# Patient Record
Sex: Female | Born: 1985 | Race: Black or African American | Hispanic: No | Marital: Married | Smoking: Former smoker
Health system: Southern US, Community
[De-identification: ages and names within clinical notes are randomized; demographics above are authoritative.]

## PROBLEM LIST (undated history)

## (undated) DIAGNOSIS — R7303 Prediabetes: Secondary | ICD-10-CM

## (undated) DIAGNOSIS — R519 Headache, unspecified: Secondary | ICD-10-CM

## (undated) DIAGNOSIS — I209 Angina pectoris, unspecified: Secondary | ICD-10-CM

## (undated) DIAGNOSIS — E0590 Thyrotoxicosis, unspecified without thyrotoxic crisis or storm: Secondary | ICD-10-CM

## (undated) DIAGNOSIS — R51 Headache: Secondary | ICD-10-CM

## (undated) DIAGNOSIS — J40 Bronchitis, not specified as acute or chronic: Secondary | ICD-10-CM

## (undated) DIAGNOSIS — J309 Allergic rhinitis, unspecified: Secondary | ICD-10-CM

## (undated) DIAGNOSIS — Z8719 Personal history of other diseases of the digestive system: Secondary | ICD-10-CM

## (undated) DIAGNOSIS — G709 Myoneural disorder, unspecified: Secondary | ICD-10-CM

## (undated) DIAGNOSIS — R0600 Dyspnea, unspecified: Secondary | ICD-10-CM

## (undated) HISTORY — PX: DILATION AND CURETTAGE OF UTERUS: SHX78

## (undated) HISTORY — DX: Allergic rhinitis, unspecified: J30.9

## (undated) HISTORY — PX: WISDOM TOOTH EXTRACTION: SHX21

---

## 2005-04-06 ENCOUNTER — Emergency Department (HOSPITAL_COMMUNITY): Admission: EM | Admit: 2005-04-06 | Discharge: 2005-04-06 | Payer: Self-pay | Admitting: Emergency Medicine

## 2006-08-24 ENCOUNTER — Emergency Department (HOSPITAL_COMMUNITY): Admission: EM | Admit: 2006-08-24 | Discharge: 2006-08-24 | Payer: Self-pay | Admitting: Emergency Medicine

## 2007-04-05 ENCOUNTER — Emergency Department (HOSPITAL_COMMUNITY): Admission: EM | Admit: 2007-04-05 | Discharge: 2007-04-05 | Payer: Self-pay | Admitting: Nurse Practitioner

## 2008-02-29 ENCOUNTER — Emergency Department (HOSPITAL_COMMUNITY): Admission: EM | Admit: 2008-02-29 | Discharge: 2008-02-29 | Payer: Self-pay | Admitting: Emergency Medicine

## 2009-12-13 IMAGING — CR DG CHEST 2V
2 series · 2 of 2 positions shown · non-contrast
Comparison: None available.

CLINICAL DATA: Chest pain.

CHEST - 2 VIEW

[w chest pa]
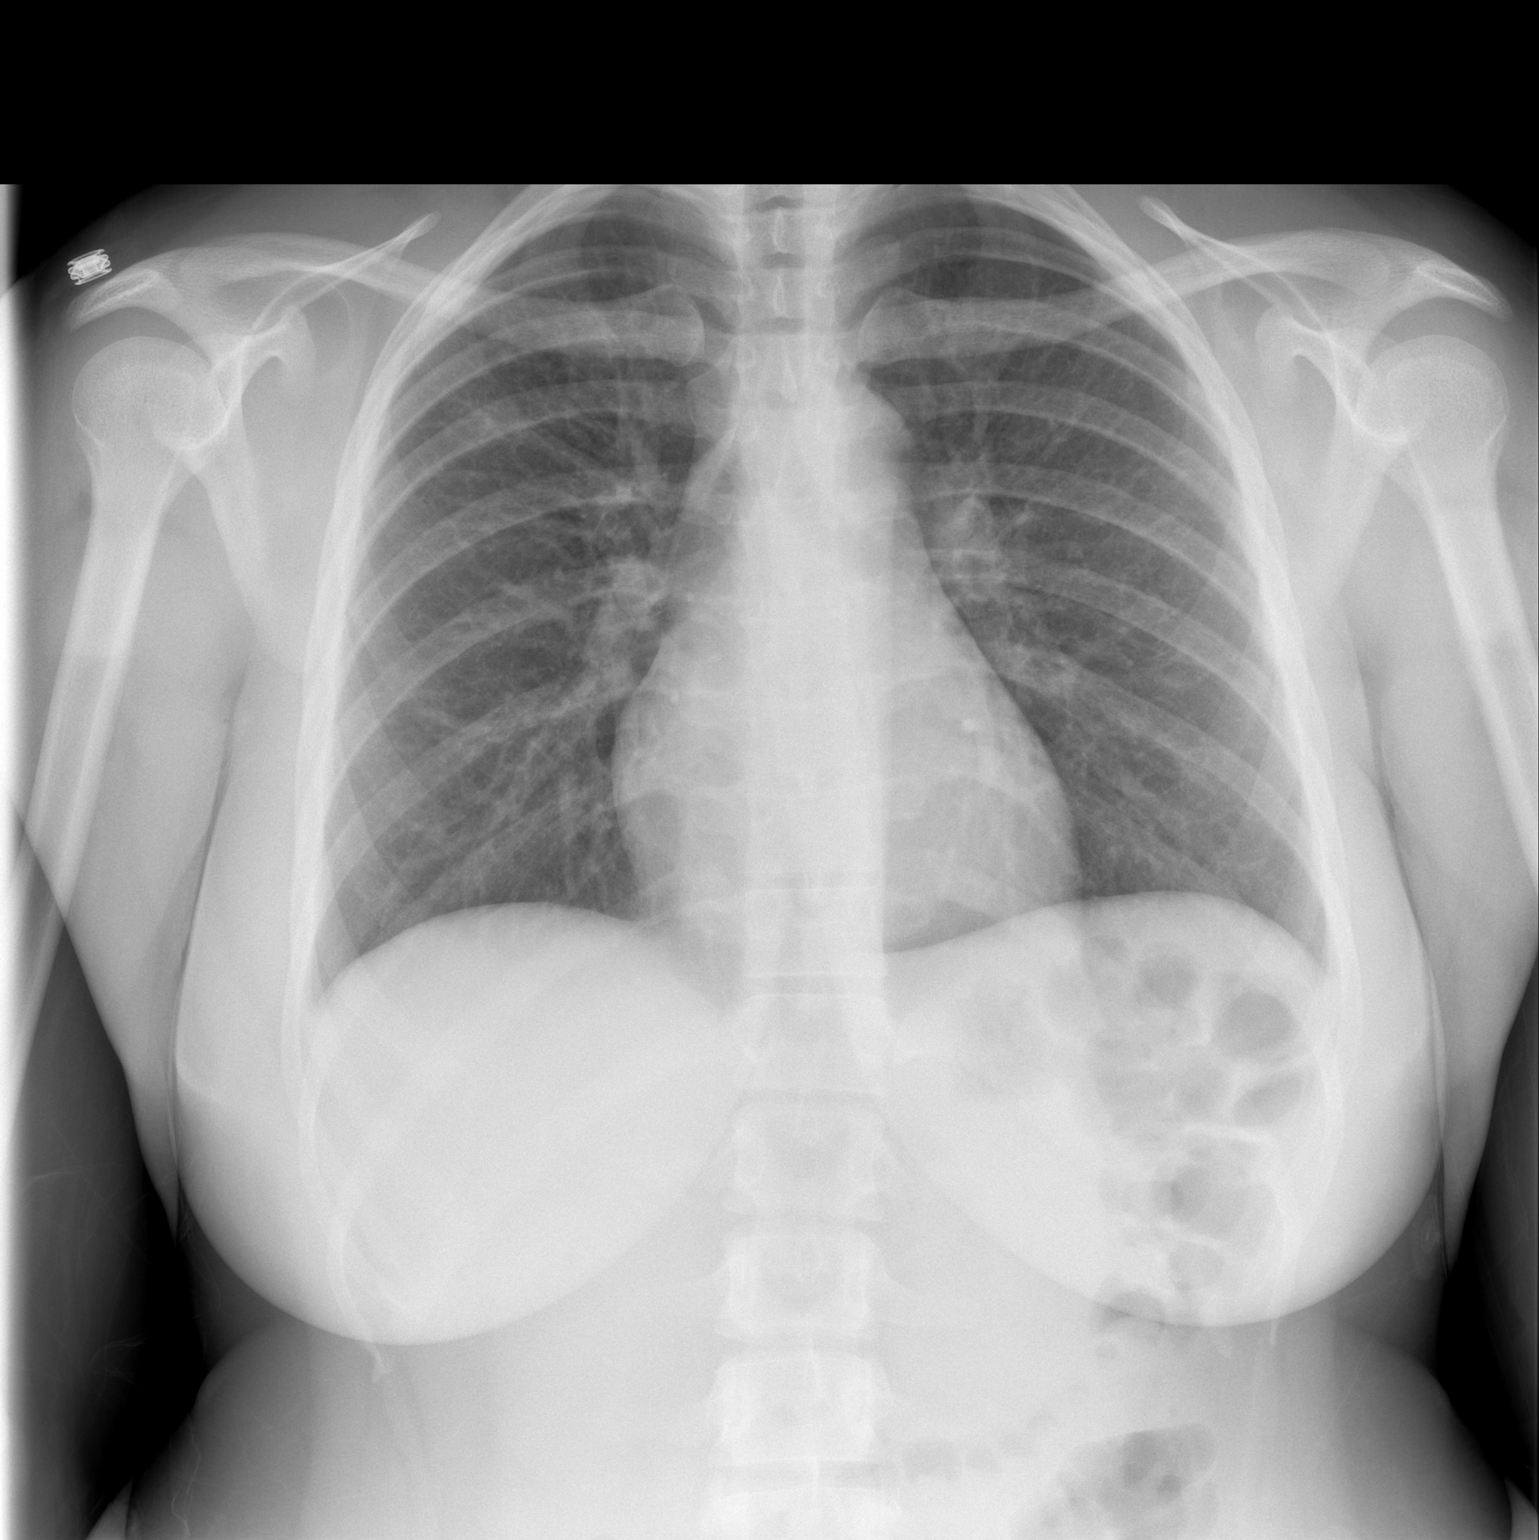

[w chest lat]
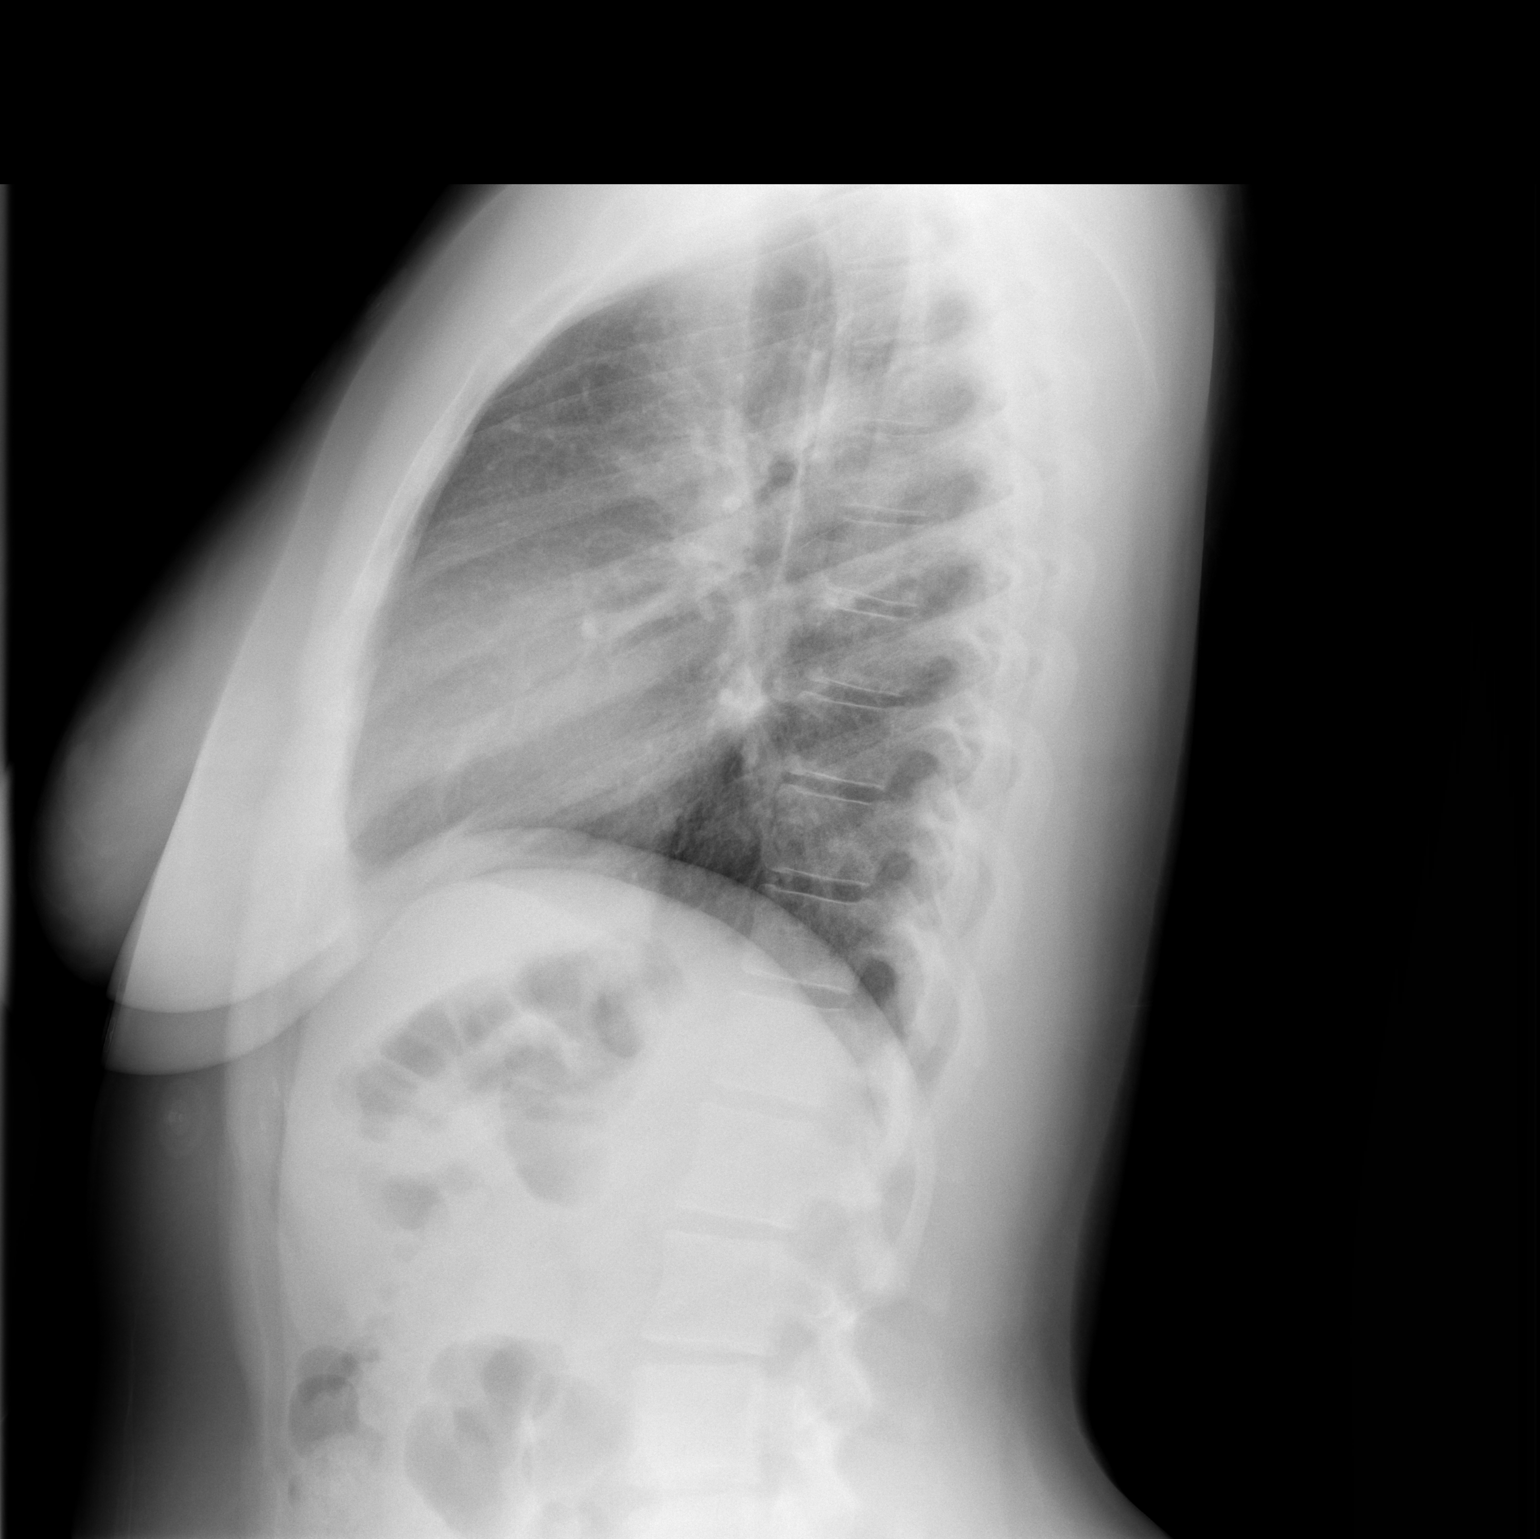

[2 of 2 positions shown; findings below may reference images not displayed]

FINDINGS: The lungs are clear.  Heart size is normal.  There is no
pleural effusion or focal bony abnormality.
IMPRESSION: No acute disease.

## 2010-12-19 LAB — POCT CARDIAC MARKERS
CKMB, poc: <1 ng/mL — ABNORMAL LOW (ref 1.0–8.0)
Myoglobin, poc: 63.9 ng/mL (ref 12–200)
Troponin i, poc: <0.05 ng/mL (ref 0.00–0.09)

## 2010-12-19 LAB — POCT PREGNANCY, URINE: Preg Test, Ur: NEGATIVE

## 2011-01-01 LAB — RAPID STREP SCREEN (MED CTR MEBANE ONLY): Streptococcus, Group A Screen (Direct): NEGATIVE

## 2012-12-09 ENCOUNTER — Emergency Department (HOSPITAL_COMMUNITY)
Admission: EM | Admit: 2012-12-09 | Discharge: 2012-12-09 | Discharge status: Against Medical Advice | Payer: Self-pay | Attending: Emergency Medicine | Admitting: Emergency Medicine

## 2012-12-09 ENCOUNTER — Encounter (HOSPITAL_COMMUNITY): Payer: Self-pay | Admitting: *Deleted

## 2012-12-09 DIAGNOSIS — M79609 Pain in unspecified limb: Secondary | ICD-10-CM | POA: Insufficient documentation

## 2012-12-09 DIAGNOSIS — F17200 Nicotine dependence, unspecified, uncomplicated: Secondary | ICD-10-CM | POA: Insufficient documentation

## 2012-12-09 DIAGNOSIS — IMO0002 Reserved for concepts with insufficient information to code with codable children: Secondary | ICD-10-CM | POA: Insufficient documentation

## 2012-12-09 NOTE — ED Notes (Signed)
The pt has had soreness under her rt arm for 3 weeks.  She thinks it is a boil

## 2012-12-09 NOTE — ED Notes (Signed)
The pt does not answer 

## 2012-12-10 ENCOUNTER — Emergency Department (HOSPITAL_COMMUNITY)
Admission: EM | Admit: 2012-12-10 | Discharge: 2012-12-10 | Disposition: A | Discharge status: Home / Self Care | Payer: Self-pay | Attending: Emergency Medicine | Admitting: Emergency Medicine

## 2012-12-10 ENCOUNTER — Encounter (HOSPITAL_COMMUNITY): Payer: Self-pay | Admitting: Emergency Medicine

## 2012-12-10 DIAGNOSIS — L02411 Cutaneous abscess of right axilla: Secondary | ICD-10-CM

## 2012-12-10 MED ORDER — SULFAMETHOXAZOLE-TRIMETHOPRIM 800-160 MG PO TABS: 1.0000 | ORAL_TABLET | Freq: Two times a day (BID) | ORAL | Status: DC

## 2012-12-10 MED ORDER — LIDOCAINE-EPINEPHRINE (PF) 2 %-1:200000 IJ SOLN
10.0000 mL | Freq: Once | INTRAMUSCULAR | Status: AC
  Administered 2012-12-10: 10 mL via INTRADERMAL
  Filled 2012-12-10: qty 10

## 2012-12-10 MED ORDER — HYDROCODONE-ACETAMINOPHEN 5-325 MG PO TABS: 1.0000 | ORAL_TABLET | ORAL | Status: DC | PRN

## 2012-12-10 NOTE — ED Notes (Signed)
Patient is alert and oriented x3.  She is complaining of right axillary tenderness with a small mass.  She states that she has had the mass for a few weeks.

## 2012-12-10 NOTE — ED Provider Notes (Signed)
CSN: [id number]     Arrival date & time 12/09/12  2356 History   First MD Initiated Contact with Patient 12/10/12 [phone]     Chief Complaint  Patient presents with  . Cyst   HPI  History provided by the patient. Patient is a 27 year old female with no significant PMH presents with complaints of pain and swelling to the right armpit area. She first noticed a slight lump with slight tenderness 3 weeks ago. This gradually increased in size. He did shrink her for a few days but now over the last 4 days has been much worse and more painful. Pain is worse with any palpation. She seen any bleeding or drainage from the area. She denies similar symptoms previously. No fever, chills or sweats. No other treatments used. No other aggravating or alleviating factors. No other associated symptoms.   History reviewed. No pertinent past medical history. History reviewed. No pertinent past surgical history. No family history on file. History  Substance Use Topics  . Smoking status: Current Every Day Smoker -- 2.00 packs/day    Types: Cigars  . Smokeless tobacco: Never Used  . Alcohol Use: Yes     Comment: occasionally   OB History   Grav Para Term Preterm Abortions TAB SAB Ect Mult Living                 Review of Systems  Constitutional: Negative for fever, chills and diaphoresis.  All other systems reviewed and are negative.    Allergies  Review of patient's allergies indicates no known allergies.  Home Medications   Current Outpatient Rx  Name  Route  Sig  Dispense  Refill  . OVER THE COUNTER MEDICATION   Oral   Take 1 tablet by mouth 2 (two) times daily. lipozene dietary supplment         . HYDROcodone-acetaminophen (NORCO/VICODIN) 5-325 MG per tablet   Oral   Take 1-2 tablets by mouth every 4 (four) hours as needed for pain.   10 tablet   0   . sulfamethoxazole-trimethoprim (SEPTRA DS) 800-160 MG per tablet   Oral   Take 1 tablet by mouth 2 (two) times daily.   14 tablet    0    BP 119/77  Pulse 67  Temp(Src) 98.4 F (36.9 C) (Oral)  Resp 18  Ht 5\' 5"  (1.651 m)  Wt 220 lb (99.791 kg)  BMI 36.61 kg/m2  SpO2 100% Physical Exam  Nursing note and vitals reviewed. Constitutional: She is oriented to person, place, and time. She appears well-developed and well-nourished. No distress.  HENT:  Head: Normocephalic.  Cardiovascular: Normal rate and regular rhythm.   Pulmonary/Chest: Effort normal and breath sounds normal.  Musculoskeletal: Normal range of motion.  Neurological: She is alert and oriented to person, place, and time.  Skin: Skin is warm and dry. No rash noted.  2 cm tender nodule to the right axilla.  Psychiatric: She has a normal mood and affect. Her behavior is normal.    ED Course  Procedures  INCISION AND DRAINAGE Performed by: Angus Seller Consent: Verbal consent obtained. Risks and benefits: risks, benefits and alternatives were discussed Type: abscess  Body area: Right axilla  Anesthesia: local infiltration  Incision was made with a scalpel.  Local anesthetic: lidocaine 2% with epinephrine  Anesthetic total: 3 ml  Complexity: complex Blunt dissection to break up loculations  Drainage: purulent  Drainage amount: Small   Packing material: None   Patient tolerance: Patient tolerated the procedure  well with no immediate complications.        MDM   1. Abscess of right axilla    Patient seen and evaluated. Patient well-appearing no acute distress. Symptoms and exam consistent with small abscess of axilla. I&D performed with small amount of drainage. Patient tolerated procedure well.    Angus Seller, PA-C 12/10/12 [phone]

## 2012-12-10 NOTE — ED Notes (Signed)
Pt states that she noticed a lump in her rt arm pit a few weeks ago. Pt states it is painful to touch or when she fully extends her arm. No open area or discharge noted.

## 2012-12-10 NOTE — ED Provider Notes (Signed)
Medical screening examination/treatment/procedure(s) were performed by non-physician practitioner and as supervising physician I was immediately available for consultation/collaboration.  Elbridge Magowan M Kanika Bungert, MD 12/10/12 0655 

## 2013-12-22 ENCOUNTER — Emergency Department (HOSPITAL_COMMUNITY)
Admission: EM | Admit: 2013-12-22 | Discharge: 2013-12-22 | Disposition: A | Discharge status: Home / Self Care | Payer: Self-pay | Attending: Emergency Medicine | Admitting: Emergency Medicine

## 2013-12-22 ENCOUNTER — Encounter (HOSPITAL_COMMUNITY): Payer: Self-pay | Admitting: Emergency Medicine

## 2013-12-22 DIAGNOSIS — Z79899 Other long term (current) drug therapy: Secondary | ICD-10-CM | POA: Insufficient documentation

## 2013-12-22 DIAGNOSIS — G5601 Carpal tunnel syndrome, right upper limb: Secondary | ICD-10-CM | POA: Insufficient documentation

## 2013-12-22 DIAGNOSIS — G5602 Carpal tunnel syndrome, left upper limb: Secondary | ICD-10-CM

## 2013-12-22 DIAGNOSIS — Z792 Long term (current) use of antibiotics: Secondary | ICD-10-CM | POA: Insufficient documentation

## 2013-12-22 DIAGNOSIS — Z720 Tobacco use: Secondary | ICD-10-CM | POA: Insufficient documentation

## 2013-12-22 LAB — CBG MONITORING, ED: GLUCOSE-CAPILLARY: 87 mg/dL (ref 70–99)

## 2013-12-22 MED ORDER — HYDROCODONE-ACETAMINOPHEN 5-325 MG PO TABS: 1.0000 | ORAL_TABLET | Freq: Four times a day (QID) | ORAL | Status: DC | PRN

## 2013-12-22 NOTE — ED Notes (Signed)
CBG 87. 

## 2013-12-22 NOTE — Discharge Instructions (Signed)
Carpal Tunnel Syndrome The carpal tunnel is a narrow area located on the palm side of your wrist. The tunnel is formed by the wrist bones and ligaments. Nerves, blood vessels, and tendons pass through the carpal tunnel. Repeated wrist motion or certain diseases may cause swelling within the tunnel. This swelling pinches the main nerve in the wrist (median nerve) and causes the painful hand and arm condition called carpal tunnel syndrome. CAUSES   Repeated wrist motions.  Wrist injuries.  Certain diseases like arthritis, diabetes, alcoholism, hyperthyroidism, and kidney failure.  Obesity.  Pregnancy. SYMPTOMS   A "pins and needles" feeling in your fingers or hand, especially in your thumb, index and middle fingers.  Tingling or numbness in your fingers or hand.  An aching feeling in your entire arm, especially when your wrist and elbow are bent for long periods of time.  Wrist pain that goes up your arm to your shoulder.  Pain that goes down into your palm or fingers.  A weak feeling in your hands. DIAGNOSIS  Your health care provider will take your history and perform a physical exam. An electromyography test may be needed. This test measures electrical signals sent out by your nerves into the muscles. The electrical signals are usually slowed by carpal tunnel syndrome. You may also need X-rays. TREATMENT  Carpal tunnel syndrome may clear up by itself. Your health care provider may recommend a wrist splint or medicine such as a nonsteroidal anti-inflammatory medicine. Cortisone injections may help. Sometimes, surgery may be needed to free the pinched nerve.  HOME CARE INSTRUCTIONS   Take all medicine as directed by your health care provider. Only take over-the-counter or prescription medicines for pain, discomfort, or fever as directed by your health care provider.  If you were given a splint to keep your wrist from bending, wear it as directed. It is important to wear the splint at  night. Wear the splint for as long as you have pain or numbness in your hand, arm, or wrist. This may take 1 to 2 months.  Rest your wrist from any activity that may be causing your pain. If your symptoms are work-related, you may need to talk to your employer about changing to a job that does not require using your wrist.  Put ice on your wrist after long periods of wrist activity.  Put ice in a plastic bag.  Place a towel between your skin and the bag.  Leave the ice on for 15-20 minutes, 03-04 times a day.  Keep all follow-up visits as directed by your health care provider. This includes any orthopedic referrals, physical therapy, and rehabilitation. Any delay in getting necessary care could result in a delay or failure of your condition to heal. SEEK IMMEDIATE MEDICAL CARE IF:   You have new, unexplained symptoms.  Your symptoms get worse and are not helped or controlled with medicines. MAKE SURE YOU:   Understand these instructions.  Will watch your condition.  Will get help right away if you are not doing well or get worse. Document Released: 02/28/2000 Document Revised: 07/17/2013 Document Reviewed: 01/16/2011 [employer] Patient Information 2015 [employer]. This information is not intended to replace advice given to you by your health care provider. Make sure you discuss any questions you have with your health care provider.  

## 2013-12-22 NOTE — ED Provider Notes (Signed)
Medical screening examination/treatment/procedure(s) were performed by non-physician practitioner and as supervising physician I was immediately available for consultation/collaboration.  Richarda Blade, MD 12/22/13 [phone]

## 2013-12-22 NOTE — ED Notes (Addendum)
Pt from home c/o L hand, L arm and L foot numbness. L foot pain x 2 months, L arm and hand numb x2 days. Pt sts that she wole up with the hand and arm numbness. Pt denies injury. Pt is A&O and in NAD

## 2013-12-22 NOTE — ED Provider Notes (Signed)
CSN: [id number]     Arrival date & time 12/22/13  1305 History   This chart was scribed for non-physician practitioner, Glendell Docker, NP working with Richarda Blade, MD, by Erling Conte, ED Scribe. This patient was seen in room WTR6/WTR6 and the patient's care was started at 1:38 PM.    Chief Complaint  Patient presents with  . Numbness    L hand, arm and foot    The history is provided by the patient. No language interpreter was used.   HPI Comments: [name] [name] is a 28 y.o. female who presents to the Emergency Department complaining of a tingling sensation down her left arm. Pt is unable to specify if the tingling is coming from her neck throughout her arm or originating from her wrist up her arm. She denies any injury to her arm. She states that the pain twists with her arm. She is having associated mild neck pain. Never been diagnosed with DM but states she is pre diabetic based on recent blood work through her job. She denies any numbness, weakness, swelling or color change. 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  She is also having a stabbing sensation in the top of her left foot. She states walking helps alleviate the pain but sitting back down at a desk causes it to hurt. She has had swelling of her feet previously but states it is not often. Believes it could be due to her shoes.    History reviewed. No pertinent past medical history. History reviewed. No pertinent past surgical history. No family history on file. History  Substance Use Topics  . Smoking status: Current Every Day Smoker -- 2.00 packs/day    Types: Cigars  . Smokeless tobacco: Never Used  . Alcohol Use: Yes     Comment: occasionally   OB History   Grav Para Term Preterm Abortions TAB SAB Ect Mult Living                 Review of Systems  Musculoskeletal: Positive for myalgias (left arm and left foot with tingling). Negative for gait problem  and joint swelling.  Skin: Negative for color change.  Neurological: Negative for weakness and numbness.  All other systems reviewed and are negative.     Allergies  Review of patient's allergies indicates no known allergies.  Home Medications   Prior to Admission medications   Medication Sig Start Date End Date Taking? Authorizing Provider  HYDROcodone-acetaminophen (NORCO/VICODIN) 5-325 MG per tablet Take 1-2 tablets by mouth every 4 (four) hours as needed for pain. 12/10/12   Ruthell Rummage Dammen, PA-C  OVER THE COUNTER MEDICATION Take 1 tablet by mouth 2 (two) times daily. lipozene dietary supplment    Historical Provider, MD  sulfamethoxazole-trimethoprim (SEPTRA DS) 800-160 MG per tablet Take 1 tablet by mouth 2 (two) times daily. 12/10/12   Martie Lee, PA-C   Triage Vitals: BP 132/71  Pulse 64  Temp(Src) 98.2 F (36.8 C) (Oral)  Resp 16  SpO2 100%  Physical Exam  Nursing note and vitals reviewed. Constitutional: She is oriented to person, place, and time. She appears well-developed and well-nourished. No distress.  HENT:  Head: Normocephalic and atraumatic.  Eyes: Conjunctivae and EOM are normal.  Neck: Neck supple. No tracheal deviation present.  Cardiovascular: Normal rate.   Pulmonary/Chest: Effort normal. No respiratory distress.  Musculoskeletal: Normal range of motion.  Left ankle: Normal. No tenderness. No lateral malleolus tenderness found.       Cervical back: She exhibits no tenderness and no bony tenderness.       Left foot: Normal. She exhibits normal range of motion, no tenderness and no bony tenderness.  Full rom, pulses intact.   Neurological: She is alert and oriented to person, place, and time. She exhibits normal muscle tone. Coordination normal.  Skin: Skin is warm and dry.  Psychiatric: She has a normal mood and affect. Her behavior is normal.    ED Course  Procedures (including critical care time)  DIAGNOSTIC STUDIES: Oxygen Saturation is  100% on RA, normal by my interpretation.    COORDINATION OF CARE: 1:43 PM- Will order splint wrist application and discharge pt with an antiinflammatory. Advised pt to follow up with orthopedist if the pain continues. Pt advised of plan for treatment and pt agrees.    Labs Review Labs Reviewed - No data to display  Imaging Review No results found.   EKG Interpretation None      MDM   Final diagnoses:  Carpal tunnel syndrome of left wrist    Pt splinted for comfort and given ortho follow up. Neurovascularly intact  I personally performed the services described in this documentation, which was scribed in my presence. The recorded information has been reviewed and is accurate.     Glendell Docker, NP 12/22/13 1417

## 2014-01-17 ENCOUNTER — Encounter (HOSPITAL_COMMUNITY): Payer: Self-pay | Admitting: *Deleted

## 2014-01-17 ENCOUNTER — Emergency Department (HOSPITAL_COMMUNITY)
Admission: EM | Admit: 2014-01-17 | Discharge: 2014-01-17 | Disposition: A | Discharge status: Home / Self Care | Payer: Self-pay | Attending: Emergency Medicine | Admitting: Emergency Medicine

## 2014-01-17 DIAGNOSIS — Z79899 Other long term (current) drug therapy: Secondary | ICD-10-CM | POA: Insufficient documentation

## 2014-01-17 DIAGNOSIS — B86 Scabies: Secondary | ICD-10-CM | POA: Insufficient documentation

## 2014-01-17 DIAGNOSIS — Z720 Tobacco use: Secondary | ICD-10-CM | POA: Insufficient documentation

## 2014-01-17 DIAGNOSIS — Z792 Long term (current) use of antibiotics: Secondary | ICD-10-CM | POA: Insufficient documentation

## 2014-01-17 MED ORDER — PERMETHRIN 5 % EX CREA: TOPICAL_CREAM | CUTANEOUS | Status: DC

## 2014-01-17 NOTE — ED Provider Notes (Signed)
CSN: [id number]     Arrival date & time 01/17/14  1038 History   First MD Initiated Contact with Patient 01/17/14 1044     Chief Complaint  Patient presents with  . Insect Bite     (Consider location/radiation/quality/duration/timing/severity/associated sxs/prior Treatment) HPI Comments: This is a 28 year old female who presents to the emergency department complaining of a rash to her arms and chest 1 week. Patient reports the rash is very itchy. She has tried applying an over-the-counter topical cream with no relief. No contacts with similar rash. Denies any new soaps, detergents, medications, recent travel. States she has not slept in anybody else's home. Denies difficulty breathing or swallowing. No known allergies.  The history is provided by the patient.    History reviewed. No pertinent past medical history. History reviewed. No pertinent past surgical history. History reviewed. No pertinent family history. History  Substance Use Topics  . Smoking status: Current Every Day Smoker -- 2.00 packs/day    Types: Cigars  . Smokeless tobacco: Never Used  . Alcohol Use: Yes     Comment: occasionally   OB History    No data available     Review of Systems  Skin: Positive for rash.  All other systems reviewed and are negative.     Allergies  Review of patient's allergies indicates no known allergies.  Home Medications   Prior to Admission medications   Medication Sig Start Date End Date Taking? Authorizing Provider  HYDROcodone-acetaminophen (NORCO/VICODIN) 5-325 MG per tablet Take 1-2 tablets by mouth every 4 (four) hours as needed for pain. 12/10/12   Martie Lee, PA-C  HYDROcodone-acetaminophen (NORCO/VICODIN) 5-325 MG per tablet Take 1-2 tablets by mouth every 6 (six) hours as needed. 12/22/13   Glendell Docker, NP  OVER THE COUNTER MEDICATION Take 1 tablet by mouth 2 (two) times daily. lipozene dietary supplment    Historical Provider, MD  permethrin (ELIMITE) 5 %  cream Apply to affected area once 01/17/14   Rodrigo Mcgranahan M Javeah Loeza, PA-C  sulfamethoxazole-trimethoprim (SEPTRA DS) 800-160 MG per tablet Take 1 tablet by mouth 2 (two) times daily. 12/10/12   Ruthell Rummage Dammen, PA-C   BP 114/72 mmHg  Pulse 74  Temp(Src) 98.9 F (37.2 C) (Oral)  Resp 16  SpO2 95% Physical Exam  Constitutional: She is oriented to person, place, and time. She appears well-developed and well-nourished. No distress.  HENT:  Head: Normocephalic and atraumatic.  Mouth/Throat: Oropharynx is clear and moist.  Eyes: Conjunctivae and EOM are normal.  Neck: Normal range of motion. Neck supple.  Cardiovascular: Normal rate, regular rhythm and normal heart sounds.   Pulmonary/Chest: Effort normal and breath sounds normal. No respiratory distress.  Musculoskeletal: Normal range of motion. She exhibits no edema.  Neurological: She is alert and oriented to person, place, and time. No sensory deficit.  Skin: Skin is warm and dry.  Few small areas of erythema and burrowing with excoriations on bilateral arms and one area on chest. Spares palms of hands and soles of feet. Rash consistent with scabies. No oral lesions.  Psychiatric: She has a normal mood and affect. Her behavior is normal.  Nursing note and vitals reviewed.   ED Course  Procedures (including critical care time) Labs Review Labs Reviewed - No data to display  Imaging Review No results found.   EKG Interpretation None      MDM   Final diagnoses:  Scabies   Rash consistent with scabies. Infection care and precautions discussed. Treat with permethrin cream.  Stable for discharge. Return precautions given. Patient states understanding of treatment care plan and is agreeable.   Carman Ching, PA-C 01/17/14 [place], MD 01/18/14 [phone]

## 2014-01-17 NOTE — Discharge Instructions (Signed)
Use permethrin cream as directed, apply from your shoulders down, leave on for 8-12 hours and rinse off.  Scabies Scabies are small bugs (mites) that burrow under the skin and cause red bumps and severe itching. These bugs can only be seen with a microscope. Scabies are highly contagious. They can spread easily from person to person by direct contact. They are also spread through sharing clothing or linens that have the scabies mites living in them. It is not unusual for an entire family to become infected through shared towels, clothing, or bedding.  HOME CARE INSTRUCTIONS   Your caregiver may prescribe a cream or lotion to kill the mites. If cream is prescribed, massage the cream into the entire body from the neck to the bottom of both feet. Also massage the cream into the scalp and face if your child is less than 22 year old. Avoid the eyes and mouth. Do not wash your hands after application.  Leave the cream on for 8 to 12 hours. Your child should bathe or shower after the 8 to 12 hour application period. Sometimes it is helpful to apply the cream to your child right before bedtime.  One treatment is usually effective and will eliminate approximately 95% of infestations. For severe cases, your caregiver may decide to repeat the treatment in 1 week. Everyone in your household should be treated with one application of the cream.  New rashes or burrows should not appear within 24 to 48 hours after successful treatment. However, the itching and rash may last for 2 to 4 weeks after successful treatment. Your caregiver may prescribe a medicine to help with the itching or to help the rash go away more quickly.  Scabies can live on clothing or linens for up to 3 days. All of your child's recently used clothing, towels, stuffed toys, and bed linens should be washed in hot water and then dried in a dryer for at least 20 minutes on high heat. Items that cannot be washed should be enclosed in a plastic bag for  at least 3 days.  To help relieve itching, bathe your child in a cool bath or apply cool washcloths to the affected areas.  Your child may return to school after treatment with the prescribed cream. SEEK MEDICAL CARE IF:   The itching persists longer than 4 weeks after treatment.  The rash spreads or becomes infected. Signs of infection include red blisters or yellow-tan crust. Document Released: 03/02/2005 Document Revised: 05/25/2011 Document Reviewed: 07/11/2008 [facility] Patient Information 2015 [place], [place]. This information is not intended to replace advice given to you by your health care provider. Make sure you discuss any questions you have with your health care provider.

## 2015-01-29 ENCOUNTER — Emergency Department (HOSPITAL_COMMUNITY)
Admission: EM | Admit: 2015-01-29 | Discharge: 2015-01-29 | Disposition: A | Discharge status: Home / Self Care | Payer: Self-pay | Attending: Emergency Medicine | Admitting: Emergency Medicine

## 2015-01-29 ENCOUNTER — Encounter (HOSPITAL_COMMUNITY): Payer: Self-pay | Admitting: Family Medicine

## 2015-01-29 DIAGNOSIS — Z792 Long term (current) use of antibiotics: Secondary | ICD-10-CM | POA: Insufficient documentation

## 2015-01-29 DIAGNOSIS — Z79899 Other long term (current) drug therapy: Secondary | ICD-10-CM | POA: Insufficient documentation

## 2015-01-29 DIAGNOSIS — J019 Acute sinusitis, unspecified: Secondary | ICD-10-CM | POA: Insufficient documentation

## 2015-01-29 DIAGNOSIS — F17210 Nicotine dependence, cigarettes, uncomplicated: Secondary | ICD-10-CM | POA: Insufficient documentation

## 2015-01-29 LAB — RAPID STREP SCREEN (MED CTR MEBANE ONLY): STREPTOCOCCUS, GROUP A SCREEN (DIRECT): NEGATIVE

## 2015-01-29 MED ORDER — LORATADINE 10 MG PO TABS: 10.0000 mg | ORAL_TABLET | Freq: Every day | ORAL | Status: DC

## 2015-01-29 MED ORDER — SALINE SPRAY 0.65 % NA SOLN: 1.0000 | NASAL | Status: DC | PRN

## 2015-01-29 MED ORDER — FLUTICASONE PROPIONATE 50 MCG/ACT NA SUSP: 2.0000 | Freq: Every day | NASAL | Status: DC

## 2015-01-29 MED ORDER — AMOXICILLIN-POT CLAVULANATE 875-125 MG PO TABS: 1.0000 | ORAL_TABLET | Freq: Two times a day (BID) | ORAL | Status: DC

## 2015-01-29 MED ORDER — DEXAMETHASONE SODIUM PHOSPHATE 10 MG/ML IJ SOLN
10.0000 mg | Freq: Once | INTRAMUSCULAR | Status: AC
  Administered 2015-01-29: 10 mg via INTRAMUSCULAR
  Filled 2015-01-29: qty 1

## 2015-01-29 NOTE — Discharge Instructions (Signed)
Your rapid strep screen is negative. Take Augmentin as prescribed for symptoms. Also resume taking Claritin. You may use Flonase as prescribed for congestion. Use saline nasal spray for additional control of congestion as needed. Follow-up with a primary care doctor if symptoms persist.  Sinusitis, Adult Sinusitis is redness, soreness, and inflammation of the paranasal sinuses. Paranasal sinuses are air pockets within the bones of your face. They are located beneath your eyes, in the middle of your forehead, and above your eyes. In healthy paranasal sinuses, mucus is able to drain out, and air is able to circulate through them by way of your nose. However, when your paranasal sinuses are inflamed, mucus and air can become trapped. This can allow bacteria and other germs to grow and cause infection. Sinusitis can develop quickly and last only a short time (acute) or continue over a long period (chronic). Sinusitis that lasts for more than 12 weeks is considered chronic. CAUSES Causes of sinusitis include:  Allergies.  Structural abnormalities, such as displacement of the cartilage that separates your nostrils (deviated septum), which can decrease the air flow through your nose and sinuses and affect sinus drainage.  Functional abnormalities, such as when the small hairs (cilia) that line your sinuses and help remove mucus do not work properly or are not present. SIGNS AND SYMPTOMS Symptoms of acute and chronic sinusitis are the same. The primary symptoms are pain and pressure around the affected sinuses. Other symptoms include:  Upper toothache.  Earache.  Headache.  Bad breath.  Decreased sense of smell and taste.  A cough, which worsens when you are lying flat.  Fatigue.  Fever.  Thick drainage from your nose, which often is green and may contain pus (purulent).  Swelling and warmth over the affected sinuses. DIAGNOSIS Your health care provider will perform a physical exam.  During your exam, your health care provider may perform any of the following to help determine if you have acute sinusitis or chronic sinusitis:  Look in your nose for signs of abnormal growths in your nostrils (nasal polyps).  Tap over the affected sinus to check for signs of infection.  View the inside of your sinuses using an imaging device that has a light attached (endoscope). If your health care provider suspects that you have chronic sinusitis, one or more of the following tests may be recommended:  Allergy tests.  Nasal culture. A sample of mucus is taken from your nose, sent to a lab, and screened for bacteria.  Nasal cytology. A sample of mucus is taken from your nose and examined by your health care provider to determine if your sinusitis is related to an allergy. TREATMENT Most cases of acute sinusitis are related to a viral infection and will resolve on their own within 10 days. Sometimes, medicines are prescribed to help relieve symptoms of both acute and chronic sinusitis. These may include pain medicines, decongestants, nasal steroid sprays, or saline sprays. However, for sinusitis related to a bacterial infection, your health care provider will prescribe antibiotic medicines. These are medicines that will help kill the bacteria causing the infection. Rarely, sinusitis is caused by a fungal infection. In these cases, your health care provider will prescribe antifungal medicine. For some cases of chronic sinusitis, surgery is needed. Generally, these are cases in which sinusitis recurs more than 3 times per year, despite other treatments. HOME CARE INSTRUCTIONS  Drink plenty of water. Water helps thin the mucus so your sinuses can drain more easily.  Use a humidifier.  Inhale steam 3-4 times a day (for example, sit in the bathroom with the shower running).  Apply a warm, moist washcloth to your face 3-4 times a day, or as directed by your health care provider.  Use saline  nasal sprays to help moisten and clean your sinuses.  Take medicines only as directed by your health care provider.  If you were prescribed either an antibiotic or antifungal medicine, finish it all even if you start to feel better. SEEK IMMEDIATE MEDICAL CARE IF:  You have increasing pain or severe headaches.  You have nausea, vomiting, or drowsiness.  You have swelling around your face.  You have vision problems.  You have a stiff neck.  You have difficulty breathing.   This information is not intended to replace advice given to you by your health care provider. Make sure you discuss any questions you have with your health care provider.   Document Released: 03/02/2005 Document Revised: 03/23/2014 Document Reviewed: 03/17/2011 Elsevier Interactive Patient Education [employer].

## 2015-01-29 NOTE — ED Provider Notes (Signed)
CSN: [id number]     Arrival date & time 01/29/15  1948 History   First MD Initiated Contact with Patient 01/29/15 2053     Chief Complaint  Patient presents with  . URI     (Consider location/radiation/quality/duration/timing/severity/associated sxs/prior Treatment) Patient is a 29 y.o. female presenting with URI.  URI Presenting symptoms: congestion, cough and sore throat   Presenting symptoms: no ear pain, no facial pain, no fever and no rhinorrhea   Congestion:    Location:  Nasal (x7 months)   Interferes with sleep: no     Interferes with eating/drinking: no   Cough:    Cough characteristics:  Dry   Severity:  Mild   Onset quality:  Gradual   Duration:  3 days   Timing:  Sporadic   Progression:  Waxing and waning   Chronicity:  New Sore throat:    Severity:  Mild   Onset quality:  Gradual   Duration:  3 days   Timing:  Constant   Progression:  Worsening Severity:  Mild Onset quality:  Gradual Duration:  3 days Timing:  Constant Progression:  Worsening Chronicity:  New Relieved by:  Nothing Ineffective treatments:  None tried Associated symptoms: no sinus pain, no sneezing and no wheezing   Risk factors: recent travel (Just returned from [place])   Risk factors: no chronic respiratory disease, no immunosuppression and no sick contacts (None known, but potential sick persons on cruise)     History reviewed. No pertinent past medical history. History reviewed. No pertinent past surgical history. History reviewed. No pertinent family history. Social History  Substance Use Topics  . Smoking status: Current Some Day Smoker -- 0.00 packs/day    Types: Cigars  . Smokeless tobacco: Never Used  . Alcohol Use: Yes     Comment: Once or twice a month   OB History    No data available      Review of Systems  Constitutional: Negative for fever.  HENT: Positive for congestion and sore throat. Negative for drooling, ear pain, rhinorrhea, sinus pressure,  sneezing and trouble swallowing.   Respiratory: Positive for cough. Negative for wheezing.   All other systems reviewed and are negative.   Allergies  Review of patient's allergies indicates no known allergies.  Home Medications   Prior to Admission medications   Medication Sig Start Date End Date Taking? Authorizing Provider  amoxicillin-clavulanate (AUGMENTIN) 875-125 MG tablet Take 1 tablet by mouth every 12 (twelve) hours. 01/29/15   Antonietta Breach, PA-C  fluticasone (FLONASE) 50 MCG/ACT nasal spray Place 2 sprays into both nostrils daily. 01/29/15   Antonietta Breach, PA-C  HYDROcodone-acetaminophen (NORCO/VICODIN) 5-325 MG per tablet Take 1-2 tablets by mouth every 4 (four) hours as needed for pain. 12/10/12   Hazel Sams, PA-C  HYDROcodone-acetaminophen (NORCO/VICODIN) 5-325 MG per tablet Take 1-2 tablets by mouth every 6 (six) hours as needed. 12/22/13   Glendell Docker, NP  loratadine (CLARITIN) 10 MG tablet Take 1 tablet (10 mg total) by mouth daily. 01/29/15   Antonietta Breach, PA-C  OVER THE COUNTER MEDICATION Take 1 tablet by mouth 2 (two) times daily. lipozene dietary supplment    Historical Provider, MD  permethrin (ELIMITE) 5 % cream Apply to affected area once 01/17/14   Robyn M Hess, PA-C  sodium chloride (OCEAN) 0.65 % SOLN nasal spray Place 1 spray into both nostrils as needed for congestion. 01/29/15   Antonietta Breach, PA-C  sulfamethoxazole-trimethoprim (SEPTRA DS) 800-160 MG per tablet Take 1 tablet by  mouth 2 (two) times daily. 12/10/12   Peter Dammen, PA-C   BP 124/72 mmHg  Pulse 71  Temp(Src) 98.3 F (36.8 C) (Oral)  Resp 18  Ht 5\' 5"  (1.651 m)  Wt 212 lb (96.163 kg)  BMI 35.28 kg/m2  SpO2 100%   Physical Exam  Constitutional: She is oriented to person, place, and time. She appears well-developed and well-nourished. No distress.  Nontoxic/nonseptic appearing  HENT:  Head: Normocephalic and atraumatic.  Right Ear: Tympanic membrane, external ear and ear canal normal.  Left  Ear: Tympanic membrane, external ear and ear canal normal.  Nose: Mucosal edema present. No septal deviation or nasal septal hematoma.  Mouth/Throat: Uvula is midline and mucous membranes are normal. Posterior oropharyngeal erythema present. No oropharyngeal exudate or posterior oropharyngeal edema.  Eyes: Conjunctivae and EOM are normal. Pupils are equal, round, and reactive to light. No scleral icterus.  Neck: Normal range of motion.  No nuchal rigidity or meningismus  Cardiovascular: Normal rate, regular rhythm and intact distal pulses.   Pulmonary/Chest: Effort normal. No respiratory distress. She has no wheezes. She has no rales.  Respirations even and unlabored. Lungs clear.  Musculoskeletal: Normal range of motion.  Neurological: She is alert and oriented to person, place, and time. She exhibits normal muscle tone. Coordination normal.  GCS 15. Speech is goal oriented.  Skin: Skin is warm and dry. No rash noted. She is not diaphoretic. No erythema. No pallor.  Psychiatric: She has a normal mood and affect. Her behavior is normal.  Nursing note and vitals reviewed.   ED Course  Procedures (including critical care time) Labs Review Labs Reviewed  RAPID STREP SCREEN (NOT AT [facility])  CULTURE, GROUP A STREP    Imaging Review No results found.   I have personally reviewed and evaluated these images and lab results as part of my medical decision-making.   EKG Interpretation None      MDM   Final diagnoses:  Subacute sinusitis, unspecified location    Patient complaining of symptoms of sinusitis. Congestion has been present x 7 months, however sore throat and cough has been ongoing x 3 days since returning from a cruise. Given chronicity of congestion with symptom worsening, will cover for bacterial rhinosinusitis. Patient discharged with Augmentin. Instructions given for warm saline nasal wash and recommendations for follow-up with primary care physician. Return precautions  given at discharge. Patient agreeable to plan with no unaddressed concerns. Patient discharged in good condition.   Filed Vitals:   01/29/15 1958  BP: 124/72  Pulse: 71  Temp: 98.3 F (36.8 C)  TempSrc: Oral  Resp: 18  Height: 5\' 5"  (1.651 m)  Weight: 212 lb (96.163 kg)  SpO2: 100%     Antonietta Breach, PA-C 01/29/15 2201  Orlie Dakin, MD 01/30/15 0003

## 2015-01-29 NOTE — ED Notes (Signed)
Patient reports she developed a sore throat on Sunday and horsiness yesterday. Also, reports a nasal congestion for the last 7 months. Denies fever.

## 2015-02-01 LAB — CULTURE, GROUP A STREP

## 2015-08-26 ENCOUNTER — Emergency Department (HOSPITAL_COMMUNITY)
Admission: EM | Admit: 2015-08-26 | Discharge: 2015-08-26 | Disposition: A | Discharge status: Home / Self Care | Payer: 59 | Attending: Emergency Medicine | Admitting: Emergency Medicine

## 2015-08-26 ENCOUNTER — Encounter (HOSPITAL_COMMUNITY): Payer: Self-pay | Admitting: *Deleted

## 2015-08-26 ENCOUNTER — Emergency Department (HOSPITAL_COMMUNITY): Payer: 59

## 2015-08-26 DIAGNOSIS — K219 Gastro-esophageal reflux disease without esophagitis: Secondary | ICD-10-CM | POA: Insufficient documentation

## 2015-08-26 DIAGNOSIS — F17210 Nicotine dependence, cigarettes, uncomplicated: Secondary | ICD-10-CM | POA: Diagnosis not present

## 2015-08-26 DIAGNOSIS — R0602 Shortness of breath: Secondary | ICD-10-CM | POA: Insufficient documentation

## 2015-08-26 DIAGNOSIS — R079 Chest pain, unspecified: Secondary | ICD-10-CM

## 2015-08-26 DIAGNOSIS — R42 Dizziness and giddiness: Secondary | ICD-10-CM | POA: Diagnosis present

## 2015-08-26 DIAGNOSIS — Z79899 Other long term (current) drug therapy: Secondary | ICD-10-CM | POA: Diagnosis not present

## 2015-08-26 LAB — BASIC METABOLIC PANEL
Anion gap: 5 (ref 5–15)
BUN: 9 mg/dL (ref 6–20)
CHLORIDE: 105 mmol/L (ref 101–111)
CO2: 28 mmol/L (ref 22–32)
Calcium: 9.7 mg/dL (ref 8.9–10.3)
Creatinine, Ser: 0.81 mg/dL (ref 0.44–1.00)
GFR calc non Af Amer: >60 mL/min (ref >60)
Glucose, Bld: 101 mg/dL — ABNORMAL HIGH (ref 65–99)
POTASSIUM: 4.4 mmol/L (ref 3.5–5.1)
SODIUM: 138 mmol/L (ref 135–145)

## 2015-08-26 LAB — CBC
HEMATOCRIT: 39.9 % (ref 36.0–46.0)
Hemoglobin: 13.1 g/dL (ref 12.0–15.0)
MCH: 27.7 pg (ref 26.0–34.0)
MCHC: 32.8 g/dL (ref 30.0–36.0)
MCV: 84.4 fL (ref 78.0–100.0)
PLATELETS: 381 10*3/uL (ref 150–400)
RBC: 4.73 MIL/uL (ref 3.87–5.11)
RDW: 14.1 % (ref 11.5–15.5)
WBC: 8.6 10*3/uL (ref 4.0–10.5)

## 2015-08-26 LAB — I-STAT TROPONIN, ED: Troponin i, poc: 0 ng/mL (ref 0.00–0.08)

## 2015-08-26 NOTE — ED Notes (Signed)
Pt says for several days she has had dizziness and chest tightness. She reports she first gets dizzy and then has onset of chest discomfort.

## 2015-08-26 NOTE — ED Notes (Signed)
Bed: [id number] Expected date:  Expected time:  Means of arrival:  Comments: Pt still in room

## 2015-08-26 NOTE — ED Provider Notes (Signed)
CSN: [id number]     Arrival date & time 08/26/15  1830 History   First MD Initiated Contact with Patient 08/26/15 2059     Chief Complaint  Patient presents with  . Dizziness     (Consider location/radiation/quality/duration/timing/severity/associated sxs/prior Treatment) HPI  Patient is a 30 yo F with no significant PMHx presenting to the ED complaining of lighheadedness, chest tightness, and SOB for the past 4 days. Patient reports feeling lightheaded when going from squatting to standing position. States after her lightheadedness resolves she experiences "sqeezing" substernal, non-radiating substernal chest pain and SOB even at rest. Symptoms last for a few hours each time. Denies having any associated palpitations or diaphoresis. Reports having sharp substernal chest pain several times in the past "because I was ovulating." Also reports having chest pain and a bitter taste in her mouth whenever she eats "too much." Denies having any trauma to the chest area. Denies having any fevers or chills.    History reviewed. No pertinent past medical history. History reviewed. No pertinent past surgical history. No family history on file. Social History  Substance Use Topics  . Smoking status: Current Some Day Smoker -- 0.00 packs/day    Types: Cigars  . Smokeless tobacco: Never Used  . Alcohol Use: Yes     Comment: Once or twice a month   OB History    No data available     Review of Systems  Constitutional: Negative for fever and chills.  HENT: Negative for congestion and sore throat.        Chronic rhinorrhea   Eyes: Negative for pain and discharge.  Respiratory: Positive for chest tightness and shortness of breath. Negative for wheezing.        Chronic dry cough   Cardiovascular: Negative for chest pain, palpitations and leg swelling.  Gastrointestinal: Negative for nausea, vomiting and abdominal pain.  Genitourinary: Negative for dysuria and flank pain.  Musculoskeletal: Negative  for myalgias and arthralgias.  Skin: Negative for pallor and rash.  Neurological: Positive for light-headedness. Negative for weakness and numbness.      Allergies  Review of patient's allergies indicates no known allergies.  Home Medications   Prior to Admission medications   Medication Sig Start Date End Date Taking? Authorizing Provider  loratadine (CLARITIN) 10 MG tablet Take 10 mg by mouth daily as needed for allergies.   Yes Historical Provider, MD  amoxicillin-clavulanate (AUGMENTIN) 875-125 MG tablet Take 1 tablet by mouth every 12 (twelve) hours. Patient not taking: Reported on 08/26/2015 01/29/15   Antonietta Breach, PA-C  fluticasone [facility]) 50 MCG/ACT nasal spray Place 2 sprays into both nostrils daily. Patient not taking: Reported on 08/26/2015 01/29/15   Antonietta Breach, PA-C  HYDROcodone-acetaminophen (NORCO/VICODIN) 5-325 MG per tablet Take 1-2 tablets by mouth every 4 (four) hours as needed for pain. Patient not taking: Reported on 08/26/2015 12/10/12   Hazel Sams, PA-C  HYDROcodone-acetaminophen (NORCO/VICODIN) 5-325 MG per tablet Take 1-2 tablets by mouth every 6 (six) hours as needed. Patient not taking: Reported on 08/26/2015 12/22/13   Glendell Docker, NP  loratadine (CLARITIN) 10 MG tablet Take 1 tablet (10 mg total) by mouth daily. Patient not taking: Reported on 08/26/2015 01/29/15   Antonietta Breach, PA-C  permethrin (ELIMITE) 5 % cream Apply to affected area once Patient not taking: Reported on 08/26/2015 01/17/14   Carman Ching, PA-C  sodium chloride (OCEAN) 0.65 % SOLN nasal spray Place 1 spray into both nostrils as needed for congestion. Patient not taking: Reported on  08/26/2015 01/29/15   Antonietta Breach, PA-C  sulfamethoxazole-trimethoprim (SEPTRA DS) 800-160 MG per tablet Take 1 tablet by mouth 2 (two) times daily. Patient not taking: Reported on 08/26/2015 12/10/12   Hazel Sams, PA-C   BP 124/90 mmHg  Pulse 67  Temp(Src) 97.7 F (36.5 C) (Oral)  Resp 16  Ht 5\' 4"   (1.626 m)  Wt 94.348 kg  BMI 35.69 kg/m2  SpO2 100%  LMP 08/25/2015 Physical Exam  Constitutional: She is oriented to person, place, and time. She appears well-developed and well-nourished. No distress.  HENT:  Head: Normocephalic and atraumatic.  Mouth/Throat: Oropharynx is clear and moist.  Eyes: EOM are normal. Pupils are equal, round, and reactive to light.  Neck: Neck supple. No tracheal deviation present.  Cardiovascular: Normal rate, regular rhythm and intact distal pulses.  Exam reveals no gallop and no friction rub.   No murmur heard. Pulmonary/Chest: Effort normal and breath sounds normal. No respiratory distress. She has no wheezes. She has no rales.  Sternum mildly tender to palpation  Abdominal: Soft. Bowel sounds are normal. She exhibits no distension. There is no tenderness. There is no guarding.  Musculoskeletal: Normal range of motion. She exhibits no edema.  Neurological: She is alert and oriented to person, place, and time.  Skin: Skin is warm and dry. She is not diaphoretic.    ED Course  Procedures (including critical care time) Labs Review Labs Reviewed  BASIC METABOLIC PANEL - Abnormal; Notable for the following:    Glucose, Bld 101 (*)    All other components within normal limits  CBC  I-STAT TROPOININ, ED    Imaging Review Dg Chest 2 View  08/26/2015  CLINICAL DATA:  Shortness of breath and chest tightness EXAM: CHEST  2 VIEW COMPARISON:  02/29/2008 FINDINGS: The heart size and mediastinal contours are within normal limits. Both lungs are clear. The visualized skeletal structures are unremarkable. IMPRESSION: No active cardiopulmonary disease. Electronically Signed   By: Inez Catalina M.D.   On: 08/26/2015 19:43   I have personally reviewed and evaluated these images and lab results as part of my medical decision-making.   EKG Interpretation   Date/Time:  Monday August 26 2015 19:18:32 EDT Ventricular Rate:  66 PR Interval:  175 QRS Duration: 88 QT  Interval:  387 QTC Calculation: 405 R Axis:   52 Text Interpretation:  Sinus rhythm Low voltage, precordial leads Normal  ECG No previous tracing Confirmed by KNOTT MD, DANIEL [id number]) on 08/26/2015  9:37:57 PM      MDM   Final diagnoses:  None   Patient is presenting with a 4 day history of lighheadedness when going from sitting to standing position. Also experiencing chest tightness/ pain and SOB. Reports having substernal chest pain in the past a/w GERD symptoms.  Denies having any trauma to the chest area, however, sternum mildly tender to palpation on exam. Her chest pain is likely multifactorial - musculoskeletal and related to GERD. Not likely ACS as troponin is negative and EKG is not showing any acute ST/ T wave changes. Lighheadedness is likely orthostatic. BP and remaining vitals are stable at present. She is not in any respiratory distress (SpO2 100% on RA) and lungs clear on exam.  -Patient has been advised to change positions from siting/ squatting/ lying down to standing slowly. Advised to stay hydrated.  -Advised to take OTC Pepcid for occasional GERD symptoms -Advised to follow-up with her primary care physician within the next 3 days or return to the ED  if per chest pain/ dyspnea/ lightheadedness worsen.      Shela Leff, MD 08/26/15 2248  Leo Grosser, MD 08/27/15 0140

## 2015-08-26 NOTE — Discharge Instructions (Signed)
Please follow-up with your primary care physician within the next 3 days.  You may take over-the-counter Pepcid for acid reflux.     Gastroesophageal Reflux Disease, Adult Normally, food travels down the esophagus and stays in the stomach to be digested. If a person has gastroesophageal reflux disease (GERD), food and stomach acid move back up into the esophagus. When this happens, the esophagus becomes sore and swollen (inflamed). Over time, GERD can make small holes (ulcers) in the lining of the esophagus. HOME CARE Diet  Follow a diet as told by your doctor. You may need to avoid foods and drinks such as:  Coffee and tea (with or without caffeine).  Drinks that contain alcohol.  Energy drinks and sports drinks.  Carbonated drinks or sodas.  Chocolate and cocoa.  Peppermint and mint flavorings.  Garlic and onions.  Horseradish.  Spicy and acidic foods, such as peppers, chili powder, curry powder, vinegar, hot sauces, and BBQ sauce.  Citrus fruit juices and citrus fruits, such as oranges, lemons, and limes.  Tomato-based foods, such as red sauce, chili, salsa, and pizza with red sauce.  Fried and fatty foods, such as donuts, french fries, potato chips, and high-fat dressings.  High-fat meats, such as hot dogs, rib eye steak, sausage, ham, and bacon.  High-fat dairy items, such as whole milk, butter, and cream cheese.  Eat small meals often. Avoid eating large meals.  Avoid drinking large amounts of liquid with your meals.  Avoid eating meals during the 2-3 hours before bedtime.  Avoid lying down right after you eat.  Do not exercise right after you eat. General Instructions  Pay attention to any changes in your symptoms.  Take over-the-counter and prescription medicines only as told by your doctor. Do not take aspirin, ibuprofen, or other NSAIDs unless your doctor says it is okay.  Do not use any tobacco products, including cigarettes, chewing tobacco, and  e-cigarettes. If you need help quitting, ask your doctor.  Wear loose clothes. Do not wear anything tight around your waist.  Raise (elevate) the head of your bed about 6 inches (15 cm).  Try to lower your stress. If you need help doing this, ask your doctor.  If you are overweight, lose an amount of weight that is healthy for you. Ask your doctor about a safe weight loss goal.  Keep all follow-up visits as told by your doctor. This is important. GET HELP IF:  You have new symptoms.  You lose weight and you do not know why it is happening.  You have trouble swallowing, or it hurts to swallow.  You have wheezing or a cough that keeps happening.  Your symptoms do not get better with treatment.  You have a hoarse voice. GET HELP RIGHT AWAY IF:  You have pain in your arms, neck, jaw, teeth, or back.  You feel sweaty, dizzy, or light-headed.  You have chest pain or shortness of breath.  You throw up (vomit) and your throw up looks like blood or coffee grounds.  You pass out (faint).  Your poop (stool) is bloody or black.  You cannot swallow, drink, or eat.   This information is not intended to replace advice given to you by your health care provider. Make sure you discuss any questions you have with your health care provider.   Document Released: 08/19/2007 Document Revised: 11/21/2014 Document Reviewed: 06/27/2014 Elsevier Interactive Patient Education [employer].

## 2017-01-11 DIAGNOSIS — L299 Pruritus, unspecified: Secondary | ICD-10-CM | POA: Diagnosis not present

## 2017-01-25 ENCOUNTER — Other Ambulatory Visit: Payer: Self-pay

## 2017-01-25 ENCOUNTER — Emergency Department (HOSPITAL_COMMUNITY)
Admission: EM | Admit: 2017-01-25 | Discharge: 2017-01-25 | Disposition: A | Discharge status: Home / Self Care | Payer: 59 | Attending: Emergency Medicine | Admitting: Emergency Medicine

## 2017-01-25 ENCOUNTER — Encounter (HOSPITAL_COMMUNITY): Payer: Self-pay | Admitting: Emergency Medicine

## 2017-01-25 DIAGNOSIS — L299 Pruritus, unspecified: Secondary | ICD-10-CM | POA: Diagnosis not present

## 2017-01-25 DIAGNOSIS — F17290 Nicotine dependence, other tobacco product, uncomplicated: Secondary | ICD-10-CM | POA: Diagnosis not present

## 2017-01-25 DIAGNOSIS — E0590 Thyrotoxicosis, unspecified without thyrotoxic crisis or storm: Secondary | ICD-10-CM | POA: Diagnosis not present

## 2017-01-25 DIAGNOSIS — Z79899 Other long term (current) drug therapy: Secondary | ICD-10-CM | POA: Diagnosis not present

## 2017-01-25 LAB — COMPREHENSIVE METABOLIC PANEL
ALBUMIN: 3 g/dL — AB (ref 3.5–5.0)
ALT: 73 U/L — ABNORMAL HIGH (ref 14–54)
AST: 70 U/L — ABNORMAL HIGH (ref 15–41)
Alkaline Phosphatase: 107 U/L (ref 38–126)
Anion gap: 7 (ref 5–15)
BILIRUBIN TOTAL: 0.4 mg/dL (ref 0.3–1.2)
BUN: 10 mg/dL (ref 6–20)
CO2: 22 mmol/L (ref 22–32)
Calcium: 8.5 mg/dL — ABNORMAL LOW (ref 8.9–10.3)
Chloride: 106 mmol/L (ref 101–111)
Creatinine, Ser: 0.63 mg/dL (ref 0.44–1.00)
GFR calc Af Amer: >60 mL/min (ref >60)
GFR calc non Af Amer: >60 mL/min (ref >60)
GLUCOSE: 118 mg/dL — AB (ref 65–99)
POTASSIUM: 3.5 mmol/L (ref 3.5–5.1)
SODIUM: 135 mmol/L (ref 135–145)
TOTAL PROTEIN: 6 g/dL — AB (ref 6.5–8.1)

## 2017-01-25 LAB — T4, FREE: Free T4: 2.26 ng/dL — ABNORMAL HIGH (ref 0.61–1.12)

## 2017-01-25 LAB — TSH: TSH: <0.01 u[IU]/mL — ABNORMAL LOW (ref 0.350–4.500)

## 2017-01-25 MED ORDER — HYDROXYZINE HCL 25 MG PO TABS: 25.0000 mg | ORAL_TABLET | Freq: Four times a day (QID) | ORAL | 0 refills | Status: DC

## 2017-01-25 NOTE — ED Notes (Signed)
Pt understood dc material and follow up. NAD noted 

## 2017-01-25 NOTE — ED Provider Notes (Addendum)
[facility] EMERGENCY DEPARTMENT Provider Note   CSN: [id number] Arrival date & time: 01/25/17  8299     History   Chief Complaint Chief Complaint  Patient presents with  . Skin Itching    HPI [name] [name] is a 31 y.o. female.  Patient presents to the emergency department for evaluation of itching.  Patient reports that for the last couple of weeks she has had severe itching from head to toe.  She has not noticed any associated rash.  Patient denies any new skin products.  She has not had associated tongue swelling, throat swelling, trouble swallowing or shortness of breath.      History reviewed. No pertinent past medical history.  There are no active problems to display for this patient.   History reviewed. No pertinent surgical history.  OB History    No data available       Home Medications    Prior to Admission medications   Medication Sig Start Date End Date Taking? Authorizing Provider  amoxicillin-clavulanate (AUGMENTIN) 875-125 MG tablet Take 1 tablet by mouth every 12 (twelve) hours. Patient not taking: Reported on 08/26/2015 01/29/15   Antonietta Breach, PA-C  fluticasone [facility]) 50 MCG/ACT nasal spray Place 2 sprays into both nostrils daily. Patient not taking: Reported on 08/26/2015 01/29/15   Antonietta Breach, PA-C  HYDROcodone-acetaminophen (NORCO/VICODIN) 5-325 MG per tablet Take 1-2 tablets by mouth every 4 (four) hours as needed for pain. Patient not taking: Reported on 08/26/2015 12/10/12   Hazel Sams, PA-C  HYDROcodone-acetaminophen (NORCO/VICODIN) 5-325 MG per tablet Take 1-2 tablets by mouth every 6 (six) hours as needed. Patient not taking: Reported on 08/26/2015 12/22/13   Glendell Docker, NP  hydrOXYzine (ATARAX/VISTARIL) 25 MG tablet Take 1 tablet (25 mg total) every 6 (six) hours by mouth. 01/25/17   Baird Polinski, Gwenyth Allegra, MD  loratadine (CLARITIN) 10 MG tablet Take 1 tablet (10 mg total) by mouth daily. Patient not  taking: Reported on 08/26/2015 01/29/15   Antonietta Breach, PA-C  loratadine (CLARITIN) 10 MG tablet Take 10 mg by mouth daily as needed for allergies.    [provider]  permethrin (ELIMITE) 5 % cream Apply to affected area once Patient not taking: Reported on 08/26/2015 01/17/14   Hess, Hessie Diener, PA-C  sodium chloride (OCEAN) 0.65 % SOLN nasal spray Place 1 spray into both nostrils as needed for congestion. Patient not taking: Reported on 08/26/2015 01/29/15   Antonietta Breach, PA-C  sulfamethoxazole-trimethoprim [facility]) 800-160 MG per tablet Take 1 tablet by mouth 2 (two) times daily. Patient not taking: Reported on 08/26/2015 12/10/12   Hazel Sams, PA-C    Family History No family history on file.  Social History Social History   Tobacco Use  . Smoking status: Current Some Day Smoker    Packs/day: 0.00    Types: Cigars  . Smokeless tobacco: Never Used  Substance Use Topics  . Alcohol use: Yes    Comment: Once or twice a month  . Drug use: No     Allergies   Patient has no known allergies.   Review of Systems Review of Systems  Skin: Negative for rash.       itching  All other systems reviewed and are negative.    Physical Exam Updated Vital Signs BP 106/61   Pulse 84   Temp 98.1 F (36.7 C) (Oral)   Resp 16   Ht 5\' 5"  (1.651 m)   Wt 102.5 kg (226 lb)   SpO2  99%   BMI 37.61 kg/m   Physical Exam  Constitutional: She is oriented to person, place, and time. She appears well-developed and well-nourished. No distress.  HENT:  Head: Normocephalic and atraumatic.  Right Ear: Hearing normal.  Left Ear: Hearing normal.  Nose: Nose normal.  Mouth/Throat: Oropharynx is clear and moist and mucous membranes are normal.  Eyes: Conjunctivae and EOM are normal. Pupils are equal, round, and reactive to light.  Neck: Normal range of motion. Neck supple.  Cardiovascular: Regular rhythm, S1 normal and S2 normal. Exam reveals no gallop and no friction rub.  No murmur  heard. Pulmonary/Chest: Effort normal and breath sounds normal. No respiratory distress. She exhibits no tenderness.  Abdominal: Soft. Normal appearance and bowel sounds are normal. There is no hepatosplenomegaly. There is no tenderness. There is no rebound, no guarding, no tenderness at McBurney's point and negative Murphy's sign. No hernia.  Musculoskeletal: Normal range of motion.  Neurological: She is alert and oriented to person, place, and time. She has normal strength. No cranial nerve deficit or sensory deficit. Coordination normal. GCS eye subscore is 4. GCS verbal subscore is 5. GCS motor subscore is 6.  Skin: Skin is warm, dry and intact. No rash noted. No cyanosis.  Psychiatric: She has a normal mood and affect. Her speech is normal and behavior is normal. Thought content normal.  Nursing note and vitals reviewed.    ED Treatments / Results  Labs (all labs ordered are listed, but only abnormal results are displayed) Labs Reviewed  COMPREHENSIVE METABOLIC PANEL - Abnormal; Notable for the following components:      Result Value   Glucose, Bld 118 (*)    Calcium 8.5 (*)    Total Protein 6.0 (*)    Albumin 3.0 (*)    AST 70 (*)    ALT 73 (*)    All other components within normal limits  TSH - Abnormal; Notable for the following components:   TSH <0.010 (*)    All other components within normal limits  T4, FREE - Abnormal; Notable for the following components:   Free T4 2.26 (*)    All other components within normal limits  T3, FREE    EKG  EKG Interpretation None       Radiology No results found.  Procedures Procedures (including critical care time)  Medications Ordered in ED Medications - No data to display   Initial Impression / Assessment and Plan / ED Course  I have reviewed the triage vital signs and the nursing notes.  Pertinent labs & imaging results that were available during my care of the patient were reviewed by me and considered in my medical  decision making (see chart for details).     Patient presents to the emergency department for evaluation of pruritus.  Patient has had waxing and waning, at times severe, pruritus for several weeks.  There is no associated skin rash.  Her examination is entirely normal today.  Patient very concerned about the cause, as she has been gurgling her symptoms.  Lab work does not reveal any evidence of hyperbilirubinemia, uremia, TSH pending.  Patient reassured, treat with skin moisturizer and hydroxyzine as needed.  TSH <0.010, elevated free T4, free T3 pending.  Consistent with hyperthyroidism.  Results shared with patient, will need to follow-up with endocrinology for further workup.  Final Clinical Impressions(s) / ED Diagnoses   Final diagnoses:  Pruritus  Hyperthyroidism    ED Discharge Orders  Ordered    hydrOXYzine (ATARAX/VISTARIL) 25 MG tablet  Every 6 hours     01/25/17 0619       Orpah Greek, MD 01/25/17 5189    Orpah Greek, MD 01/25/17 [phone]

## 2017-01-25 NOTE — ED Notes (Signed)
Called lab and added on T3 and T4

## 2017-01-25 NOTE — ED Triage Notes (Signed)
Patient reports generalized itchy skin for several weeks , no rashes , denies oral swelling/respirations unlabored .

## 2017-01-26 LAB — T3, FREE: T3, Free: 9.3 pg/mL — ABNORMAL HIGH (ref 2.0–4.4)

## 2017-01-28 ENCOUNTER — Telehealth: Payer: Self-pay | Admitting: *Deleted

## 2017-01-28 NOTE — Telephone Encounter (Signed)
Pt called upset regarding Endocrine practice she was referred to.  Pt states office DID NOT receive referral from ED and only received labs that were within normal limits.   EDCM called office to find that they had indeed received referral and labs and were awaiting MD review before returning call to pt with next steps; and that this process was relayed to pt.    EDCM returned call to pt to explain office procedure.  Pt felt like process was long and wanted advice as to how to speed things up.  EDCM suggested she visit her PCP and ask for another referral if she could not wait for this one.  Pt states she does not have PCP- EDCM advised to call number on insurance card for a list of PCP accepting new pts in her area.  Pt appreciative of advice.  [name] [name], [place], [place], [place]

## 2017-01-29 ENCOUNTER — Encounter: Payer: Self-pay | Admitting: Endocrinology

## 2017-01-29 ENCOUNTER — Ambulatory Visit (INDEPENDENT_AMBULATORY_CARE_PROVIDER_SITE_OTHER): Payer: 59 | Admitting: Endocrinology

## 2017-01-29 DIAGNOSIS — E0590 Thyrotoxicosis, unspecified without thyrotoxic crisis or storm: Secondary | ICD-10-CM | POA: Diagnosis not present

## 2017-01-29 MED ORDER — METHIMAZOLE 10 MG PO TABS: 20.0000 mg | ORAL_TABLET | Freq: Two times a day (BID) | ORAL | 2 refills | Status: DC

## 2017-01-29 MED ORDER — TRIAMCINOLONE ACETONIDE 0.1 % EX CREA: 1.0000 "application " | TOPICAL_CREAM | Freq: Four times a day (QID) | CUTANEOUS | 2 refills | Status: DC

## 2017-01-29 NOTE — Patient Instructions (Addendum)
I have sent 2 prescription to your pharmacy: for the itching, and to slow the thyroid. If ever you have fever while taking methimazole, stop it and call us, even if the reason is obvious, because of the risk of a rare side-effect. Please come back for a follow-up appointment in 2-3 weeks. Let's check the ultrasound.  you will receive a phone call, about a day and time for an appointment.  That way we can compare this to any future ultrasounds.  You can change your mind and take the radioactive iodine pill later if you want.  It works like this:  We would first check a thyroid "scan" (a special, but easy and painless type of thyroid x ray).  you go to the x-ray department of the hospital to swallow a pill, which contains a miniscule amount of radiation.  You will not notice any symptoms from this.  You will go back to the x-ray department the next day, to lie down in front of a camera.  The results of this will be sent to me.   Based on the results, i hope to order for you a treatment pill of radioactive iodine.  Although it is a larger amount of radiation, you will again notice no symptoms from this.  The pill is gone from your body in a few days (during which you should stay away from other people), but takes several months to work.  Therefore, please return here approximately 6-8 weeks after the treatment.  This treatment has been available for many years, and the only known side-effect is an underactive thyroid.  It is possible that i would eventually prescribe for you a thyroid hormone pill, which is very inexpensive.  You don't have to worry about side-effects of this thyroid hormone pill, because it is the same molecule your thyroid makes.       Hyperthyroidism Hyperthyroidism is when the thyroid is too active (overactive). Your thyroid is a large gland that is located in your neck. The thyroid helps to control how your body uses food (metabolism). When your thyroid is overactive, it produces too  much of a hormone called thyroxine. What are the causes? Causes of hyperthyroidism may include:  Graves disease. This is when your immune system attacks the thyroid gland. This is the most common cause.  Inflammation of the thyroid gland.  Tumor in the thyroid gland or somewhere else.  Excessive use of thyroid medicines, including: ? Prescription thyroid supplement. ? Herbal supplements that mimic thyroid hormones.  Solid or fluid-filled lumps within your thyroid gland (thyroid nodules).  Excessive ingestion of iodine.  What increases the risk?  Being female.  Having a family history of thyroid conditions. What are the signs or symptoms? Signs and symptoms of hyperthyroidism may include:  Nervousness.  Inability to tolerate heat.  Unexplained weight loss.  Diarrhea.  Change in the texture of hair or skin.  Heart skipping beats or making extra beats.  Rapid heart rate.  Loss of menstruation.  Shaky hands.  Fatigue.  Restlessness.  Increased appetite.  Sleep problems.  Enlarged thyroid gland or nodules.  How is this diagnosed? Diagnosis of hyperthyroidism may include:  Medical history and physical exam.  Blood tests.  Ultrasound tests.  How is this treated? Treatment may include:  Medicines to control your thyroid.  Surgery to remove your thyroid.  Radiation therapy.  Follow these instructions at home:  Take medicines only as directed by your health care provider.  Do not use any tobacco  products, including cigarettes, chewing tobacco, or electronic cigarettes. If you need help quitting, ask your health care provider.  Do not exercise or do physical activity until your health care provider approves.  Keep all follow-up appointments as directed by your health care provider. This is important. Contact a health care provider if:  Your symptoms do not get better with treatment.  You have fever.  You are taking thyroid replacement  medicine and you: ? Have depression. ? Feel mentally and physically slow. ? Have weight gain. Get help right away if:  You have decreased alertness or a change in your awareness.  You have abdominal pain.  You feel dizzy.  You have a rapid heartbeat.  You have an irregular heartbeat. This information is not intended to replace advice given to you by your health care provider. Make sure you discuss any questions you have with your health care provider. Document Released: 03/02/2005 Document Revised: 08/01/2015 Document Reviewed: 07/18/2013 Elsevier Interactive Patient Education  2017 [employer].

## 2017-01-29 NOTE — Progress Notes (Signed)
Subjective:    Patient ID: [name] [name], female    DOB: 05/22/1985, 31 y.o.   MRN: [id number]  HPI Pt is referred by Dr Betsey Holiday, for hyperthyroidism.  Pt reports she was dx'ed with hyperthyroidism in 2018.  she has never been on therapy for this.  she has never had XRT to the anterior neck, or thyroid surgery.  she has never had thyroid imaging.  she does not consume kelp or any other non-prescribed thyroid medication.  she has never been on amiodarone.  She has moderate itching throughout the body, and assoc rhinorrhea. History reviewed. No pertinent past medical history.  History reviewed. No pertinent surgical history.  Social History   Socioeconomic History  . Marital status: Single    Spouse name: Not on file  . Number of children: Not on file  . Years of education: Not on file  . Highest education level: Not on file  Social Needs  . Financial resource strain: Not on file  . Food insecurity - worry: Not on file  . Food insecurity - inability: Not on file  . Transportation needs - medical: Not on file  . Transportation needs - non-medical: Not on file  Occupational History  . Not on file  Tobacco Use  . Smoking status: Current Some Day Smoker    Packs/day: 0.00    Types: Cigars  . Smokeless tobacco: Never Used  Substance and Sexual Activity  . Alcohol use: Yes    Comment: Once or twice a month  . Drug use: No  . Sexual activity: Yes    Birth control/protection: IUD  Other Topics Concern  . Not on file  Social History Narrative  . Not on file    Current Outpatient Medications on File Prior to Visit  Medication Sig Dispense Refill  . fluticasone (FLONASE) 50 MCG/ACT nasal spray Place 2 sprays into both nostrils daily. 16 g 0  . hydrOXYzine (ATARAX/VISTARIL) 25 MG tablet Take 1 tablet (25 mg total) every 6 (six) hours by mouth. 30 tablet 0  . loratadine (CLARITIN) 10 MG tablet Take 10 mg by mouth daily as needed for allergies.     No current  facility-administered medications on file prior to visit.     No Known Allergies  Family History  Problem Relation Age of Onset  . Thyroid disease Neg Hx     BP 118/60 (BP Location: Left Arm, Patient Position: Sitting, Cuff Size: Normal)   Pulse (!) 103   Wt 223 lb 9.6 oz (101.4 kg)   SpO2 98%   BMI 37.21 kg/m     Review of Systems denies weight loss, headache, hoarseness, diplopia, palpitations, diarrhea, polyuria, muscle weakness, edema, excessive diaphoresis, tremor, anxiety, and easy bruising.  She has nasal congestion, heat intolerance, and doe.      Objective:   Physical Exam VS: see vs page GEN: no distress HEAD: head: no deformity eyes: no periorbital swelling, no proptosis external nose and ears are normal mouth: no lesion seen NECK: small multinodular goiter.   CHEST WALL: no deformity LUNGS: clear to auscultation CV: reg rate and rhythm, no murmur ABD: abdomen is soft, nontender.  no hepatosplenomegaly.  not distended.  no hernia MUSCULOSKELETAL: muscle bulk and strength are grossly normal.  no obvious joint swelling.  gait is normal and steady EXTEMITIES: no deformity.  no edema PULSES: no carotid bruit NEURO:  cn 2-12 grossly intact.   readily moves all 4's.  sensation is intact to touch on all 4's.  No tremor SKIN:  Normal texture and temperature.  No rash or suspicious lesion is visible.  Not diaphoretic NODES:  None palpable at the neck PSYCH: alert, well-oriented.  Does not appear anxious nor depressed.   I have reviewed outside records, and summarized: Pt was noted to have hyperthyroidism, and referred here.  ER: pt was seen for itching, and TFT were checked.    Lab Results  Component Value Date   TSH <0.010 (L) 01/25/2017       Assessment & Plan:  Hyperthyroidism, new, uncertain etiology.  She has factors favoring nodular goiter and Grave's Dz. She may have both.  We discussed rx options.  She chooses tapazole, at least for now.     Patient  Instructions  I have sent 2 prescription to your pharmacy: for the itching, and to slow the thyroid. If ever you have fever while taking methimazole, stop it and call us, even if the reason is obvious, because of the risk of a rare side-effect. Please come back for a follow-up appointment in 2-3 weeks. Let's check the ultrasound.  you will receive a phone call, about a day and time for an appointment.  That way we can compare this to any future ultrasounds.  You can change your mind and take the radioactive iodine pill later if you want.  It works like this:  We would first check a thyroid "scan" (a special, but easy and painless type of thyroid x ray).  you go to the x-ray department of the hospital to swallow a pill, which contains a miniscule amount of radiation.  You will not notice any symptoms from this.  You will go back to the x-ray department the next day, to lie down in front of a camera.  The results of this will be sent to me.   Based on the results, i hope to order for you a treatment pill of radioactive iodine.  Although it is a larger amount of radiation, you will again notice no symptoms from this.  The pill is gone from your body in a few days (during which you should stay away from other people), but takes several months to work.  Therefore, please return here approximately 6-8 weeks after the treatment.  This treatment has been available for many years, and the only known side-effect is an underactive thyroid.  It is possible that i would eventually prescribe for you a thyroid hormone pill, which is very inexpensive.  You don't have to worry about side-effects of this thyroid hormone pill, because it is the same molecule your thyroid makes.       Hyperthyroidism Hyperthyroidism is when the thyroid is too active (overactive). Your thyroid is a large gland that is located in your neck. The thyroid helps to control how your body uses food (metabolism). When your thyroid is overactive,  it produces too much of a hormone called thyroxine. What are the causes? Causes of hyperthyroidism may include:  Graves disease. This is when your immune system attacks the thyroid gland. This is the most common cause.  Inflammation of the thyroid gland.  Tumor in the thyroid gland or somewhere else.  Excessive use of thyroid medicines, including: ? Prescription thyroid supplement. ? Herbal supplements that mimic thyroid hormones.  Solid or fluid-filled lumps within your thyroid gland (thyroid nodules).  Excessive ingestion of iodine.  What increases the risk?  Being female.  Having a family history of thyroid conditions. What are the signs or symptoms? Signs and symptoms  of hyperthyroidism may include:  Nervousness.  Inability to tolerate heat.  Unexplained weight loss.  Diarrhea.  Change in the texture of hair or skin.  Heart skipping beats or making extra beats.  Rapid heart rate.  Loss of menstruation.  Shaky hands.  Fatigue.  Restlessness.  Increased appetite.  Sleep problems.  Enlarged thyroid gland or nodules.  How is this diagnosed? Diagnosis of hyperthyroidism may include:  Medical history and physical exam.  Blood tests.  Ultrasound tests.  How is this treated? Treatment may include:  Medicines to control your thyroid.  Surgery to remove your thyroid.  Radiation therapy.  Follow these instructions at home:  Take medicines only as directed by your health care provider.  Do not use any tobacco products, including cigarettes, chewing tobacco, or electronic cigarettes. If you need help quitting, ask your health care provider.  Do not exercise or do physical activity until your health care provider approves.  Keep all follow-up appointments as directed by your health care provider. This is important. Contact a health care provider if:  Your symptoms do not get better with treatment.  You have fever.  You are taking thyroid  replacement medicine and you: ? Have depression. ? Feel mentally and physically slow. ? Have weight gain. Get help right away if:  You have decreased alertness or a change in your awareness.  You have abdominal pain.  You feel dizzy.  You have a rapid heartbeat.  You have an irregular heartbeat. This information is not intended to replace advice given to you by your health care provider. Make sure you discuss any questions you have with your health care provider. Document Released: 03/02/2005 Document Revised: 08/01/2015 Document Reviewed: 07/18/2013 Elsevier Interactive Patient Education  2017 [employer].

## 2017-02-03 ENCOUNTER — Other Ambulatory Visit: Payer: 59

## 2017-02-09 ENCOUNTER — Ambulatory Visit
Admission: RE | Admit: 2017-02-09 | Discharge: 2017-02-09 | Disposition: A | Discharge status: Home / Self Care | Payer: 59 | Source: Ambulatory Visit | Attending: Endocrinology | Admitting: Endocrinology

## 2017-02-09 DIAGNOSIS — E041 Nontoxic single thyroid nodule: Secondary | ICD-10-CM | POA: Diagnosis not present

## 2017-02-09 DIAGNOSIS — E0590 Thyrotoxicosis, unspecified without thyrotoxic crisis or storm: Secondary | ICD-10-CM

## 2017-02-19 ENCOUNTER — Encounter: Payer: Self-pay | Admitting: Endocrinology

## 2017-02-19 ENCOUNTER — Ambulatory Visit: Payer: 59 | Admitting: Endocrinology

## 2017-02-19 VITALS — BP 120/72 | HR 81 | Wt 224.4 lb

## 2017-02-19 DIAGNOSIS — J309 Allergic rhinitis, unspecified: Secondary | ICD-10-CM | POA: Diagnosis not present

## 2017-02-19 DIAGNOSIS — E0590 Thyrotoxicosis, unspecified without thyrotoxic crisis or storm: Secondary | ICD-10-CM | POA: Diagnosis not present

## 2017-02-19 LAB — T4, FREE: Free T4: 1.11 ng/dL (ref 0.60–1.60)

## 2017-02-19 LAB — TSH

## 2017-02-19 MED ORDER — METHIMAZOLE 10 MG PO TABS: 10.0000 mg | ORAL_TABLET | Freq: Two times a day (BID) | ORAL | 2 refills | Status: DC

## 2017-02-19 NOTE — Progress Notes (Signed)
   Subjective:    Patient ID: [name] [name], female    DOB: 03-03-1986, 31 y.o.   MRN: [id number]  HPI Pt returns for f/u of hyperthyroidism (dx'ed 2018; she chose tapazole rx; [name] was c/w thyroiditis).  Hyperthyroidism, new, uncertain etiology.  She has factors favoring nodular goiter and Grave's Dz. She may have both.  We discussed rx options.  She chooses tapazole, at least for now.  Past Medical History:  Diagnosis Date  . Allergic rhinitis     History reviewed. No pertinent surgical history.  Social History   Socioeconomic History  . Marital status: Married    Spouse name: Not on file  . Number of children: Not on file  . Years of education: Not on file  . Highest education level: Not on file  Social Needs  . Financial resource strain: Not on file  . Food insecurity - worry: Not on file  . Food insecurity - inability: Not on file  . Transportation needs - medical: Not on file  . Transportation needs - non-medical: Not on file  Occupational History  . Not on file  Tobacco Use  . Smoking status: Current Some Day Smoker    Packs/day: 0.00    Types: Cigars  . Smokeless tobacco: Never Used  Substance and Sexual Activity  . Alcohol use: Yes    Comment: Once or twice a month  . Drug use: No  . Sexual activity: Yes    Birth control/protection: IUD  Other Topics Concern  . Not on file  Social History Narrative  . Not on file    Current Outpatient Medications on File Prior to Visit  Medication Sig Dispense Refill  . fluticasone (FLONASE) 50 MCG/ACT nasal spray Place 2 sprays into both nostrils daily. 16 g 0  . hydrOXYzine (ATARAX/VISTARIL) 25 MG tablet Take 1 tablet (25 mg total) every 6 (six) hours by mouth. 30 tablet 0  . loratadine (CLARITIN) 10 MG tablet Take 10 mg by mouth daily as needed for allergies.    Marland Kitchen triamcinolone cream (KENALOG) 0.1 % Apply 1 application 4 (four) times daily topically. As needed for itching 80 g 2   No current facility-administered  medications on file prior to visit.     No Known Allergies  Family History  Problem Relation Age of Onset  . Thyroid disease Neg Hx     BP 120/72 (BP Location: Left Arm, Patient Position: Sitting, Cuff Size: Normal)   Pulse 81   Wt 224 lb 6.4 oz (101.8 kg)   SpO2 98%   BMI 37.34 kg/m    Review of Systems Denies fever    Objective:   Physical Exam VITAL SIGNS:  See vs page GENERAL: no distress NECK: Thyroid is slightly enlarged. Correlated with [name], still seems to have multinodular surface      Assessment & Plan:  Hyperthyroidism, due to [place] today.   pseudonodular thyroid, due to thyroiditis.   Patient Instructions  blood tests are requested for you today.  We'll let you know about the results. If ever you have fever while taking methimazole, stop it and call us, even if the reason is obvious, because of the risk of a rare side-effect.  Please come back for a follow-up appointment in 6 weeks.

## 2017-02-19 NOTE — Patient Instructions (Signed)
blood tests are requested for you today.  We'll let you know about the results. If ever you have fever while taking methimazole, stop it and call us, even if the reason is obvious, because of the risk of a rare side-effect. Please come back for a follow-up appointment in 6 weeks.  

## 2017-02-20 DIAGNOSIS — Z0100 Encounter for examination of eyes and vision without abnormal findings: Secondary | ICD-10-CM | POA: Diagnosis not present

## 2017-03-29 ENCOUNTER — Ambulatory Visit: Payer: 59 | Admitting: Endocrinology

## 2017-03-29 ENCOUNTER — Encounter: Payer: Self-pay | Admitting: Endocrinology

## 2017-03-29 VITALS — BP 122/80 | HR 66 | Wt 231.0 lb

## 2017-03-29 DIAGNOSIS — E0590 Thyrotoxicosis, unspecified without thyrotoxic crisis or storm: Secondary | ICD-10-CM

## 2017-03-29 DIAGNOSIS — R0600 Dyspnea, unspecified: Secondary | ICD-10-CM | POA: Diagnosis not present

## 2017-03-29 NOTE — Patient Instructions (Addendum)
blood tests are requested for you today.  We'll let you know about the results. If ever you have fever while taking methimazole, stop it and call us, even if the reason is obvious, because of the risk of a rare side-effect. If you want, you can take the radioactive iodine pill.  It works like this: let's check a thyroid "scan" (a special, but easy and painless type of thyroid x ray).  you go to the x-ray department of the hospital to swallow a pill, which contains a miniscule amount of radiation.  You will not notice any symptoms from this.  You will go back to the x-ray department the next day, to lie down in front of a camera.  The results of this will be sent to me.   Based on the results, i hope to order for you a treatment pill of radioactive iodine.  Although it is a larger amount of radiation, you will again notice no symptoms from this.  The pill is gone from your body in a few days (during which you should stay away from other people), but takes several months to work.  Therefore, please return here approximately 6-8 weeks after the treatment.  This treatment has been available for many years, and the only known side-effect is an underactive thyroid.  It is possible that i would eventually prescribe for you a thyroid hormone pill, which is very inexpensive.  You don't have to worry about side-effects of this thyroid hormone pill, because it is the same molecule your thyroid makes.   Please see a new PCP, for the asthma.   Please come back for a follow-up appointment in 2 months.

## 2017-03-29 NOTE — Progress Notes (Signed)
Subjective:    Patient ID: [name] [name], female    DOB: 19-Aug-1985, 32 y.o.   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  Pt reports slight sob, and assoc wheezing.   Past Medical History:  Diagnosis Date  . Allergic rhinitis     History reviewed. No pertinent surgical history.  Social History   Socioeconomic History  . Marital status: Married    Spouse name: Not on file  . Number of children: Not on file  . Years of education: Not on file  . Highest education level: Not on file  Social Needs  . Financial resource strain: Not on file  . Food insecurity - worry: Not on file  . Food insecurity - inability: Not on file  . Transportation needs - medical: Not on file  . Transportation needs - non-medical: Not on file  Occupational History  . Not on file  Tobacco Use  . Smoking status: Current Some Day Smoker    Packs/day: 0.00    Types: Cigars  . Smokeless tobacco: Never Used  Substance and Sexual Activity  . Alcohol use: Yes    Comment: Once or twice a month  . Drug use: No  . Sexual activity: Yes    Birth control/protection: IUD  Other Topics Concern  . Not on file  Social History Narrative  . Not on file    Current Outpatient Medications on File Prior to Visit  Medication Sig Dispense Refill  . fluticasone (FLONASE) 50 MCG/ACT nasal spray Place 2 sprays into both nostrils daily. 16 g 0  . hydrOXYzine (ATARAX/VISTARIL) 25 MG tablet Take 1 tablet (25 mg total) every 6 (six) hours by mouth. 30 tablet 0  . loratadine (CLARITIN) 10 MG tablet Take 10 mg by mouth daily as needed for allergies.    . methimazole (TAPAZOLE) 10 MG tablet Take 1 tablet (10 mg total) by mouth 2 (two) times daily. 60 tablet 2  . triamcinolone cream (KENALOG) 0.1 % Apply 1 application 4 (four) times daily topically. As needed for  itching 80 g 2   No current facility-administered medications on file prior to visit.     No Known Allergies  Family History  Problem Relation Age of Onset  . Thyroid disease Neg Hx     BP 122/80 (BP Location: Left Arm, Patient Position: Sitting, Cuff Size: Normal)   Pulse 66   Wt 231 lb (104.8 kg)   SpO2 97%   BMI 38.44 kg/m    Review of Systems Denies fever, but she has slight cough.    Objective:   Physical Exam VITAL SIGNS:  See vs page GENERAL: no distress NECK: Thyroid is slightly enlarged, with multinodular surface  I personally reviewed spirometry tracing (today): Indication: dyspnea Impression: moderate obstruction, but no extrapulmonary component Comparison is available     Assessment & Plan:  Sob, new, due to asthma.  Hyperthyroidism: due for recheck.   Patient Instructions  blood tests are requested for you today.  We'll let you know about the results. If ever you have fever while taking methimazole, stop it and call us, even if the reason is obvious, because of the risk of a rare side-effect. If you want, you can take the radioactive iodine pill.  It works like this: let's check a thyroid "scan" (a special, but easy and painless  type of thyroid x ray).  you go to the x-ray department of the hospital to swallow a pill, which contains a miniscule amount of radiation.  You will not notice any symptoms from this.  You will go back to the x-ray department the next day, to lie down in front of a camera.  The results of this will be sent to me.   Based on the results, i hope to order for you a treatment pill of radioactive iodine.  Although it is a larger amount of radiation, you will again notice no symptoms from this.  The pill is gone from your body in a few days (during which you should stay away from other people), but takes several months to work.  Therefore, please return here approximately 6-8 weeks after the treatment.  This treatment has been available for  many years, and the only known side-effect is an underactive thyroid.  It is possible that i would eventually prescribe for you a thyroid hormone pill, which is very inexpensive.  You don't have to worry about side-effects of this thyroid hormone pill, because it is the same molecule your thyroid makes.   Please see a new PCP, for the asthma.   Please come back for a follow-up appointment in 2 months.

## 2017-03-30 ENCOUNTER — Other Ambulatory Visit: Payer: Self-pay | Admitting: Endocrinology

## 2017-03-30 LAB — TSH: TSH: 4.85 u[IU]/mL — ABNORMAL HIGH (ref 0.35–4.50)

## 2017-03-30 LAB — T4, FREE: Free T4: 0.61 ng/dL (ref 0.60–1.60)

## 2017-03-30 MED ORDER — METHIMAZOLE 10 MG PO TABS: 10.0000 mg | ORAL_TABLET | Freq: Every day | ORAL | 2 refills | Status: DC

## 2017-04-08 ENCOUNTER — Other Ambulatory Visit: Payer: Self-pay | Admitting: Endocrinology

## 2017-04-12 ENCOUNTER — Emergency Department (HOSPITAL_COMMUNITY): Payer: 59

## 2017-04-12 ENCOUNTER — Encounter (HOSPITAL_COMMUNITY): Payer: Self-pay

## 2017-04-12 ENCOUNTER — Emergency Department (HOSPITAL_COMMUNITY)
Admission: EM | Admit: 2017-04-12 | Discharge: 2017-04-12 | Disposition: A | Discharge status: Home / Self Care | Payer: 59 | Attending: Emergency Medicine | Admitting: Emergency Medicine

## 2017-04-12 ENCOUNTER — Other Ambulatory Visit: Payer: Self-pay

## 2017-04-12 DIAGNOSIS — F17290 Nicotine dependence, other tobacco product, uncomplicated: Secondary | ICD-10-CM | POA: Diagnosis not present

## 2017-04-12 DIAGNOSIS — E039 Hypothyroidism, unspecified: Secondary | ICD-10-CM | POA: Insufficient documentation

## 2017-04-12 DIAGNOSIS — R05 Cough: Secondary | ICD-10-CM | POA: Diagnosis not present

## 2017-04-12 DIAGNOSIS — J069 Acute upper respiratory infection, unspecified: Secondary | ICD-10-CM | POA: Insufficient documentation

## 2017-04-12 DIAGNOSIS — J45909 Unspecified asthma, uncomplicated: Secondary | ICD-10-CM | POA: Insufficient documentation

## 2017-04-12 DIAGNOSIS — R0789 Other chest pain: Secondary | ICD-10-CM | POA: Diagnosis not present

## 2017-04-12 DIAGNOSIS — R0602 Shortness of breath: Secondary | ICD-10-CM | POA: Diagnosis present

## 2017-04-12 LAB — POC URINE PREG, ED: Preg Test, Ur: NEGATIVE

## 2017-04-12 MED ORDER — ALBUTEROL SULFATE (2.5 MG/3ML) 0.083% IN NEBU
5.0000 mg | INHALATION_SOLUTION | Freq: Once | RESPIRATORY_TRACT | Status: AC
  Administered 2017-04-12: 5 mg via RESPIRATORY_TRACT
  Filled 2017-04-12: qty 6

## 2017-04-12 MED ORDER — FLUTICASONE PROPIONATE 50 MCG/ACT NA SUSP: 1.0000 | Freq: Every day | NASAL | 2 refills | Status: DC

## 2017-04-12 MED ORDER — AEROCHAMBER PLUS FLO-VU MEDIUM MISC
1.0000 | Freq: Once | Status: AC
  Administered 2017-04-12: 1
  Filled 2017-04-12: qty 1

## 2017-04-12 MED ORDER — BENZONATATE 100 MG PO CAPS: 100.0000 mg | ORAL_CAPSULE | Freq: Three times a day (TID) | ORAL | 0 refills | Status: DC

## 2017-04-12 MED ORDER — ALBUTEROL SULFATE HFA 108 (90 BASE) MCG/ACT IN AERS
2.0000 | INHALATION_SPRAY | RESPIRATORY_TRACT | Status: DC | PRN
  Administered 2017-04-12: 2 via RESPIRATORY_TRACT
  Filled 2017-04-12: qty 6.7

## 2017-04-12 MED ORDER — ACETAMINOPHEN 325 MG PO TABS
650.0000 mg | ORAL_TABLET | Freq: Once | ORAL | Status: AC
  Administered 2017-04-12: 650 mg via ORAL
  Filled 2017-04-12: qty 2

## 2017-04-12 NOTE — ED Notes (Signed)
Bed: WTR8 Expected date:  Expected time:  Means of arrival:  Comments: 

## 2017-04-12 NOTE — ED Provider Notes (Signed)
[facility] DEPT Provider Note   CSN: [id number] Arrival date & time: 04/12/17  1113     History   Chief Complaint Chief Complaint  Patient presents with  . Shortness of Breath    HPI [name] [name] is a 32 y.o. female with past medical history significant for hypothyroidism and reports a recent diagnosis of asthma presenting with 3 days of congestion, cough, wheezing and feeling her chest is tight and short of breath.  States that she does not have an inhaler at home.  She reports that her endocrinologist did what sounds like a spirometry test and told her that she had asthma needed to follow-up with her primary care provider.  Patient is reporting that she does not have a primary care provider and has made an appointment with tried internal medicine on February 21.  She had been starting to notice wheezing about a month ago intermittently worse with cold temperature and nighttime and feels like it has been worsening especially now with the cold symptoms.  Patient denies any history of childhood asthma but has a brother with asthma.  She has tried DayQuil and TheraFlu without relief.    She also reports nausea over the last 2 days, no vomiting, diarrhea, abdominal pain.  States that she has had a Mirena IUD for the last 7 years.  She explains that her OB/GYN told her that they were doing a study to extend the duration of the IUD to 10 years.  She does report a history of unprotected intercourse.  HPI  Past Medical History:  Diagnosis Date  . Allergic rhinitis     Patient Active Problem List   Diagnosis Date Noted  . Dyspnea 03/29/2017  . Allergic rhinitis   . Hyperthyroidism 01/29/2017    History reviewed. No pertinent surgical history.  OB History    No data available       Home Medications    Prior to Admission medications   Medication Sig Start Date End Date Taking? Authorizing Provider  methimazole (TAPAZOLE) 10 MG tablet Take 1  tablet (10 mg total) by mouth daily. 03/30/17  Yes Renato Shin, MD  benzonatate (TESSALON) 100 MG capsule Take 1 capsule (100 mg total) by mouth every 8 (eight) hours. 04/12/17   Avie Echevaria B, PA-C  fluticasone (FLONASE) 50 MCG/ACT nasal spray Place 1 spray into both nostrils daily. 04/12/17   Avie Echevaria B, PA-C  hydrOXYzine (ATARAX/VISTARIL) 25 MG tablet Take 1 tablet (25 mg total) every 6 (six) hours by mouth. Patient not taking: Reported on 04/12/2017 01/25/17   Orpah Greek, MD  methimazole (TAPAZOLE) 10 MG tablet TAKE 2 TABLETS (20 MG TOTAL) 2 (TWO) TIMES DAILY BY MOUTH. Patient not taking: Reported on 04/12/2017 04/09/17   Elayne Snare, MD  triamcinolone cream (KENALOG) 0.1 % Apply 1 application 4 (four) times daily topically. As needed for itching Patient not taking: Reported on 04/12/2017 01/29/17   Renato Shin, MD    Family History Family History  Problem Relation Age of Onset  . Thyroid disease Neg Hx     Social History Social History   Tobacco Use  . Smoking status: Current Some Day Smoker    Packs/day: 0.00    Types: Cigars  . Smokeless tobacco: Never Used  Substance Use Topics  . Alcohol use: Yes    Comment: Once or twice a month  . Drug use: No     Allergies   Patient has no known allergies.   Review  of Systems Review of Systems  Constitutional: Positive for chills and fever.       Subjective fevers and chills.  HENT: Positive for congestion and rhinorrhea. Negative for ear pain and sore throat.   Eyes: Negative for visual disturbance.  Respiratory: Positive for cough, shortness of breath and wheezing. Negative for choking and chest tightness.   Cardiovascular: Negative for chest pain and leg swelling.  Gastrointestinal: Positive for nausea. Negative for abdominal distention, abdominal pain, diarrhea and vomiting.  Genitourinary: Negative for dysuria, flank pain and hematuria.  Musculoskeletal: Negative for arthralgias, gait problem,  myalgias, neck pain and neck stiffness.  Skin: Negative for color change, pallor and rash.  Neurological: Negative for dizziness, weakness, light-headedness and headaches.  Psychiatric/Behavioral: Negative for behavioral problems.     Physical Exam Updated Vital Signs BP 114/88 (BP Location: Left Arm)   Pulse (!) 108   Temp 100 F (37.8 C) (Oral)   Resp 18   SpO2 100%   Physical Exam  Constitutional: She is oriented to person, place, and time. She appears well-developed and well-nourished.  Non-toxic appearance. She does not appear ill. No distress.  Afebrile, nontoxic-appearing, sitting comfortably in chair in no acute distress.  HENT:  Head: Normocephalic and atraumatic.  Mouth/Throat: Oropharynx is clear and moist. No oropharyngeal exudate or posterior oropharyngeal edema.  Eyes: Conjunctivae and EOM are normal.  Neck: Normal range of motion. Neck supple.  Cardiovascular: Normal rate, regular rhythm and normal heart sounds.  No murmur heard. Pulmonary/Chest: Effort normal and breath sounds normal. No accessory muscle usage or stridor. No tachypnea and no bradypnea. No respiratory distress. She has no decreased breath sounds.  Abdominal: She exhibits no distension.  Musculoskeletal: Normal range of motion. She exhibits no edema.  Lymphadenopathy:    She has no cervical adenopathy.  Neurological: She is alert and oriented to person, place, and time.  Skin: Skin is warm and dry. No rash noted. She is not diaphoretic. No erythema. No pallor.  Psychiatric: She has a normal mood and affect.  Nursing note and vitals reviewed.    ED Treatments / Results  Labs (all labs ordered are listed, but only abnormal results are displayed) Labs Reviewed  POC URINE PREG, ED    EKG  EKG Interpretation None       Radiology Dg Chest 2 View  Result Date: 04/12/2017 CLINICAL DATA:  Shortness of breath.  Cough and chest congestion. EXAM: CHEST  2 VIEW COMPARISON:  08/26/2015 FINDINGS:  The heart size and mediastinal contours are within normal limits. Both lungs are clear except for slight peribronchial thickening. The visualized skeletal structures are unremarkable. IMPRESSION: Minimal bronchitic changes. Electronically Signed   By: Lorriane Shire M.D.   On: 04/12/2017 12:14    Procedures Procedures (including critical care time)  Medications Ordered in ED Medications  albuterol (PROVENTIL HFA;VENTOLIN HFA) 108 (90 Base) MCG/ACT inhaler 2 puff (2 puffs Inhalation Given 04/12/17 1405)  AEROCHAMBER PLUS FLO-VU MEDIUM MISC 1 each (not administered)  albuterol (PROVENTIL) (2.5 MG/3ML) 0.083% nebulizer solution 5 mg (5 mg Nebulization Given 04/12/17 1205)     Initial Impression / Assessment and Plan / ED Course  I have reviewed the triage vital signs and the nursing notes.  Pertinent labs & imaging results that were available during my care of the patient were reviewed by me and considered in my medical decision making (see chart for details).     Patient presenting with 3 days of URI symptoms and wheezing and chest tightness.  Chest x-ray mild bronchitic changes and EKG unremarkable.  Patient reported significant improvement after initial nebulizing treatment.  No wheezing on my assessment, lungs are CTA bilaterally.  She reports a recent diagnosis by her endocrinologist of asthma after spirometry and referral to primary care for further evaluation and treatment.  Also reporting history of unprotected intercourse and nausea for the last 3 days.  Currently has an IUD Mirena for the last 7 years. POC pregnancy: Negative  Patient significantly improved in the ED and will be discharged with MDI and spacer.  She has an appointment with primary care on the 21st.  Discharge home with symptomatic relief and close follow-up with PCP. Discussed strict return precautions and advised to return to the emergency department if experiencing any new or worsening symptoms. Instructions  were understood and patient agreed with discharge plan. Final Clinical Impressions(s) / ED Diagnoses   Final diagnoses:  Viral upper respiratory tract infection    ED Discharge Orders        Ordered    benzonatate (TESSALON) 100 MG capsule  Every 8 hours     04/12/17 1402    fluticasone (FLONASE) 50 MCG/ACT nasal spray  Daily     04/12/17 1402       Dossie Der 04/12/17 1408    Gareth Morgan, MD 04/13/17 2231

## 2017-04-12 NOTE — ED Notes (Signed)
Patient transported to X-ray 

## 2017-04-12 NOTE — ED Triage Notes (Signed)
Patient recently diagnosed with asthma has been experiencing cough/shortness of breath/wheezing/nasal congestion and drainage starting Friday. Patient reports coming to the ER today with "a tightness in my chest" and shortness of breath. Patient does not have a home nebulizer or inhaler. Patient speaking in full sentences in triage and managing airway. Patient reports taking theraflu and cough medicine today.

## 2017-04-12 NOTE — Discharge Instructions (Signed)
As discussed, make sure that you drink plenty of fluids and get some rest.  Use your inhaler at instructed and as needed.  Follow-up with your primary care provider. Return if symptoms worsen, chest pain, shortness of breath, or any other new concerning symptoms in the meantime.

## 2017-04-12 NOTE — ED Notes (Signed)
Breathing treatment started, pt returned from XR.

## 2017-05-03 ENCOUNTER — Ambulatory Visit: Payer: 59 | Admitting: Endocrinology

## 2017-05-03 ENCOUNTER — Encounter: Payer: Self-pay | Admitting: Endocrinology

## 2017-05-03 VITALS — BP 130/86 | HR 69 | Wt 232.2 lb

## 2017-05-03 DIAGNOSIS — E0590 Thyrotoxicosis, unspecified without thyrotoxic crisis or storm: Secondary | ICD-10-CM

## 2017-05-03 NOTE — Progress Notes (Signed)
Subjective:    Patient ID: [name] [name], female    DOB: Jun 16, 1985, 32 y.o.   MRN: [id number]  HPI Pt returns for f/u of hyperthyroidism (dx'ed 2018; she chose tapazole rx; [name] was c/w thyroiditis; she has factors favoring both nodular goiter and Grave's Dz; she chose tapazole rx; spirometry showed moderate obstruction, but no extrapulmonary component).  pt states she feels well in general, except for slight itching.  She is considering a pregnancy now.  Past Medical History:  Diagnosis Date  . Allergic rhinitis     History reviewed. No pertinent surgical history.  Social History   Socioeconomic History  . Marital status: Married    Spouse name: Not on file  . Number of children: Not on file  . Years of education: Not on file  . Highest education level: Not on file  Social Needs  . Financial resource strain: Not on file  . Food insecurity - worry: Not on file  . Food insecurity - inability: Not on file  . Transportation needs - medical: Not on file  . Transportation needs - non-medical: Not on file  Occupational History  . Not on file  Tobacco Use  . Smoking status: Current Some Day Smoker    Packs/day: 0.00    Types: Cigars  . Smokeless tobacco: Never Used  Substance and Sexual Activity  . Alcohol use: Yes    Comment: Once or twice a month  . Drug use: No  . Sexual activity: Yes    Birth control/protection: IUD  Other Topics Concern  . Not on file  Social History Narrative  . Not on file    Current Outpatient Medications on File Prior to Visit  Medication Sig Dispense Refill  . benzonatate (TESSALON) 100 MG capsule Take 1 capsule (100 mg total) by mouth every 8 (eight) hours. 21 capsule 0  . fluticasone (FLONASE) 50 MCG/ACT nasal spray Place 1 spray into both nostrils daily. 16 g 2  . hydrOXYzine (ATARAX/VISTARIL) 25 MG tablet Take 1 tablet (25 mg total) every 6 (six) hours by mouth. 30 tablet 0  . methimazole (TAPAZOLE) 10 MG tablet Take 1 tablet (10 mg  total) by mouth daily. 30 tablet 2  . triamcinolone cream (KENALOG) 0.1 % Apply 1 application 4 (four) times daily topically. As needed for itching 80 g 2   No current facility-administered medications on file prior to visit.     No Known Allergies  Family History  Problem Relation Age of Onset  . Thyroid disease Neg Hx     BP 130/86 (BP Location: Left Arm, Patient Position: Sitting, Cuff Size: Normal)   Pulse 69   Wt 232 lb 3.2 oz (105.3 kg)   SpO2 98%   BMI 38.64 kg/m   Review of Systems Denies fever    Objective:   Physical Exam VITAL SIGNS:  See vs page GENERAL: no distress NECK: Thyroid is 3-4 times normal size, with multinodular surface.       Assessment & Plan:  Hyperthyroidism: due for recheck   Patient Instructions  blood tests are requested for you today.  We'll let you know about the results. If ever you have fever while taking methimazole, stop it and call us, even if the reason is obvious, because of the risk of a rare side-effect. If you want, you can take the radioactive iodine pill in the future.  It works like this: let's check a thyroid "scan" (a special, but easy and painless type of thyroid x  ray).  you go to the x-ray department of the hospital to swallow a pill, which contains a miniscule amount of radiation.  You will not notice any symptoms from this.  You will go back to the x-ray department the next day, to lie down in front of a camera.  The results of this will be sent to me.   Based on the results, i hope to order for you a treatment pill of radioactive iodine.  Although it is a larger amount of radiation, you will again notice no symptoms from this.  The pill is gone from your body in a few days (during which you should stay away from other people), but takes several months to work.  Therefore, please return here approximately 6-8 weeks after the treatment.  This treatment has been available for many years, and the only known side-effect is an  underactive thyroid.  It is possible that i would eventually prescribe for you a thyroid hormone pill, which is very inexpensive.  You don't have to worry about side-effects of this thyroid hormone pill, because it is the same molecule your thyroid makes.   Please come back for a follow-up appointment in 2 months, or sooner if the pregnancy happens.        Radioiodine (I-131) Therapy for Hyperthyroidism Radioiodine (I-131) therapy is a procedure to treat an overactive thyroid gland (hyperthyroidism). The thyroid is a gland in the neck that uses iodine to help control how the body uses food (metabolism). In this procedure, you swallow a pill or liquid that contains I-131. I-131 is manufactured (synthetic) iodine that gives off radiation. This destroys thyroid cells and reverses hyperthyroidism. Tell a health care provider about:  Any allergies you have.  All medicines you are taking, including vitamins, herbs, eye drops, creams, and over-the-counter medicines.  Any problems you or family members have had with anesthetic medicines.  Any blood disorders you have.  Any surgeries you have had.  Any medical conditions you have.  Whether you are pregnant, may be pregnant, or have gone through menopause, if this applies.  Whether you currently have children.  Whether you plan to have children in the next 2 years.  Any contact you have with children or pregnant women.  Your travel plans for the next 3 months.  Whether you pass through radiation detectors for work or travel. What are the risks? Generally, this is a safe procedure. However, problems may occur, including:  Damage to other structures or organs, such as the salivary glands. This could lead to dry mouth and loss of taste.  Low sperm count, if this applies. This may lead to temporary infertility.  Sore throat or neck pain. This is temporary.  Slightlyincreased risk of thyroid cancer.  Nausea or vomiting.  What happens  before the procedure?  Ask your health care provider about changing or stopping your regular medicines. This is especially important if you are taking diabetes medicines, blood thinners, or thyroid medicines.  Women may be asked to take a pregnancy test.  Women who are breastfeeding should plan to stop at least 6 weeks before the procedure.  Follow instructions from your health care provider about eating or drinking restrictions.  Plan to avoid contact with others for 1 week after your treatment. It is most important to avoid contact with children and pregnant women. To do this, plan to stay home from work, arrange child care, and sleep alone, if these things apply to you.  Plan to drive yourself home  after treatment. Do not take public transportation. If you need someone to drive you home, sit as far away from the driver as possible. What happens during the procedure?  You will be given a dose of I-131 to swallow. It may be a pill or a liquid.  Your thyroid gland will absorb the I-131 over the next 3 months. The treatment process will be complete in about 6 months. What happens after the procedure?  You may need to stay in the hospital for 24 hours after your treatment. This depends on the requirements in your state.  Follow instructions from your health care provider about: ? How to take care of yourself after the procedure. ? How to protect others from exposure to radiation as it leaves your body. This information is not intended to replace advice given to you by your health care provider. Make sure you discuss any questions you have with your health care provider. Document Released: 07/19/2008 Document Revised: 08/06/2015 Document Reviewed: 06/27/2014 Elsevier Interactive Patient Education  [employer].

## 2017-05-03 NOTE — Patient Instructions (Addendum)
blood tests are requested for you today.  We'll let you know about the results. If ever you have fever while taking methimazole, stop it and call us, even if the reason is obvious, because of the risk of a rare side-effect. If you want, you can take the radioactive iodine pill in the future.  It works like this: let's check a thyroid "scan" (a special, but easy and painless type of thyroid x ray).  you go to the x-ray department of the hospital to swallow a pill, which contains a miniscule amount of radiation.  You will not notice any symptoms from this.  You will go back to the x-ray department the next day, to lie down in front of a camera.  The results of this will be sent to me.   Based on the results, i hope to order for you a treatment pill of radioactive iodine.  Although it is a larger amount of radiation, you will again notice no symptoms from this.  The pill is gone from your body in a few days (during which you should stay away from other people), but takes several months to work.  Therefore, please return here approximately 6-8 weeks after the treatment.  This treatment has been available for many years, and the only known side-effect is an underactive thyroid.  It is possible that i would eventually prescribe for you a thyroid hormone pill, which is very inexpensive.  You don't have to worry about side-effects of this thyroid hormone pill, because it is the same molecule your thyroid makes.   Please come back for a follow-up appointment in 2 months, or sooner if the pregnancy happens.        Radioiodine (I-131) Therapy for Hyperthyroidism Radioiodine (I-131) therapy is a procedure to treat an overactive thyroid gland (hyperthyroidism). The thyroid is a gland in the neck that uses iodine to help control how the body uses food (metabolism). In this procedure, you swallow a pill or liquid that contains I-131. I-131 is manufactured (synthetic) iodine that gives off radiation. This destroys  thyroid cells and reverses hyperthyroidism. Tell a health care provider about:  Any allergies you have.  All medicines you are taking, including vitamins, herbs, eye drops, creams, and over-the-counter medicines.  Any problems you or family members have had with anesthetic medicines.  Any blood disorders you have.  Any surgeries you have had.  Any medical conditions you have.  Whether you are pregnant, may be pregnant, or have gone through menopause, if this applies.  Whether you currently have children.  Whether you plan to have children in the next 2 years.  Any contact you have with children or pregnant women.  Your travel plans for the next 3 months.  Whether you pass through radiation detectors for work or travel. What are the risks? Generally, this is a safe procedure. However, problems may occur, including:  Damage to other structures or organs, such as the salivary glands. This could lead to dry mouth and loss of taste.  Low sperm count, if this applies. This may lead to temporary infertility.  Sore throat or neck pain. This is temporary.  Slightlyincreased risk of thyroid cancer.  Nausea or vomiting.  What happens before the procedure?  Ask your health care provider about changing or stopping your regular medicines. This is especially important if you are taking diabetes medicines, blood thinners, or thyroid medicines.  Women may be asked to take a pregnancy test.  Women who are breastfeeding should plan  to stop at least 6 weeks before the procedure.  Follow instructions from your health care provider about eating or drinking restrictions.  Plan to avoid contact with others for 1 week after your treatment. It is most important to avoid contact with children and pregnant women. To do this, plan to stay home from work, arrange child care, and sleep alone, if these things apply to you.  Plan to drive yourself home after treatment. Do not take public  transportation. If you need someone to drive you home, sit as far away from the driver as possible. What happens during the procedure?  You will be given a dose of I-131 to swallow. It may be a pill or a liquid.  Your thyroid gland will absorb the I-131 over the next 3 months. The treatment process will be complete in about 6 months. What happens after the procedure?  You may need to stay in the hospital for 24 hours after your treatment. This depends on the requirements in your state.  Follow instructions from your health care provider about: ? How to take care of yourself after the procedure. ? How to protect others from exposure to radiation as it leaves your body. This information is not intended to replace advice given to you by your health care provider. Make sure you discuss any questions you have with your health care provider. Document Released: 07/19/2008 Document Revised: 08/06/2015 Document Reviewed: 06/27/2014 Elsevier Interactive Patient Education  [employer].

## 2017-05-04 LAB — T4, FREE: FREE T4: 0.4 ng/dL — AB (ref 0.60–1.60)

## 2017-05-04 LAB — TSH: TSH: 8.88 u[IU]/mL — ABNORMAL HIGH (ref 0.35–4.50)

## 2017-05-06 DIAGNOSIS — Z13220 Encounter for screening for lipoid disorders: Secondary | ICD-10-CM | POA: Diagnosis not present

## 2017-05-06 DIAGNOSIS — J209 Acute bronchitis, unspecified: Secondary | ICD-10-CM | POA: Diagnosis not present

## 2017-05-24 ENCOUNTER — Other Ambulatory Visit: Payer: Self-pay | Admitting: Obstetrics and Gynecology

## 2017-05-24 ENCOUNTER — Other Ambulatory Visit (HOSPITAL_COMMUNITY)
Admission: RE | Admit: 2017-05-24 | Discharge: 2017-05-24 | Disposition: A | Discharge status: Home / Self Care | Payer: 59 | Source: Ambulatory Visit | Attending: Obstetrics and Gynecology | Admitting: Obstetrics and Gynecology

## 2017-05-24 DIAGNOSIS — D219 Benign neoplasm of connective and other soft tissue, unspecified: Secondary | ICD-10-CM | POA: Diagnosis not present

## 2017-05-24 DIAGNOSIS — T8332XA Displacement of intrauterine contraceptive device, initial encounter: Secondary | ICD-10-CM | POA: Diagnosis not present

## 2017-05-24 DIAGNOSIS — Z124 Encounter for screening for malignant neoplasm of cervix: Secondary | ICD-10-CM | POA: Insufficient documentation

## 2017-05-27 LAB — CYTOLOGY - PAP
ADEQUACY: ABSENT
Diagnosis: NEGATIVE
HPV (WINDOPATH): NOT DETECTED

## 2017-06-09 IMAGING — CR DG CHEST 2V
2 series · 2 of 2 positions shown · non-contrast
Comparison: 02/29/2008

CLINICAL DATA: Shortness of breath and chest tightness

EXAM:
CHEST  2 VIEW

[w chest pa]
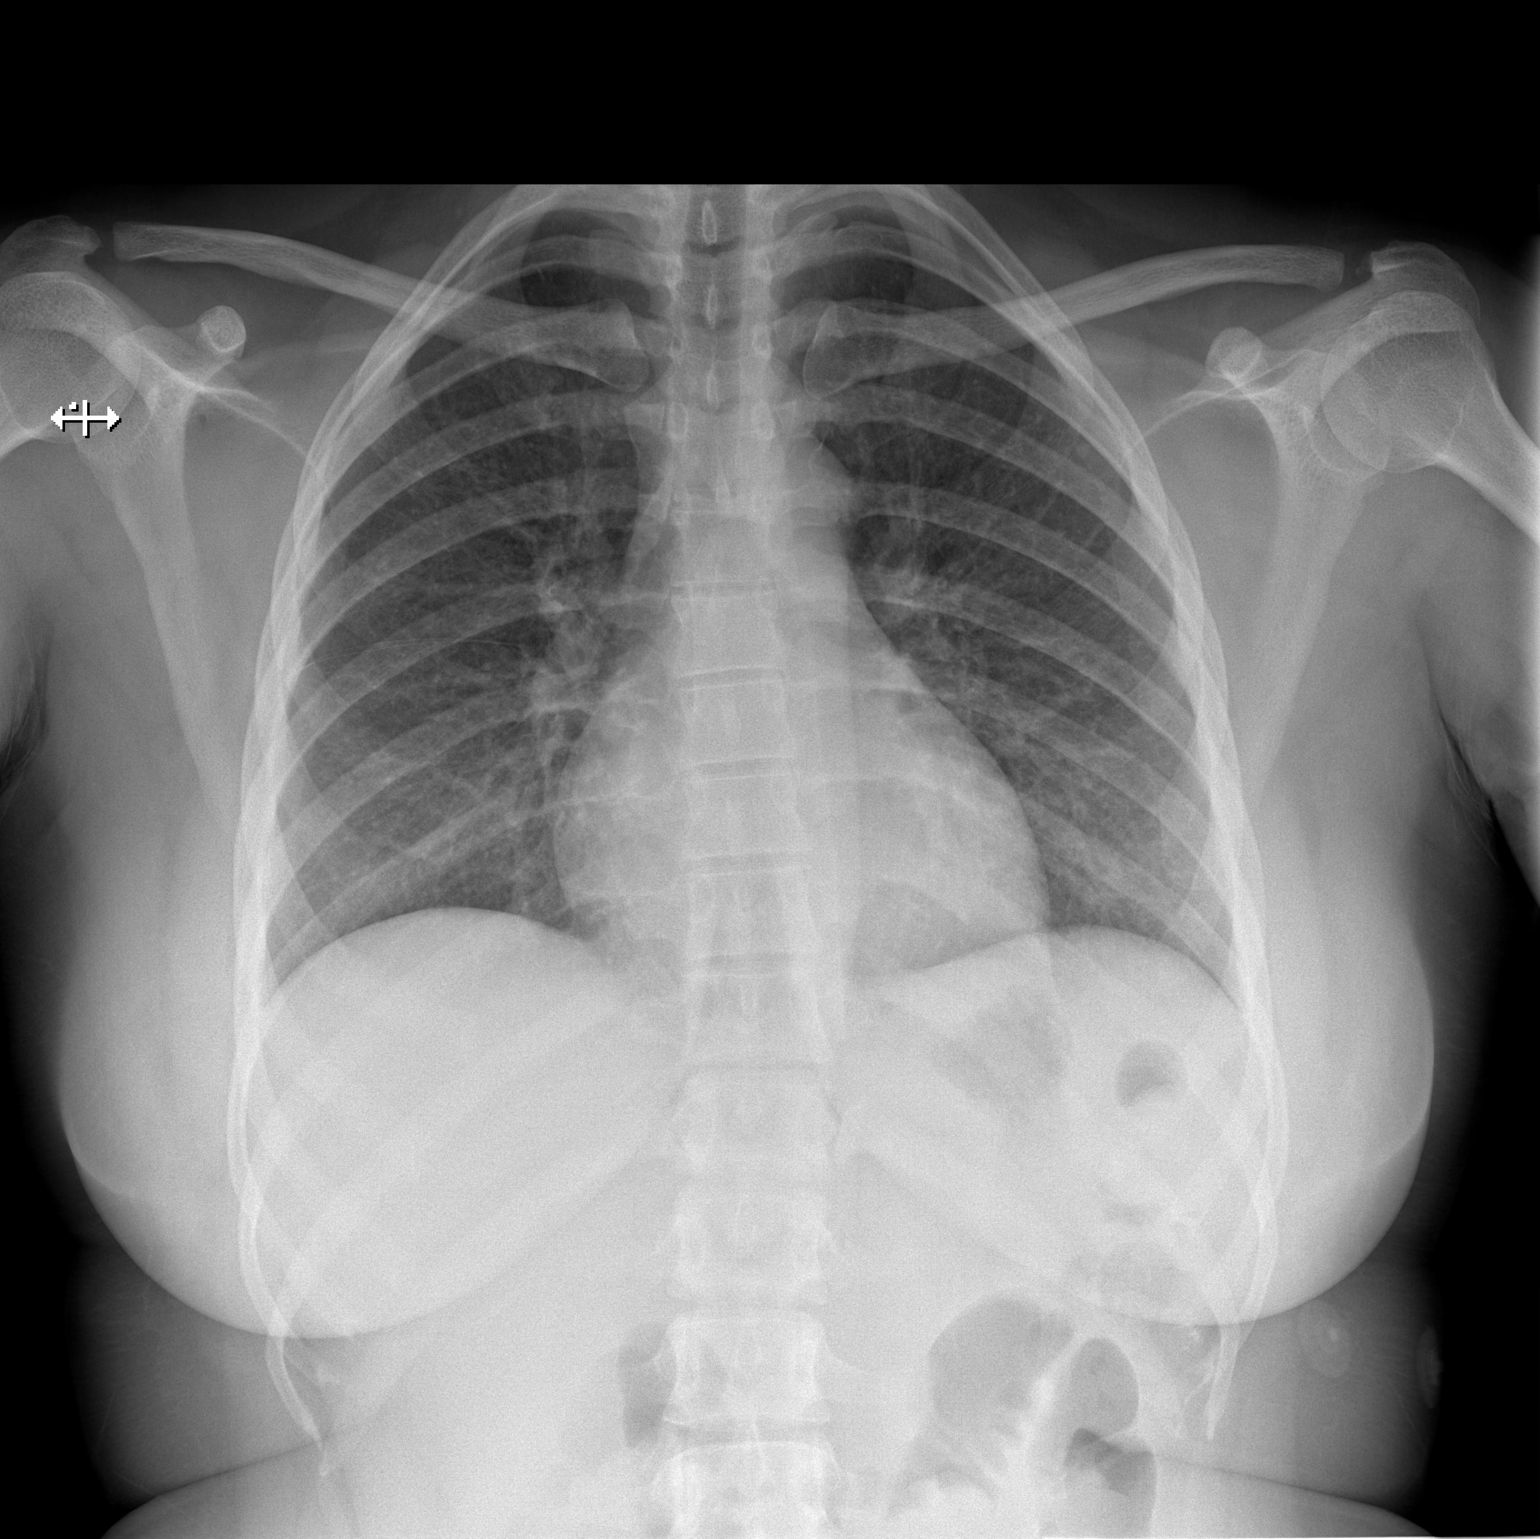

[w chest lat]
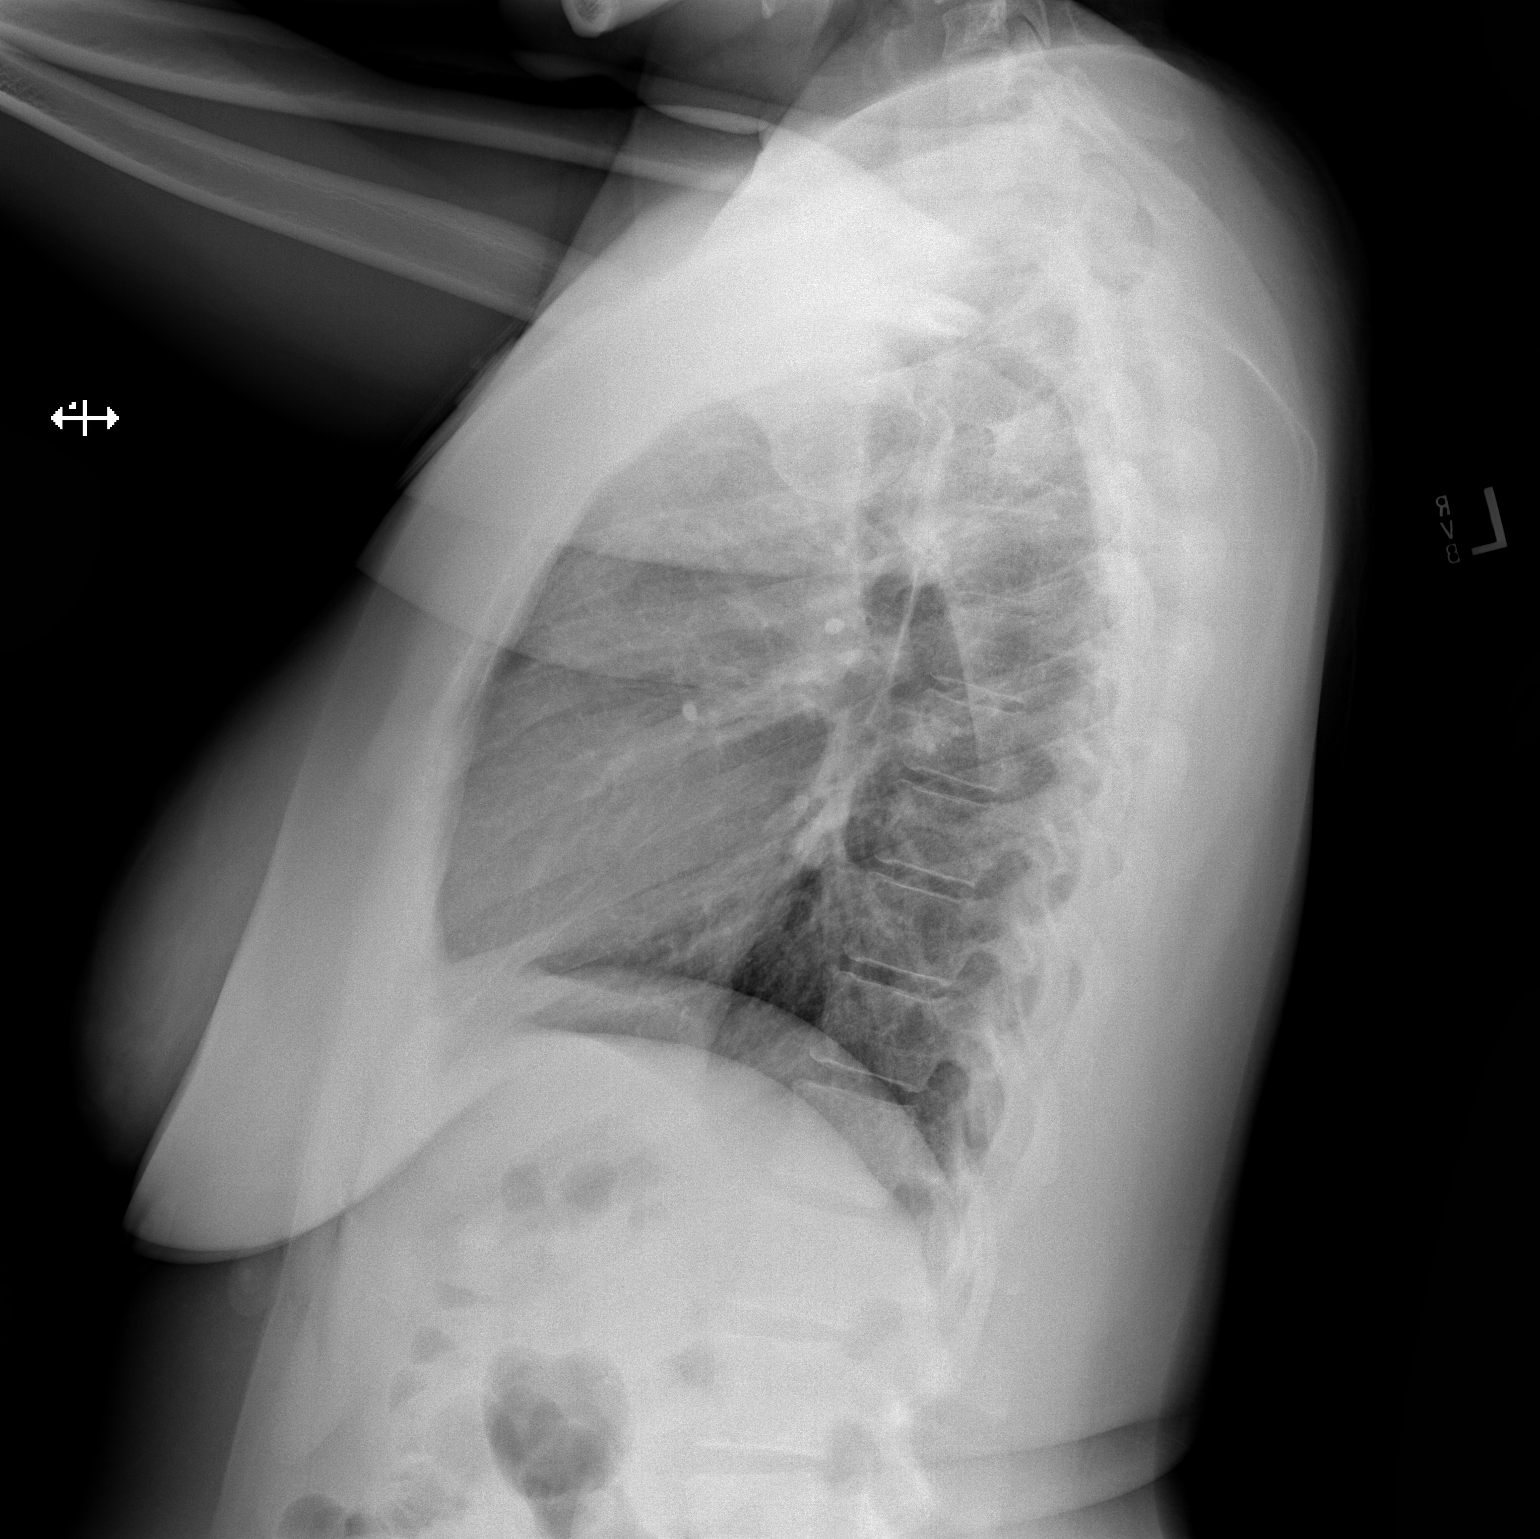

[2 of 2 positions shown; findings below may reference images not displayed]

FINDINGS: The heart size and mediastinal contours are within normal limits.
Both lungs are clear. The visualized skeletal structures are
unremarkable.
IMPRESSION: No active cardiopulmonary disease.

## 2017-06-11 ENCOUNTER — Ambulatory Visit (INDEPENDENT_AMBULATORY_CARE_PROVIDER_SITE_OTHER): Payer: 59 | Admitting: Endocrinology

## 2017-06-11 ENCOUNTER — Encounter: Payer: Self-pay | Admitting: Endocrinology

## 2017-06-11 VITALS — BP 120/76 | HR 75 | Wt 227.8 lb

## 2017-06-11 DIAGNOSIS — E0590 Thyrotoxicosis, unspecified without thyrotoxic crisis or storm: Secondary | ICD-10-CM | POA: Diagnosis not present

## 2017-06-11 LAB — TSH: TSH: 0.02 u[IU]/mL — ABNORMAL LOW (ref 0.35–4.50)

## 2017-06-11 LAB — T4, FREE: FREE T4: 0.89 ng/dL (ref 0.60–1.60)

## 2017-06-11 MED ORDER — METHIMAZOLE 5 MG PO TABS: 5.0000 mg | ORAL_TABLET | Freq: Every day | ORAL | 5 refills | Status: DC

## 2017-06-11 NOTE — Progress Notes (Signed)
Subjective:    Patient ID: [name] [name], female    DOB: Oct 17, 1985, 32 y.o.   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  pt states she feels well in general, except for slight itching.  She is not considering a pregnancy now, but is planning for later this year.  She will have IUD removed soon.  Past Medical History:  Diagnosis Date  . Allergic rhinitis     No past surgical history on file.  Social History   Socioeconomic History  . Marital status: Married    Spouse name: Not on file  . Number of children: Not on file  . Years of education: Not on file  . Highest education level: Not on file  Occupational History  . Not on file  Social Needs  . Financial resource strain: Not on file  . Food insecurity:    Worry: Not on file    Inability: Not on file  . Transportation needs:    Medical: Not on file    Non-medical: Not on file  Tobacco Use  . Smoking status: Current Some Day Smoker    Packs/day: 0.00    Types: Cigars  . Smokeless tobacco: Never Used  Substance and Sexual Activity  . Alcohol use: Yes    Comment: Once or twice a month  . Drug use: No  . Sexual activity: Yes    Birth control/protection: IUD  Lifestyle  . Physical activity:    Days per week: Not on file    Minutes per session: Not on file  . Stress: Not on file  Relationships  . Social connections:    Talks on phone: Not on file    Gets together: Not on file    Attends religious service: Not on file    Active member of club or organization: Not on file    Attends meetings of clubs or organizations: Not on file    Relationship status: Not on file  . Intimate partner violence:    Fear of current or ex partner: Not on file    Emotionally abused: Not on file   Physically abused: Not on file    Forced sexual activity: Not on file  Other Topics Concern  . Not on file  Social History Narrative  . Not on file    Current Outpatient Medications on File Prior to Visit  Medication Sig Dispense Refill  . benzonatate (TESSALON) 100 MG capsule Take 1 capsule (100 mg total) by mouth every 8 (eight) hours. 21 capsule 0  . fluticasone (FLONASE) 50 MCG/ACT nasal spray Place 1 spray into both nostrils daily. (Patient not taking: Reported on 06/11/2017) 16 g 2  . hydrOXYzine (ATARAX/VISTARIL) 25 MG tablet Take 1 tablet (25 mg total) every 6 (six) hours by mouth. 30 tablet 0  . triamcinolone cream (KENALOG) 0.1 % Apply 1 application 4 (four) times daily topically. As needed for itching (Patient not taking: Reported on 06/11/2017) 80 g 2   No current facility-administered medications on file prior to visit.     No Known Allergies  Family History  Problem Relation Age of Onset  . Thyroid disease Neg Hx     BP 120/76 (BP Location: Left Arm, Patient Position: Sitting, Cuff Size: Normal)   Pulse 75   Wt 227  lb 12.8 oz (103.3 kg)   SpO2 97%   BMI 37.91 kg/m    Review of Systems Denies fever    Objective:   Physical Exam VITAL SIGNS:  See vs page GENERAL: no distress NECK: Thyroid is approx twice normal size, with multinodular surface.       Assessment & Plan:  Hyperthyroidism: due for recheck.  Patient Instructions  blood tests are requested for you today.  We'll let you know about the results. Based on the results, we may need to resume the methimizole If ever you have fever while taking methimazole, stop it and call us, even if the reason is obvious, because of the risk of a rare side-effect.  Please come back for a follow-up appointment in 2 months, or sooner if the pregnancy happens.

## 2017-06-11 NOTE — Patient Instructions (Addendum)
blood tests are requested for you today.  We'll let you know about the results. Based on the results, we may need to resume the methimizole If ever you have fever while taking methimazole, stop it and call us, even if the reason is obvious, because of the risk of a rare side-effect.  Please come back for a follow-up appointment in 2 months, or sooner if the pregnancy happens.

## 2017-06-22 DIAGNOSIS — Z0000 Encounter for general adult medical examination without abnormal findings: Secondary | ICD-10-CM | POA: Diagnosis not present

## 2017-07-12 ENCOUNTER — Ambulatory Visit: Payer: 59 | Admitting: Endocrinology

## 2017-08-13 ENCOUNTER — Ambulatory Visit: Payer: 59 | Admitting: Endocrinology

## 2017-08-18 ENCOUNTER — Encounter: Payer: Self-pay | Admitting: Endocrinology

## 2017-08-18 ENCOUNTER — Ambulatory Visit: Payer: 59 | Admitting: Endocrinology

## 2017-08-18 VITALS — BP 112/70 | HR 88 | Wt 238.6 lb

## 2017-08-18 DIAGNOSIS — E0590 Thyrotoxicosis, unspecified without thyrotoxic crisis or storm: Secondary | ICD-10-CM | POA: Diagnosis not present

## 2017-08-18 DIAGNOSIS — D251 Intramural leiomyoma of uterus: Secondary | ICD-10-CM | POA: Diagnosis not present

## 2017-08-18 LAB — TSH: TSH: 4.07 u[IU]/mL (ref 0.35–4.50)

## 2017-08-18 LAB — T4, FREE: Free T4: 0.73 ng/dL (ref 0.60–1.60)

## 2017-08-18 MED ORDER — METHIMAZOLE 5 MG PO TABS: 5.0000 mg | ORAL_TABLET | ORAL | 5 refills | Status: DC

## 2017-08-18 NOTE — Patient Instructions (Addendum)
blood tests are requested for you today.  We'll let you know about the results.  If ever you have fever while taking methimazole, stop it and call us, even if the reason is obvious, because of the risk of a rare side-effect.  Please come back for a follow-up appointment in 2 months.  Please call sooner if the pregnancy happens.

## 2017-08-18 NOTE — Progress Notes (Signed)
Subjective:    Patient ID: [name] [name], female    DOB: December 02, 1985, 32 y.o.   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  pt states she feels well in general, except for slight itching.  She is not considering a pregnancy now, but is planning for later this year.  She will have IUD removed soon.   Past Medical History:  Diagnosis Date  . Allergic rhinitis     History reviewed. No pertinent surgical history.  Social History   Socioeconomic History  . Marital status: Married    Spouse name: Not on file  . Number of children: Not on file  . Years of education: Not on file  . Highest education level: Not on file  Occupational History  . Not on file  Social Needs  . Financial resource strain: Not on file  . Food insecurity:    Worry: Not on file    Inability: Not on file  . Transportation needs:    Medical: Not on file    Non-medical: Not on file  Tobacco Use  . Smoking status: Current Some Day Smoker    Packs/day: 0.00    Types: Cigars  . Smokeless tobacco: Never Used  Substance and Sexual Activity  . Alcohol use: Yes    Comment: Once or twice a month  . Drug use: No  . Sexual activity: Yes    Birth control/protection: IUD  Lifestyle  . Physical activity:    Days per week: Not on file    Minutes per session: Not on file  . Stress: Not on file  Relationships  . Social connections:    Talks on phone: Not on file    Gets together: Not on file    Attends religious service: Not on file    Active member of club or organization: Not on file    Attends meetings of clubs or organizations: Not on file    Relationship status: Not on file  . Intimate partner violence:    Fear of current or ex partner: Not on file    Emotionally abused: Not  on file    Physically abused: Not on file    Forced sexual activity: Not on file  Other Topics Concern  . Not on file  Social History Narrative  . Not on file    Current Outpatient Medications on File Prior to Visit  Medication Sig Dispense Refill  . fluticasone (FLONASE) 50 MCG/ACT nasal spray Place 1 spray into both nostrils daily. 16 g 2  . benzonatate (TESSALON) 100 MG capsule Take 1 capsule (100 mg total) by mouth every 8 (eight) hours. (Patient not taking: Reported on 08/18/2017) 21 capsule 0  . hydrOXYzine (ATARAX/VISTARIL) 25 MG tablet Take 1 tablet (25 mg total) every 6 (six) hours by mouth. (Patient not taking: Reported on 08/18/2017) 30 tablet 0  . triamcinolone cream (KENALOG) 0.1 % Apply 1 application 4 (four) times daily topically. As needed for itching (Patient not taking: Reported on 06/11/2017) 80 g 2   No current facility-administered medications on file prior to visit.     No Known Allergies  Family History  Problem Relation Age of Onset  . Thyroid disease Neg Hx     BP 112/70   Pulse 88   Wt 238 lb  9.6 oz (108.2 kg)   SpO2 97%   BMI 39.71 kg/m    Review of Systems Denies fever    Objective:   Physical Exam VITAL SIGNS:  See vs page GENERAL: no distress.  NECK: Thyroid is 2-4 times normal size, with multinodular surface.        Assessment & Plan:  Multinodular goiter: clinically stable Hyperthyroidism: recheck today  Patient Instructions  blood tests are requested for you today.  We'll let you know about the results.  If ever you have fever while taking methimazole, stop it and call us, even if the reason is obvious, because of the risk of a rare side-effect.  Please come back for a follow-up appointment in 2 months.  Please call sooner if the pregnancy happens.

## 2017-11-05 ENCOUNTER — Ambulatory Visit: Payer: 59 | Admitting: Endocrinology

## 2017-11-12 NOTE — Patient Instructions (Addendum)
Your procedure is scheduled on: Wednesday, 9/18  Enter through the Main Entrance of [facility] at: 7 am  Pick up the phone at the desk and dial [phone].  Call this number if you have problems the morning of surgery: [phone].  Remember: Do NOT eat food or Do NOT drink clear liquids (including water) after midnight Tuesday.  Take these medicines the morning of surgery with a SIP OF WATER: Ok to use eye drops and flonase nasal spray if needed.  Brush your teeth on the morning of surgery.  Do Not smoke on the day of surgery.  Stop herbal medications, vitamin supplements, Ibuprofen/NSAIDS at this time.  Do NOT wear jewelry (body piercing), metal hair clips/bobby pins, make-up, or nail polish. Do NOT wear lotions, powders, or perfumes.  You may wear deoderant. Do NOT shave for 48 hours prior to surgery. Do NOT bring valuables to the hospital. Contacts may not be worn into surgery.  Leave suitcase in car.  After surgery it may be brought to your room.  For patients admitted to the hospital, checkout time is 11:00 AM the day of discharge. Home with husband Barbaraann Rondo cell [phone].

## 2017-11-16 ENCOUNTER — Other Ambulatory Visit: Payer: Self-pay | Admitting: Obstetrics and Gynecology

## 2017-11-16 DIAGNOSIS — Z30431 Encounter for routine checking of intrauterine contraceptive device: Secondary | ICD-10-CM | POA: Diagnosis not present

## 2017-11-16 DIAGNOSIS — D251 Intramural leiomyoma of uterus: Secondary | ICD-10-CM | POA: Diagnosis not present

## 2017-11-16 DIAGNOSIS — N898 Other specified noninflammatory disorders of vagina: Secondary | ICD-10-CM | POA: Diagnosis not present

## 2017-11-16 NOTE — H&P (Deleted)
  The note originally documented on this encounter has been moved the the encounter in which it belongs.  

## 2017-11-16 NOTE — H&P (Signed)
Patient:[name] [name] DOB: [birth year] Age: 32 Y Sex: Female Scribe  Phone: [phone] Primary Insurance: [facility] Payer ID: [id number] Address: [address] [place], [place] Account Number: [id number] Encounter Date: 11/16/2017 Provider: Christophe Louis, MD Appointment [facility]: [facility]   Subjective: Chief Complaint(s):   fibroids/ retained IUD   HPI:  General 32 y/o presents for preop history and physical exam in preparation for abdominal myomectomy to manage uterine fibroids. she had an ultrasound 06/08/2017 Her uterus measured 16.9 cm x 7 cm x 11 cm. she has a posterior lower uterine segment fibroid that is 11.3 cm. fundal fibroid is 7 cm. anterior fibroid is 2.5 cm. Iud is seen in place.. the fibroids displace her cervix anteriorly and attempts at IUD removal have not been successful. she is interested in future fertility. Her ovaries appeared normal on ultrasound. Current Medication: Taking  Mirena (52 MG)(Levonorgestrel) 20 MCG/24HR Intrauterine Device as directed Intrauterine     Monistat 7(Miconazole Nitrate) , Notes: prn     Methimazole 5 MG Tablet 1 tablet with food Orally Once a day     Medication List reviewed and reconciled with the patient   Medical History:  hyperthyroidism     carpal tunnel      Allergies/Intolerance:  N.K.D.A.   Gyn History:  Sexual activity currently sexually active. Periods : irregular. LMP 05/08/17-spotting . Birth control Mirena IUD. Last pap smear date 05/24/17-neg. STD Chlamydia.   OB History:  Number of pregnancies 2. Pregnancy # 1 abortion. Pregnancy # 2 live birth, girl, vaginal delivery.   Surgical History:  oral surgery ( wisdom tooth extraction )     D&C   Hospitalization:  childbirth 04/2002   Family History:  Father: alive, heart disease    Mother: alive    Paternal Grand Father: unknown    Paternal Grand Mother: deceased    Maternal Grand Father: deceased, emphysema (?), smoker    Maternal Grand Mother:  deceased, arthritis    Brother 1: alive    Brother2: alive    2 brother(s) . 1daughter(s) .    denies any GYN family cancer hx.  Social History: General Alcohol: yes, yes.  Children: 1, daughter.  Caffeine: yes, tea, coffee, soda, 2 servings daily.  EDUCATION: [place], going to school full time for criminal justice.  Seat belt use: yes.  Tobacco use cigarettes: Current smoker, Frequency: 1/2 PPD, Tobacco history last updated 11/16/2017.  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  Home smoke detector use: yes.  no Recreational drug use.  OCCUPATION: employed, [employer].   ROS: CONSTITUTIONAL No" options="no,yes" propid="91" itemid="193425" categoryid="10464" encounterid="[id number]"Chills No. No" options="no,yes" propid="91" itemid="172899" categoryid="10464" encounterid="[id number]"Fatigue No. No" options="no,yes" propid="91" itemid="10467" categoryid="10464" encounterid="[id number]"Fever No. No" options="no,yes" propid="91" itemid="193426" categoryid="10464" encounterid="[id number]"Night sweats No. No" options="no,yes" propid="91" itemid="444261" categoryid="10464" encounterid="[id number]"Recent travel outside [name] No. No" options="no,yes" propid="91" itemid="193427" categoryid="10464" encounterid="[id number]"Sweats No. No" options="no,yes" propid="91" itemid="194825" categoryid="10464" encounterid="[id number]"Weight change No.  OPHTHALMOLOGY no" options="no,yes" propid="91" itemid="12520" categoryid="12516" encounterid="[id number]"Blurring of vision no. no" options="no,yes" propid="91" itemid="193469" categoryid="12516" encounterid="[id number]"Change in vision no. no" options="no,yes" propid="91" itemid="194379" categoryid="12516" encounterid="[id number]"Double vision no.  ENT no" options="no,yes" propid="91" itemid="193612" categoryid="10481" encounterid="[id number]"Dizziness no. Nose bleeds no. Sore throat no. Teeth pain no.  ALLERGY no" options="no,yes" propid="91" itemid="202589"  categoryid="138152" encounterid="[id number]"Hives no.  CARDIOLOGY no" options="no,yes" propid="91" itemid="193603" categoryid="10488" encounterid="[id number]"Chest pain no. no" options="no,yes" propid="91" itemid="199089" categoryid="10488" encounterid="[id number]"High blood pressure no. no" options="no,yes" propid="91" itemid="202598" categoryid="10488" encounterid="[id number]"Irregular heart beat no. no" options="no,yes" propid="91" itemid="10491" categoryid="10488" encounterid="[id number]"Leg edema no. no" options="no,yes" propid="91" itemid="10490" categoryid="10488" encounterid="[id number]"Palpitations no.  RESPIRATORY no" options="no"  propid="91" itemid="270013" categoryid="138132" encounterid="[id number]"Shortness of breath no. no" options="no,yes" propid="91" itemid="172745" categoryid="138132" encounterid="[id number]"Cough no. no" options="no,yes" propid="91" itemid="193621" categoryid="138132" encounterid="[id number]"Wheezing no.  UROLOGY no" options="no,yes" propid="91" itemid="194377" categoryid="138166" encounterid="[id number]"Pain with urination no. no" options="no,yes" propid="91" itemid="193493" categoryid="138166" encounterid="[id number]"Urinary urgency no. no" options="no,yes" propid="91" itemid="193492" categoryid="138166" encounterid="[id number]"Urinary frequency no. no" options="no,yes" propid="91" itemid="138171" categoryid="138166" encounterid="[id number]"Urinary incontinence no. No" options="no,yes" propid="91" itemid="138167" categoryid="138166" encounterid="[id number]"Difficulty urinating No. No" options="no,yes" propid="91" itemid="138168" categoryid="138166" encounterid="[id number]"Blood in urine No.  GASTROENTEROLOGY no" options="no,yes" propid="91" itemid="10496" categoryid="10494" encounterid="[id number]"Abdominal pain no. no" options="no,yes" propid="91" itemid="193447" categoryid="10494" encounterid="[id number]"Appetite change no. no" options="no,yes" propid="91" itemid="193448" categoryid="10494"  encounterid="[id number]"Bloating/belching no. no" options="no,yes" propid="91" itemid="10503" categoryid="10494" encounterid="[id number]"Blood in stool or on toilet paper no. no" options="no,yes" propid="91" itemid="199106" categoryid="10494" encounterid="[id number]"Change in bowel movements no. no" options="no,yes" propid="91" itemid="10501" categoryid="10494" encounterid="[id number]"Constipation no. no" options="no,yes" propid="91" itemid="10502" categoryid="10494" encounterid="[id number]"Diarrhea no. no" options="no,yes" propid="91" itemid="199104" categoryid="10494" encounterid="[id number]"Difficulty swallowing no. no" options="no,yes" propid="91" itemid="10499" categoryid="10494" encounterid="[id number]"Nausea no.  FEMALE REPRODUCTIVE no" options="no,yes" propid="91" itemid="453725" categoryid="10525" encounterid="[id number]"Vulvar pain no. no" options="no,yes" propid="91" itemid="453726" categoryid="10525" encounterid="[id number]"Vulvar rash no. no" options="no, yes" propid="91" itemid="444315" categoryid="10525" encounterid="[id number]"Abnormal vaginal bleeding no. no" options="no,yes" propid="91" itemid="186083" categoryid="10525" encounterid="[id number]"Breast pain no. no" options="no,yes" propid="91" itemid="186084" categoryid="10525" encounterid="[id number]"Nipple discharge no. no" options="no,yes" propid="91" itemid="275823" categoryid="10525" encounterid="[id number]"Pain with intercourse no. no" options="no,yes" propid="91" itemid="186082" categoryid="10525" encounterid="[id number]"Pelvic pain no. no" options="no,yes" propid="91" itemid="278230" categoryid="10525" encounterid="[id number]"Unusual vaginal discharge no. no" options="no,yes" propid="91" itemid="278942" categoryid="10525" encounterid="[id number]"Vaginal itching no.  MUSCULOSKELETAL no" options="no,yes" propid="91" itemid="193461" categoryid="10514" encounterid="[id number]"Muscle aches no.  NEUROLOGY no" options="no,yes" propid="91" itemid="12513" categoryid="12512"  encounterid="[id number]"Headache no. no" options="no,yes" propid="91" itemid="12514" categoryid="12512" encounterid="[id number]"Tingling/numbness no. no" options="no,yes" propid="91" itemid="193468" categoryid="12512" encounterid="10559879"Weakness no.  PSYCHOLOGY no" options="" propid="91" itemid="275919" categoryid="10520" encounterid="10559879"Depression no. no" options="no,yes" propid="91" itemid="172748" categoryid="10520" encounterid="10559879"Anxiety no. no" options="no,yes" propid="91" itemid="199158" categoryid="10520" encounterid="10559879"Nervousness no. no" options="no,yes" propid="91" itemid="12502" categoryid="10520" encounterid="10559879"Sleep disturbances no. no " options="no,yes" propid="91" itemid="72718" categoryid="10520" encounterid="10559879"Suicidal ideation no .  ENDOCRINOLOGY no" options="no,yes" propid="91" itemid="194628" categoryid="12508" encounterid="10559879"Excessive thirst no. no" options="no,yes" propid="91" itemid="196285" categoryid="12508" encounterid="10559879"Excessive urination no. no" options="no, yes" propid="91" itemid="444314" categoryid="12508" encounterid="10559879"Hair loss no. no" options="" propid="91" itemid="447284" categoryid="12508" encounterid="[id number]"Heat or cold intolerance no.  HEMATOLOGY/LYMPH no" options="no,yes" propid="91" itemid="199152" categoryid="138157" encounterid="10559879"Abnormal bleeding no. no" options="no,yes" propid="91" itemid="170653" categoryid="138157" encounterid="10559879"Easy bruising no. no" options="no,yes" propid="91" itemid="138158" categoryid="138157" encounterid="10559879"Swollen glands no.  DERMATOLOGY no" options="no,yes" propid="91" itemid="199126" categoryid="12503" encounterid="10559879"New/changing skin lesion no. no" options="no,yes" propid="91" itemid="12504" categoryid="12503" encounterid="10559879"Rash no. no" options="" propid="91" itemid="444313" categoryid="12503" encounterid="[id number]"Sores no.   Negative  except as stated in HPI.  Objective: Vitals: Wt 238, Wt change 13 lb, Ht 65, BMI 39.60, Pulse sitting 83, BP sitting 104/72  Past Results: Examination:  General Examination alert, oriented, NAD " categoryPropId="10089" examid="193638"CONSTITUTIONAL: alert, oriented, NAD .  moist, warm" categoryPropId="10109" examid="193638"SKIN: moist, warm.  Conjunctiva clear" categoryPropId="21468" examid="193638"EYES: Conjunctiva clear.  clear to auscultation bilaterally" categoryPropId="87" examid="193638"LUNGS: clear to auscultation bilaterally.  regular rate and rhythm" categoryPropId="86" examid="193638"HEART: regular rate and rhythm.  soft, non-tender/non-distended, bowel sounds present " categoryPropId="88" examid="193638"ABDOMEN: soft, non-tender/non-distended, bowel sounds present .  normal external genitalia, labia - unremarkable, vagina - pink moist mucosa, no lesions or abnormal discharge, cervix - unable to visualized due to displacement anteriorly ...adnexa - no masses or tenderness, uterus - 16 wk size" categoryPropId="13414" examid="193638"FEMALE GENITOURINARY: normal external genitalia, labia - unremarkable, vagina - pink moist mucosa, no lesions or abnormal discharge, cervix - unable to visualized due to displacement anteriorly ...adnexa - no masses or tenderness, uterus - 16 wk size.  affect normal, good eye contact" categoryPropId="16316" examid="193638"PSYCH: affect normal, good eye contact.  Physical Examination:    Assessment: Assessment:  Fibroids, intramural - D25.1 (Primary)     Vaginal discharge - N89.8  Encounter for management of intrauterine contraceptive device (IUD), unspecified IUD management type - Z30.431     Plan: Treatment: Fibroids, intramural Notes: patient desires future fertilyt. she had a consult with Dr. Kerin Perna to discuss laparoscopic myomectomy. SHe has opted to pursue abdominal myomectomy. d/w pt r/b/a including but not limited to infection, bleeding,  damage to bowel bladder and surrounding organs with the need for further surgery. r/o hysterectomy at the time of attempted myomectomy.. r/o transfustion HIV/ Hep B&C discussed. pt voiced understanding and desires to proceed with abdominal myomectomy and removal of mirena IUD at the time of myomectomy. Vaginal discharge Lab:[facility]  Encounter for management of intrauterine contraceptive device (IUD), unspecified IUD management type Notes: desires to proceed with abdominal myomectomy and removal of mirena IUD at the time of myomectomy

## 2017-11-18 ENCOUNTER — Ambulatory Visit: Payer: 59 | Admitting: Endocrinology

## 2017-11-18 DIAGNOSIS — Z0289 Encounter for other administrative examinations: Secondary | ICD-10-CM

## 2017-11-22 ENCOUNTER — Ambulatory Visit: Payer: 59 | Admitting: Endocrinology

## 2017-11-22 ENCOUNTER — Encounter: Payer: Self-pay | Admitting: Endocrinology

## 2017-11-22 VITALS — BP 124/82 | HR 84 | Ht 65.0 in | Wt 240.8 lb

## 2017-11-22 DIAGNOSIS — E0590 Thyrotoxicosis, unspecified without thyrotoxic crisis or storm: Secondary | ICD-10-CM

## 2017-11-22 LAB — TSH: TSH: 3.11 u[IU]/mL (ref 0.35–4.50)

## 2017-11-22 LAB — T4, FREE: FREE T4: 0.65 ng/dL (ref 0.60–1.60)

## 2017-11-22 MED ORDER — METHIMAZOLE 5 MG PO TABS: 5.0000 mg | ORAL_TABLET | ORAL | 5 refills | Status: DC

## 2017-11-22 NOTE — Progress Notes (Signed)
Subjective:    Patient ID: [name] [name], female    DOB: [birth year], 32 y.o.   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  pt states she feels well in general, except for slight nausea.  She is not considering a pregnancy now, but is planning for later this year.  She will have IUD removed soon, when she has fibroid surgery.  She takes tapazole QD, rather than TIW.   Past Medical History:  Diagnosis Date  . Allergic rhinitis   . Headache   . Hyperthyroidism   . Neuromuscular disorder ([place])    carpel tunnel left hand  . SVD (spontaneous vaginal delivery) 2004   x 1    Past Surgical History:  Procedure Laterality Date  . DILATION AND CURETTAGE OF UTERUS     termination x 1  . WISDOM TOOTH EXTRACTION      Social History   Socioeconomic History  . Marital status: Married    Spouse name: Barbaraann Rondo  . Number of children: 1  . Years of education: Not on file  . Highest education level: Not on file  Occupational History  . Not on file  Social Needs  . Financial resource strain: Not on file  . Food insecurity:    Worry: Not on file    Inability: Not on file  . Transportation needs:    Medical: Not on file    Non-medical: Not on file  Tobacco Use  . Smoking status: Former Smoker    Packs/day: 0.25    Years: 17.00    Pack years: 4.25    Types: Cigars    Last attempt to quit: 10/31/2017    Years since quitting: 0.0  . Smokeless tobacco: Never Used  . Tobacco comment: Not smoked since 10/2017  Substance and Sexual Activity  . Alcohol use: Yes    Comment: Once or twice a month  . Drug use: No  . Sexual activity: Yes    Birth control/protection: IUD    Comment: Mirena IUD  Lifestyle  . Physical activity:    Days per week: Not on file   Minutes per session: Not on file  . Stress: Not on file  Relationships  . Social connections:    Talks on phone: Not on file    Gets together: Not on file    Attends religious service: Not on file    Active member of club or organization: Not on file    Attends meetings of clubs or organizations: Not on file    Relationship status: Not on file  . Intimate partner violence:    Fear of current or ex partner: Not on file    Emotionally abused: Not on file    Physically abused: Not on file    Forced sexual activity: Not on file  Other Topics Concern  . Not on file  Social History Narrative  . Not on file    Current Outpatient Medications on File Prior to Visit  Medication Sig Dispense Refill  . aspirin-acetaminophen-caffeine (EXCEDRIN MIGRAINE) 250-250-65 MG tablet Take 1-2 tablets by mouth every 6 (six) hours as needed for headache.    . fluticasone (FLONASE) 50 MCG/ACT nasal spray Place 1 spray into both nostrils daily. (Patient taking differently: Place 1 spray into both nostrils daily as needed  for allergies. ) 16 g 2  . Polyethyl Glycol-Propyl Glycol (LUBRICANT EYE DROPS) 0.4-0.3 % SOLN Place 1 drop into both eyes 3 (three) times daily as needed (dry eyes.).    Marland Kitchen triamcinolone cream (KENALOG) 0.1 % Apply 1 application 4 (four) times daily topically. As needed for itching 80 g 2   No current facility-administered medications on file prior to visit.     No Known Allergies  Family History  Problem Relation Age of Onset  . Thyroid disease Neg Hx     BP 124/82   Pulse 84   Ht 5\' 5"  (1.651 m)   Wt 240 lb 12.8 oz (109.2 kg)   SpO2 97%   BMI 40.07 kg/m   Review of Systems Denies fever.      Objective:   Physical Exam VITAL SIGNS:  See vs page.   GENERAL: no distress.   NECK: Thyroid is 2-4 times normal size, with multinodular surface.    Lab Results  Component Value Date   TSH 3.11 11/22/2017      Assessment & Plan:  Hyperthyroidism: well-controlled uterine  fibroids: full range of motion the standpoint of the thyroid, she is cleared  Patient Instructions  blood tests are requested for you today.  We'll let you know about the results.  Based on the results, I'll prescribe for you a refill of the methimazole If ever you have fever while taking methimazole, stop it and call us, even if the reason is obvious, because of the risk of a rare side-effect.  Please come back for a follow-up appointment in 3 months.  Please call sooner if the pregnancy happens.

## 2017-11-22 NOTE — Patient Instructions (Addendum)
blood tests are requested for you today.  We'll let you know about the results.  Based on the results, I'll prescribe for you a refill of the methimazole If ever you have fever while taking methimazole, stop it and call us, even if the reason is obvious, because of the risk of a rare side-effect.  Please come back for a follow-up appointment in 3 months.  Please call sooner if the pregnancy happens.

## 2017-11-24 ENCOUNTER — Encounter (HOSPITAL_COMMUNITY)
Admission: RE | Admit: 2017-11-24 | Discharge: 2017-11-24 | Disposition: A | Discharge status: Home / Self Care | Payer: 59 | Source: Ambulatory Visit | Attending: Obstetrics and Gynecology | Admitting: Obstetrics and Gynecology

## 2017-11-24 ENCOUNTER — Other Ambulatory Visit: Payer: Self-pay

## 2017-11-24 ENCOUNTER — Encounter (HOSPITAL_COMMUNITY): Payer: Self-pay

## 2017-11-24 DIAGNOSIS — Z01812 Encounter for preprocedural laboratory examination: Secondary | ICD-10-CM | POA: Insufficient documentation

## 2017-11-24 HISTORY — DX: Myoneural disorder, unspecified: G70.9

## 2017-11-24 HISTORY — DX: Thyrotoxicosis, unspecified without thyrotoxic crisis or storm: E05.90

## 2017-11-24 HISTORY — DX: Headache, unspecified: R51.9

## 2017-11-24 HISTORY — DX: Headache: R51

## 2017-11-24 LAB — CBC
HCT: 40.2 % (ref 36.0–46.0)
HEMOGLOBIN: 13.2 g/dL (ref 12.0–15.0)
MCH: 28.2 pg (ref 26.0–34.0)
MCHC: 32.8 g/dL (ref 30.0–36.0)
MCV: 85.9 fL (ref 78.0–100.0)
Platelets: 406 10*3/uL — ABNORMAL HIGH (ref 150–400)
RBC: 4.68 MIL/uL (ref 3.87–5.11)
RDW: 14.3 % (ref 11.5–15.5)
WBC: 7.8 10*3/uL (ref 4.0–10.5)

## 2017-11-24 LAB — TYPE AND SCREEN
ABO/RH(D): O POS
ANTIBODY SCREEN: NEGATIVE

## 2017-11-24 LAB — ABO/RH: ABO/RH(D): O POS

## 2017-11-26 ENCOUNTER — Other Ambulatory Visit (HOSPITAL_COMMUNITY): Payer: 59

## 2017-12-01 ENCOUNTER — Inpatient Hospital Stay (HOSPITAL_COMMUNITY): Payer: 59 | Admitting: Certified Registered Nurse Anesthetist

## 2017-12-01 ENCOUNTER — Encounter (HOSPITAL_COMMUNITY)
Admission: RE | Disposition: A | Discharge status: Home / Self Care | Payer: Self-pay | Source: Home / Self Care | Attending: Obstetrics and Gynecology

## 2017-12-01 ENCOUNTER — Inpatient Hospital Stay (HOSPITAL_COMMUNITY)
Admission: RE | Admit: 2017-12-01 | Discharge: 2017-12-03 | DRG: 743 | Disposition: A | Discharge status: Home / Self Care | Payer: 59 | Attending: Obstetrics and Gynecology | Admitting: Obstetrics and Gynecology

## 2017-12-01 ENCOUNTER — Other Ambulatory Visit: Payer: Self-pay

## 2017-12-01 ENCOUNTER — Encounter (HOSPITAL_COMMUNITY): Payer: Self-pay | Admitting: Certified Registered Nurse Anesthetist

## 2017-12-01 DIAGNOSIS — Z9889 Other specified postprocedural states: Secondary | ICD-10-CM

## 2017-12-01 DIAGNOSIS — F17210 Nicotine dependence, cigarettes, uncomplicated: Secondary | ICD-10-CM | POA: Diagnosis present

## 2017-12-01 DIAGNOSIS — Z6839 Body mass index (BMI) 39.0-39.9, adult: Secondary | ICD-10-CM | POA: Diagnosis not present

## 2017-12-01 DIAGNOSIS — D251 Intramural leiomyoma of uterus: Principal | ICD-10-CM | POA: Diagnosis present

## 2017-12-01 DIAGNOSIS — Z975 Presence of (intrauterine) contraceptive device: Secondary | ICD-10-CM

## 2017-12-01 DIAGNOSIS — D259 Leiomyoma of uterus, unspecified: Secondary | ICD-10-CM | POA: Diagnosis not present

## 2017-12-01 HISTORY — DX: Bronchitis, not specified as acute or chronic: J40

## 2017-12-01 HISTORY — PX: MYOMECTOMY: SHX85

## 2017-12-01 HISTORY — DX: Prediabetes: R73.03

## 2017-12-01 HISTORY — DX: Dyspnea, unspecified: R06.00

## 2017-12-01 LAB — PREGNANCY, URINE: Preg Test, Ur: NEGATIVE

## 2017-12-01 SURGERY — MYOMECTOMY, ABDOMINAL APPROACH
Anesthesia: General | Site: Abdomen

## 2017-12-01 MED ORDER — GLYCOPYRROLATE 0.2 MG/ML IJ SOLN
INTRAMUSCULAR | Status: DC | PRN
  Administered 2017-12-01: 0.2 mg via INTRAVENOUS

## 2017-12-01 MED ORDER — LIDOCAINE HCL (PF) 1 % IJ SOLN
INTRAMUSCULAR | Status: AC
  Filled 2017-12-01: qty 5

## 2017-12-01 MED ORDER — PHENYLEPHRINE 40 MCG/ML (10ML) SYRINGE FOR IV PUSH (FOR BLOOD PRESSURE SUPPORT)
PREFILLED_SYRINGE | INTRAVENOUS | Status: DC | PRN
  Administered 2017-12-01 (×2): 80 ug via INTRAVENOUS

## 2017-12-01 MED ORDER — KETOROLAC TROMETHAMINE 30 MG/ML IJ SOLN: 30.0000 mg | Freq: Four times a day (QID) | INTRAMUSCULAR | Status: DC

## 2017-12-01 MED ORDER — ONDANSETRON HCL 4 MG/2ML IJ SOLN
INTRAMUSCULAR | Status: DC | PRN
  Administered 2017-12-01: 4 mg via INTRAVENOUS

## 2017-12-01 MED ORDER — OXYCODONE-ACETAMINOPHEN 5-325 MG PO TABS
2.0000 | ORAL_TABLET | ORAL | Status: DC | PRN
  Administered 2017-12-02: 2 via ORAL
  Filled 2017-12-01: qty 2

## 2017-12-01 MED ORDER — HYDROMORPHONE HCL 1 MG/ML IJ SOLN
INTRAMUSCULAR | Status: AC
  Filled 2017-12-01: qty 1

## 2017-12-01 MED ORDER — HYDROMORPHONE HCL 1 MG/ML IJ SOLN
INTRAMUSCULAR | Status: AC
  Filled 2017-12-01: qty 0.5

## 2017-12-01 MED ORDER — LIDOCAINE HCL (CARDIAC) PF 100 MG/5ML IV SOSY
PREFILLED_SYRINGE | INTRAVENOUS | Status: AC
  Filled 2017-12-01: qty 5

## 2017-12-01 MED ORDER — LACTATED RINGERS IV SOLN
INTRAVENOUS | Status: DC
  Administered 2017-12-01: 125 mL/h via INTRAVENOUS
  Administered 2017-12-01: 13:00:00 via INTRAVENOUS
  Administered 2017-12-02: 125 mL/h via INTRAVENOUS

## 2017-12-01 MED ORDER — MIDAZOLAM HCL 2 MG/2ML IJ SOLN
INTRAMUSCULAR | Status: DC | PRN
  Administered 2017-12-01: 2 mg via INTRAVENOUS

## 2017-12-01 MED ORDER — SUGAMMADEX SODIUM 200 MG/2ML IV SOLN
INTRAVENOUS | Status: DC | PRN
  Administered 2017-12-01: 200 mg via INTRAVENOUS

## 2017-12-01 MED ORDER — DEXAMETHASONE SODIUM PHOSPHATE 10 MG/ML IJ SOLN
INTRAMUSCULAR | Status: DC | PRN
  Administered 2017-12-01: 4 mg via INTRAVENOUS

## 2017-12-01 MED ORDER — SCOPOLAMINE 1 MG/3DAYS TD PT72
MEDICATED_PATCH | TRANSDERMAL | Status: AC
  Administered 2017-12-01: 1.5 mg via TRANSDERMAL
  Filled 2017-12-01: qty 1

## 2017-12-01 MED ORDER — SIMETHICONE 80 MG PO CHEW
80.0000 mg | CHEWABLE_TABLET | Freq: Four times a day (QID) | ORAL | Status: DC | PRN
  Filled 2017-12-01: qty 1

## 2017-12-01 MED ORDER — LACTATED RINGERS IV SOLN
INTRAVENOUS | Status: DC
  Administered 2017-12-01: 125 mL/h via INTRAVENOUS
  Administered 2017-12-01: 09:00:00 via INTRAVENOUS

## 2017-12-01 MED ORDER — KETOROLAC TROMETHAMINE 30 MG/ML IJ SOLN
30.0000 mg | Freq: Four times a day (QID) | INTRAMUSCULAR | Status: DC
  Administered 2017-12-01 – 2017-12-02 (×5): 30 mg via INTRAVENOUS
  Filled 2017-12-01 (×5): qty 1

## 2017-12-01 MED ORDER — ONDANSETRON HCL 4 MG/2ML IJ SOLN
INTRAMUSCULAR | Status: AC
  Filled 2017-12-01: qty 2

## 2017-12-01 MED ORDER — FENTANYL CITRATE (PF) 250 MCG/5ML IJ SOLN
INTRAMUSCULAR | Status: AC
  Filled 2017-12-01: qty 5

## 2017-12-01 MED ORDER — PHENYLEPHRINE 40 MCG/ML (10ML) SYRINGE FOR IV PUSH (FOR BLOOD PRESSURE SUPPORT)
PREFILLED_SYRINGE | INTRAVENOUS | Status: AC
  Filled 2017-12-01: qty 10

## 2017-12-01 MED ORDER — GLYCOPYRROLATE 0.2 MG/ML IJ SOLN
INTRAMUSCULAR | Status: AC
  Filled 2017-12-01: qty 1

## 2017-12-01 MED ORDER — EPHEDRINE 5 MG/ML INJ
INTRAVENOUS | Status: AC
  Filled 2017-12-01: qty 10

## 2017-12-01 MED ORDER — HYDROMORPHONE HCL 1 MG/ML IJ SOLN
INTRAMUSCULAR | Status: DC | PRN
  Administered 2017-12-01 (×2): 0.5 mg via INTRAVENOUS

## 2017-12-01 MED ORDER — EPHEDRINE SULFATE 50 MG/ML IJ SOLN
INTRAMUSCULAR | Status: DC | PRN
  Administered 2017-12-01: 10 mg via INTRAVENOUS

## 2017-12-01 MED ORDER — KETOROLAC TROMETHAMINE 30 MG/ML IJ SOLN
INTRAMUSCULAR | Status: AC
  Filled 2017-12-01: qty 1

## 2017-12-01 MED ORDER — ROCURONIUM BROMIDE 100 MG/10ML IV SOLN
INTRAVENOUS | Status: DC | PRN
  Administered 2017-12-01: 50 mg via INTRAVENOUS
  Administered 2017-12-01: 10 mg via INTRAVENOUS

## 2017-12-01 MED ORDER — FLUTICASONE PROPIONATE 50 MCG/ACT NA SUSP
1.0000 | Freq: Every day | NASAL | Status: DC
  Filled 2017-12-01: qty 16

## 2017-12-01 MED ORDER — PROPOFOL 10 MG/ML IV BOLUS
INTRAVENOUS | Status: DC | PRN
  Administered 2017-12-01: 200 mg via INTRAVENOUS

## 2017-12-01 MED ORDER — HYDROMORPHONE 1 MG/ML IV SOLN
INTRAVENOUS | Status: DC
  Administered 2017-12-01: 1.2 mg via INTRAVENOUS
  Administered 2017-12-01: 0.3 mg via INTRAVENOUS
  Administered 2017-12-01: 30 mg via INTRAVENOUS
  Administered 2017-12-02: 0.9 mg via INTRAVENOUS
  Administered 2017-12-02: 0.6 mg via INTRAVENOUS
  Administered 2017-12-02: 0.3 mg via INTRAVENOUS
  Filled 2017-12-01: qty 30

## 2017-12-01 MED ORDER — ALUM & MAG HYDROXIDE-SIMETH 200-200-20 MG/5ML PO SUSP
30.0000 mL | ORAL | Status: DC | PRN
  Filled 2017-12-01: qty 30

## 2017-12-01 MED ORDER — NALOXONE HCL 0.4 MG/ML IJ SOLN: 0.4000 mg | INTRAMUSCULAR | Status: DC | PRN

## 2017-12-01 MED ORDER — PROPOFOL 10 MG/ML IV BOLUS
INTRAVENOUS | Status: AC
  Filled 2017-12-01: qty 20

## 2017-12-01 MED ORDER — HYDROMORPHONE HCL 1 MG/ML IJ SOLN
0.2500 mg | INTRAMUSCULAR | Status: DC | PRN
  Administered 2017-12-01 (×3): 0.5 mg via INTRAVENOUS

## 2017-12-01 MED ORDER — IBUPROFEN 800 MG PO TABS
800.0000 mg | ORAL_TABLET | Freq: Three times a day (TID) | ORAL | Status: DC | PRN
  Administered 2017-12-02 – 2017-12-03 (×2): 800 mg via ORAL
  Filled 2017-12-01 (×2): qty 1

## 2017-12-01 MED ORDER — OXYCODONE-ACETAMINOPHEN 5-325 MG PO TABS: 1.0000 | ORAL_TABLET | ORAL | Status: DC | PRN

## 2017-12-01 MED ORDER — SODIUM CHLORIDE 0.9% FLUSH: 9.0000 mL | INTRAVENOUS | Status: DC | PRN

## 2017-12-01 MED ORDER — SODIUM CHLORIDE 0.9 % IV SOLN
INTRAVENOUS | Status: AC
  Filled 2017-12-01: qty 2

## 2017-12-01 MED ORDER — METOCLOPRAMIDE HCL 5 MG/ML IJ SOLN: 10.0000 mg | Freq: Once | INTRAMUSCULAR | Status: DC | PRN

## 2017-12-01 MED ORDER — METHIMAZOLE 5 MG PO TABS
5.0000 mg | ORAL_TABLET | ORAL | Status: DC
  Filled 2017-12-01: qty 1

## 2017-12-01 MED ORDER — ROCURONIUM BROMIDE 100 MG/10ML IV SOLN
INTRAVENOUS | Status: AC
  Filled 2017-12-01: qty 1

## 2017-12-01 MED ORDER — SCOPOLAMINE 1 MG/3DAYS TD PT72
1.0000 | MEDICATED_PATCH | Freq: Once | TRANSDERMAL | Status: DC
  Administered 2017-12-01: 1.5 mg via TRANSDERMAL

## 2017-12-01 MED ORDER — DEXAMETHASONE SODIUM PHOSPHATE 4 MG/ML IJ SOLN
INTRAMUSCULAR | Status: AC
  Filled 2017-12-01: qty 1

## 2017-12-01 MED ORDER — HYDROMORPHONE HCL 1 MG/ML IJ SOLN
INTRAMUSCULAR | Status: AC
  Administered 2017-12-01: 0.5 mg via INTRAVENOUS
  Filled 2017-12-01: qty 1

## 2017-12-01 MED ORDER — SODIUM CHLORIDE 0.9 % IJ SOLN
INTRAMUSCULAR | Status: AC
  Filled 2017-12-01: qty 100

## 2017-12-01 MED ORDER — KETOROLAC TROMETHAMINE 30 MG/ML IJ SOLN
INTRAMUSCULAR | Status: DC | PRN
  Administered 2017-12-01: 30 mg via INTRAVENOUS

## 2017-12-01 MED ORDER — MEPERIDINE HCL 25 MG/ML IJ SOLN: 6.2500 mg | INTRAMUSCULAR | Status: DC | PRN

## 2017-12-01 MED ORDER — SODIUM CHLORIDE 0.9 % IV SOLN
2.0000 g | INTRAVENOUS | Status: AC
  Administered 2017-12-01: 2 g via INTRAVENOUS

## 2017-12-01 MED ORDER — LIDOCAINE HCL (CARDIAC) PF 100 MG/5ML IV SOSY
PREFILLED_SYRINGE | INTRAVENOUS | Status: DC | PRN
  Administered 2017-12-01: 75 mg via INTRAVENOUS

## 2017-12-01 MED ORDER — SUGAMMADEX SODIUM 200 MG/2ML IV SOLN
INTRAVENOUS | Status: AC
  Filled 2017-12-01: qty 2

## 2017-12-01 MED ORDER — METHIMAZOLE 5 MG PO TABS
5.0000 mg | ORAL_TABLET | ORAL | Status: DC
  Administered 2017-12-03: 5 mg via ORAL
  Filled 2017-12-01: qty 1

## 2017-12-01 MED ORDER — VASOPRESSIN 20 UNIT/ML IV SOLN
INTRAVENOUS | Status: AC
  Filled 2017-12-01: qty 1

## 2017-12-01 MED ORDER — DIPHENHYDRAMINE HCL 50 MG/ML IJ SOLN: 12.5000 mg | Freq: Four times a day (QID) | INTRAMUSCULAR | Status: DC | PRN

## 2017-12-01 MED ORDER — MIDAZOLAM HCL 2 MG/2ML IJ SOLN
INTRAMUSCULAR | Status: AC
  Filled 2017-12-01: qty 2

## 2017-12-01 MED ORDER — DIPHENHYDRAMINE HCL 12.5 MG/5ML PO ELIX: 12.5000 mg | ORAL_SOLUTION | Freq: Four times a day (QID) | ORAL | Status: DC | PRN

## 2017-12-01 MED ORDER — ONDANSETRON HCL 4 MG/2ML IJ SOLN: 4.0000 mg | Freq: Four times a day (QID) | INTRAMUSCULAR | Status: DC | PRN

## 2017-12-01 MED ORDER — SENNA 8.6 MG PO TABS
1.0000 | ORAL_TABLET | Freq: Two times a day (BID) | ORAL | Status: DC
  Administered 2017-12-01 – 2017-12-03 (×4): 8.6 mg via ORAL
  Filled 2017-12-01 (×5): qty 1

## 2017-12-01 MED ORDER — VASOPRESSIN 20 UNIT/ML IV SOLN
INTRAVENOUS | Status: DC | PRN
  Administered 2017-12-01: 16 mL via INTRAMUSCULAR

## 2017-12-01 MED ORDER — FENTANYL CITRATE (PF) 100 MCG/2ML IJ SOLN
INTRAMUSCULAR | Status: DC | PRN
  Administered 2017-12-01 (×3): 50 ug via INTRAVENOUS
  Administered 2017-12-01: 100 ug via INTRAVENOUS

## 2017-12-01 MED ORDER — ZOLPIDEM TARTRATE 5 MG PO TABS: 5.0000 mg | ORAL_TABLET | Freq: Every evening | ORAL | Status: DC | PRN

## 2017-12-01 SURGICAL SUPPLY — 46 items
APL SKNCLS STERI-STRIP NONHPOA (GAUZE/BANDAGES/DRESSINGS) ×1
BARRIER ADHS 3X4 INTERCEED (GAUZE/BANDAGES/DRESSINGS) ×1 IMPLANT
BENZOIN TINCTURE PRP APPL 2/3 (GAUZE/BANDAGES/DRESSINGS) ×1 IMPLANT
BRR ADH 4X3 ABS CNTRL BYND (GAUZE/BANDAGES/DRESSINGS) ×1
CANISTER SUCT 3000ML PPV (MISCELLANEOUS) ×2 IMPLANT
CONT PATH 16OZ SNAP LID 3702 (MISCELLANEOUS) ×2 IMPLANT
DECANTER SPIKE VIAL GLASS SM (MISCELLANEOUS) ×2 IMPLANT
DRAPE CESAREAN BIRTH W POUCH (DRAPES) ×2 IMPLANT
DRAPE SHEET LG 3/4 BI-LAMINATE (DRAPES) ×2 IMPLANT
DRSG OPSITE POSTOP 4X10 (GAUZE/BANDAGES/DRESSINGS) ×1 IMPLANT
GAUZE 4X4 16PLY RFD (DISPOSABLE) ×2 IMPLANT
GLOVE BIOGEL M 6.5 STRL (GLOVE) ×4 IMPLANT
GLOVE BIOGEL PI IND STRL 6.5 (GLOVE) ×1 IMPLANT
GLOVE BIOGEL PI IND STRL 7.0 (GLOVE) ×1 IMPLANT
GLOVE BIOGEL PI INDICATOR 6.5 (GLOVE) ×1
GLOVE BIOGEL PI INDICATOR 7.0 (GLOVE) ×1
GOWN STRL REUS W/TWL LRG LVL3 (GOWN DISPOSABLE) ×4 IMPLANT
HEMOSTAT ARISTA ABSORB 3G PWDR (MISCELLANEOUS) ×1 IMPLANT
NEEDLE HYPO 22GX1.5 SAFETY (NEEDLE) ×2 IMPLANT
NS IRRIG 1000ML POUR BTL (IV SOLUTION) ×2 IMPLANT
PACK ABDOMINAL GYN (CUSTOM PROCEDURE TRAY) ×2 IMPLANT
PAD OB MATERNITY 4.3X12.25 (PERSONAL CARE ITEMS) ×2 IMPLANT
PENCIL SMOKE EVAC W/HOLSTER (ELECTROSURGICAL) ×2 IMPLANT
PROTECTOR NERVE ULNAR (MISCELLANEOUS) ×2 IMPLANT
RETRACTOR WND ALEXIS 25 LRG (MISCELLANEOUS) IMPLANT
RTRCTR WOUND ALEXIS 25CM LRG (MISCELLANEOUS) ×2
STAPLER VISISTAT 35W (STAPLE) ×1 IMPLANT
STRIP CLOSURE SKIN 1/2X4 (GAUZE/BANDAGES/DRESSINGS) ×1 IMPLANT
SUT MNCRL 0 VIOLET 6X18 (SUTURE) IMPLANT
SUT MNCRL+ AB 3-0 CT1 36 (SUTURE) IMPLANT
SUT MON AB 2-0 CT1 27 (SUTURE) ×2 IMPLANT
SUT MONOCRYL 0 6X18 (SUTURE)
SUT MONOCRYL AB 3-0 CT1 36IN (SUTURE)
SUT PDS AB 0 CT 36 (SUTURE) ×2 IMPLANT
SUT PDS AB 0 CTX 60 (SUTURE) IMPLANT
SUT PLAIN 2 0 XLH (SUTURE) ×1 IMPLANT
SUT VIC AB 0 CT1 18XCR BRD8 (SUTURE) ×4 IMPLANT
SUT VIC AB 0 CT1 8-18 (SUTURE) ×4
SUT VIC AB 0 CTX 36 (SUTURE) ×2
SUT VIC AB 0 CTX36XBRD ANBCTRL (SUTURE) IMPLANT
SUT VIC AB 4-0 KS 27 (SUTURE) ×1 IMPLANT
SUT VIC AB 4-0 SH 27 (SUTURE) ×4
SUT VIC AB 4-0 SH 27XANBCTRL (SUTURE) IMPLANT
SYR CONTROL 10ML LL (SYRINGE) ×2 IMPLANT
TOWEL OR 17X24 6PK STRL BLUE (TOWEL DISPOSABLE) ×4 IMPLANT
TRAY FOLEY W/BAG SLVR 14FR (SET/KITS/TRAYS/PACK) ×2 IMPLANT

## 2017-12-01 NOTE — H&P (Signed)
Date of Initial H&P: 11/16/2017  History reviewed, patient examined, no change in status, stable for surgery.

## 2017-12-01 NOTE — Transfer of Care (Signed)
Immediate Anesthesia Transfer of Care Note  Patient: [name] [name]  Procedure(s) Performed: MYOMECTOMY (N/A Abdomen)  Patient Location: PACU  Anesthesia Type:General  Level of Consciousness: awake, alert  and oriented  Airway & Oxygen Therapy: Patient Spontanous Breathing and Patient connected to nasal cannula oxygen  Post-op Assessment: Report given to RN and Post -op Vital signs reviewed and stable  Post vital signs: Reviewed and stable  Last Vitals:  Vitals Value Taken Time  BP 132/80 12/01/2017 11:10 AM  Temp    Pulse 97 12/01/2017 11:12 AM  Resp 12 12/01/2017 11:12 AM  SpO2 97 % 12/01/2017 11:12 AM  Vitals shown include unvalidated device data.  Last Pain:  Vitals:   12/01/17 0729  TempSrc: Oral  PainSc: 0-No pain      Patients Stated Pain Goal: 4 (16/55/37 4827)  Complications: No apparent anesthesia complications

## 2017-12-01 NOTE — Anesthesia Preprocedure Evaluation (Signed)
Anesthesia Evaluation    Airway Mallampati: III  TM Distance: >3 FB Neck ROM: Full    Dental no notable dental hx. (+) Teeth Intact   Pulmonary shortness of breath and with exertion, former smoker,    Pulmonary exam normal breath sounds clear to auscultation       Cardiovascular negative cardio ROS Normal cardiovascular exam Rhythm:Regular Rate:Normal     Neuro/Psych  Headaches,  Neuromuscular disease negative psych ROS   GI/Hepatic negative GI ROS, Neg liver ROS,   Endo/Other  Hyperthyroidism Morbid obesity  Renal/GU negative Renal ROS  negative genitourinary   Musculoskeletal   Abdominal (+) + obese,   Peds  Hematology negative hematology ROS (+)   Anesthesia Other Findings   Reproductive/Obstetrics Uterine fibroids                             Anesthesia Physical Anesthesia Plan  ASA: III  Anesthesia Plan: General   Post-op Pain Management:    Induction: Intravenous  PONV Risk Score and Plan: 4 or greater and Scopolamine patch - Pre-op, Midazolam, Dexamethasone, Ondansetron and Treatment may vary due to age or medical condition  Airway Management Planned: Oral ETT  Additional Equipment:   Intra-op Plan:   Post-operative Plan: Extubation in OR  Informed Consent: I have reviewed the patients History and Physical, chart, labs and discussed the procedure including the risks, benefits and alternatives for the proposed anesthesia with the patient or authorized representative who has indicated his/her understanding and acceptance.   Dental advisory given  Plan Discussed with: CRNA and Surgeon  Anesthesia Plan Comments:         Anesthesia Quick Evaluation

## 2017-12-01 NOTE — Op Note (Signed)
12/01/2017  8:11 PM  PATIENT:  [name] [name]  32 y.o. female  PRE-OPERATIVE DIAGNOSIS:  D25.1 Intramural uterine fibroid  POST-OPERATIVE DIAGNOSIS:  Intramural uterine fibroid  PROCEDURE:  Procedure(s): MYOMECTOMY (N/A)  SURGEON:  Surgeon(s) and Role:    * Christophe Louis, MD - Primary    * Janyth Pupa, DO - Assisting  PHYSICIAN ASSISTANT:   ASSISTANTS: Dr. Janyth Pupa assisted due to the complexity of the surgery.    ANESTHESIA:   general  EBL:  50 mL   BLOOD ADMINISTERED:none  DRAINS: Urinary Catheter (Foley)   LOCAL MEDICATIONS USED:  NONE  SPECIMEN:  Source of Specimen:  uterine fibroid  DISPOSITION OF SPECIMEN:  PATHOLOGY  COUNTS:  YES  TOURNIQUET:  * No tourniquets in log *  DICTATION: .Dragon Dictation  PLAN OF CARE: Admit to inpatient   PATIENT DISPOSITION:  PACU - hemodynamically stable.   Delay start of Pharmacological VTE agent (>24 hrs) due to surgical blood loss or risk of bleeding: not applicable  Findings: 8 cm fundal fibroid. Large posterior fibroid approximately 12 cm Normal fallopian tubes and ovaries.   Procedure Details  The patient was seen in the Holding Room. The risks, benefits, complications, treatment options, and expected outcomes were discussed with the patient.  The patient concurred with the proposed plan, giving informed consent.  The site of surgery properly noted/marked. The patient was taken to Operating Room # 7, identified as [name] [name] and the procedure verified as abdominal myomectomy. . A Time Out was held and the above information confirmed.  After induction of anesthesia, the patient was draped and prepped in the usual sterile manner. Pt was placed in supine position after anesthesia and draped and prepped in the usual sterile manner. Foley catheter was placed.  A pfannenstiel incision was made and carried through the subcutaneous tissue to the fascia. Fascial incision was made and extended Laterally . The rectus  muscles were separated. The peritoneum was identified and entered. Peritoneal incision was extended longitudinally. An Alexis retractor was placed and the bowel was packed away with moist laparotomy sponges.   The above findings were noted.A transverse incision was made at the fundus of the uterus . The 8 cm fundal fibroid was removed via sharp and blunt dissection. The myometrium was re- approximated with interrupted suture of 0 vicryl . The serosa was re approximated with 3-0 Monocryl. Multiple attempts were made to elevate the posterior fibroid out of the pelvis. This was unsuccessful. I placed a call to the patients husband [name] [name]. He was informed that in order to remove the posterior fibroid she would most likely end up with a hysterectomy.He reported that [name] [name] would not want a hysterectomy and felt it best to leave the posterior fibroid.  The pelvis was copiously irrigated.  There was bleeding from the posterior uterus  At the junction of the posterior fibroid caused by attempts to lift the fibroid out of the pelvis. Hemostasis was obtained with a figure 8 suture of 0 vicryl. Hemostasis was noted.    The retractor and laparotomy sponges were removed. The fascia was re approximated with 0 pds. The skin was re approximated with 4-0 vicryl. Sponge, lap and needle counts were correct times two. The patient was awakened from anesthesia and taken to the recovery room in stable condition.

## 2017-12-01 NOTE — Anesthesia Procedure Notes (Signed)
Procedure Name: Intubation Date/Time: 12/01/2017 8:48 AM Performed by: Bufford Spikes, CRNA Pre-anesthesia Checklist: Patient identified, Emergency Drugs available, Suction available and Patient being monitored Patient Re-evaluated:Patient Re-evaluated prior to induction Oxygen Delivery Method: Circle system utilized Preoxygenation: Pre-oxygenation with 100% oxygen Induction Type: IV induction Ventilation: Mask ventilation without difficulty Laryngoscope Size: Miller and 2 Grade View: Grade I Tube type: Oral Tube size: 7.0 mm Number of attempts: 1 Airway Equipment and Method: Stylet and Oral airway Placement Confirmation: ETT inserted through vocal cords under direct vision,  positive ETCO2 and breath sounds checked- equal and bilateral Secured at: 21 cm Tube secured with: Tape Dental Injury: Teeth and Oropharynx as per pre-operative assessment

## 2017-12-01 NOTE — Anesthesia Postprocedure Evaluation (Signed)
Anesthesia Post Note  Patient: [name] [name]  Procedure(s) Performed: MYOMECTOMY (N/A Abdomen)     Patient location during evaluation: PACU Anesthesia Type: General Level of consciousness: awake and alert and oriented Pain management: pain level controlled Vital Signs Assessment: post-procedure vital signs reviewed and stable Respiratory status: spontaneous breathing, nonlabored ventilation and respiratory function stable Cardiovascular status: blood pressure returned to baseline and stable Postop Assessment: no apparent nausea or vomiting Anesthetic complications: no    Last Vitals:  Vitals:   12/01/17 1145 12/01/17 1200  BP: 124/79   Pulse: 76 78  Resp: 15 15  Temp:    SpO2: 100% 99%    Last Pain:  Vitals:   12/01/17 1145  TempSrc:   PainSc: Asleep   Pain Goal: Patients Stated Pain Goal: 4 (12/01/17 1130)               Arshi Duarte A.

## 2017-12-02 ENCOUNTER — Encounter (HOSPITAL_COMMUNITY): Payer: Self-pay | Admitting: Obstetrics and Gynecology

## 2017-12-02 LAB — CBC
HEMATOCRIT: 32.4 % — AB (ref 36.0–46.0)
Hemoglobin: 10.8 g/dL — ABNORMAL LOW (ref 12.0–15.0)
MCH: 28.3 pg (ref 26.0–34.0)
MCHC: 33.3 g/dL (ref 30.0–36.0)
MCV: 84.8 fL (ref 78.0–100.0)
Platelets: 301 10*3/uL (ref 150–400)
RBC: 3.82 MIL/uL — ABNORMAL LOW (ref 3.87–5.11)
RDW: 14.4 % (ref 11.5–15.5)
WBC: 10.6 10*3/uL — ABNORMAL HIGH (ref 4.0–10.5)

## 2017-12-02 MED ORDER — IBUPROFEN 800 MG PO TABS: 800.0000 mg | ORAL_TABLET | Freq: Three times a day (TID) | ORAL | 0 refills | Status: DC | PRN

## 2017-12-02 MED ORDER — OXYCODONE-ACETAMINOPHEN 5-325 MG PO TABS: 1.0000 | ORAL_TABLET | ORAL | 0 refills | Status: DC | PRN

## 2017-12-02 NOTE — Progress Notes (Signed)
1 Day Post-Op Procedure(s) (LRB): MYOMECTOMY (N/A)  Subjective: Patient reports incisional pain and tolerating PO.  No flatus yet  Objective: I have reviewed patient's vital signs, intake and output, medications and labs.  General: alert, cooperative and no distress GI: soft appropriately tender nondistended +BS in all 4 quadrants  Extremities: extremities normal, atraumatic, no cyanosis or edema Incision- bandage stained with blood incision is intact  Assessment: s/p Procedure(s): MYOMECTOMY (N/A): stable  Plan: Advance diet Encourage ambulation Discontinue IV fluids remove foley   Awaiting return of bowel and bladder function D/c home tomorrow if return of bowel and bladder function.   LOS: 1 day    Deyton Ellenbecker J. 12/02/2017, 6:56 PM

## 2017-12-03 NOTE — Progress Notes (Signed)
2 Days Post-Op Procedure(s) (LRB): MYOMECTOMY (N/A)  Subjective: Patient reports incisional pain and tolerating PO.  +Flatus  Objective: I have reviewed patient's vital signs, intake and output, medications and labs.  General: alert, cooperative and no distress GI: soft appropriately tender nondistended +BS in all 4 quadrants  Extremities: extremities normal, atraumatic, no cyanosis or edema Incision- bandage stained with blood incision is intact   Assessment: s/p Procedure(s): MYOMECTOMY (N/A): stable  Plan: Doing well. Discharge home. F/u with Dr. Landry Mellow in 2 weeks.   LOS: 2 days    Thurnell Lose 12/03/2017, 8:13 AM

## 2017-12-03 NOTE — Plan of Care (Deleted)
progressing 

## 2017-12-05 ENCOUNTER — Other Ambulatory Visit: Payer: Self-pay | Admitting: Endocrinology

## 2017-12-22 NOTE — Discharge Summary (Signed)
Physician Discharge Summary  Patient ID: [name] [name] MRN: [id number] DOB/AGE: 1985-11-11 32 y.o.  Admit date: 12/01/2017 Discharge date: 12/22/2017  Admission Diagnoses: Fibroids  Discharge Diagnoses:  Active Problems:   S/P myomectomy   Discharged Condition: good  Hospital Course: Uncomplicated post op course.  Consults: None  Significant Diagnostic Studies: None   CBC    Component Value Date/Time   WBC 10.6 (H) 12/02/2017 0604   RBC 3.82 (L) 12/02/2017 0604   HGB 10.8 (L) 12/02/2017 0604   HCT 32.4 (L) 12/02/2017 0604   PLT 301 12/02/2017 0604   MCV 84.8 12/02/2017 0604   MCH 28.3 12/02/2017 0604   MCHC 33.3 12/02/2017 0604   RDW 14.4 12/02/2017 0604   Treatments: surgery: as above  Discharge Exam: Blood pressure 101/71, pulse 67, temperature 98.1 F (36.7 C), temperature source Oral, resp. rate 16, height 5\' 5"  (1.651 m), weight 109.2 kg, SpO2 99 %.  See progress note.  Disposition:   Discharge Instructions     Remove dressing in 72 hours   Complete by:  As directed    Call MD for:  persistant nausea and vomiting   Complete by:  As directed    Call MD for:  redness, tenderness, or signs of infection (pain, swelling, redness, odor or green/yellow discharge around incision site)   Complete by:  As directed    Call MD for:  severe uncontrolled pain   Complete by:  As directed    Call MD for:  temperature >100.4   Complete by:  As directed    Diet - low sodium heart healthy   Complete by:  As directed    Driving Restrictions   Complete by:  As directed    Avoid driving for 2 weeks   Increase activity slowly   Complete by:  As directed    Lifting restrictions   Complete by:  As directed    Avoid lifting over 10 lbs   Sexual Activity Restrictions   Complete by:  As directed    Avoid sexual activity     Allergies as of 12/03/2017   No Known Allergies     Medication List    STOP taking these medications   aspirin-acetaminophen-caffeine  250-250-65 MG tablet Commonly known as:  EXCEDRIN MIGRAINE     TAKE these medications   fluticasone 50 MCG/ACT nasal spray Commonly known as:  FLONASE Place 1 spray into both nostrils daily. What changed:    when to take this  reasons to take this   ibuprofen 800 MG tablet Commonly known as:  ADVIL,MOTRIN Take 1 tablet (800 mg total) by mouth every 8 (eight) hours as needed (mild pain).   levonorgestrel 20 MCG/24HR IUD Commonly known as:  MIRENA 1 each by Intrauterine route once.   LUBRICANT EYE DROPS 0.4-0.3 % Soln Generic drug:  Polyethyl Glycol-Propyl Glycol Place 1 drop into both eyes 3 (three) times daily as needed (dry eyes.).   methimazole 5 MG tablet Commonly known as:  TAPAZOLE Take 1 tablet (5 mg total) by mouth 3 (three) times a week.   oxyCODONE-acetaminophen 5-325 MG tablet Commonly known as:  PERCOCET/ROXICET Take 1-2 tablets by mouth every 4 (four) hours as needed for severe pain ((when tolerating fluids)).   triamcinolone cream 0.1 % Commonly known as:  KENALOG Apply 1 application 4 (four) times daily topically. As needed for itching      Follow-up Information    Christophe Louis, MD. Go in 2 week(s).   Specialty:  Obstetrics  and Gynecology Why:  patient already has a postoperative appointment  Contact information: [address] [place] [id number] [phone]           Signed: Thurnell Lose 12/22/2017, 1:08 PM

## 2018-02-16 ENCOUNTER — Ambulatory Visit: Payer: 59 | Admitting: Endocrinology

## 2018-02-16 ENCOUNTER — Encounter: Payer: Self-pay | Admitting: Endocrinology

## 2018-02-16 VITALS — BP 124/82 | HR 71 | Ht 65.0 in | Wt 244.2 lb

## 2018-02-16 DIAGNOSIS — E0590 Thyrotoxicosis, unspecified without thyrotoxic crisis or storm: Secondary | ICD-10-CM | POA: Diagnosis not present

## 2018-02-16 LAB — T4, FREE: Free T4: 0.65 ng/dL (ref 0.60–1.60)

## 2018-02-16 LAB — TSH: TSH: 1.09 u[IU]/mL (ref 0.35–4.50)

## 2018-02-16 NOTE — Patient Instructions (Signed)
blood tests are requested for you today.  We'll let you know about the results.  If ever you have fever while taking methimazole, stop it and call us, even if the reason is obvious, because of the risk of a rare side-effect.   Please come back for a follow-up appointment in 4 months.  Please call sooner if the pregnancy happens.

## 2018-02-16 NOTE — Progress Notes (Signed)
Subjective:    Patient ID: [name] [name], female    DOB: [birth year], 32 y.o.   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  pt states she feels well in general.  She is not considering a pregnancy yet.  She will have IUD removed soon.   Past Medical History:  Diagnosis Date  . Allergic rhinitis   . Bronchitis   . Dyspnea    3 times a week  . Headache   . Hyperthyroidism   . Neuromuscular disorder ([place])    carpel tunnel left hand  . Pre-diabetes   . SVD (spontaneous vaginal delivery) 2004   x 1    Past Surgical History:  Procedure Laterality Date  . DILATION AND CURETTAGE OF UTERUS     termination x 1  . MYOMECTOMY N/A 12/01/2017   Procedure: MYOMECTOMY;  Surgeon: Christophe Louis, MD;  Location: [facility];  Service: Gynecology;  Laterality: N/A;  . WISDOM TOOTH EXTRACTION      Social History   Socioeconomic History  . Marital status: Married    Spouse name: Barbaraann Rondo  . Number of children: 1  . Years of education: Not on file  . Highest education level: Not on file  Occupational History  . Not on file  Social Needs  . Financial resource strain: Not on file  . Food insecurity:    Worry: Not on file    Inability: Not on file  . Transportation needs:    Medical: Not on file    Non-medical: Not on file  Tobacco Use  . Smoking status: Former Smoker    Packs/day: 0.25    Years: 17.00    Pack years: 4.25    Types: Cigars    Last attempt to quit: 10/31/2017    Years since quitting: 0.3  . Smokeless tobacco: Never Used  . Tobacco comment: Not smoked since 10/2017  Substance and Sexual Activity  . Alcohol use: Yes    Comment: Once or twice a month  . Drug use: No  . Sexual activity: Yes    Birth control/protection: IUD    Comment: Mirena IUD    Lifestyle  . Physical activity:    Days per week: Not on file    Minutes per session: Not on file  . Stress: Not on file  Relationships  . Social connections:    Talks on phone: Not on file    Gets together: Not on file    Attends religious service: Not on file    Active member of club or organization: Not on file    Attends meetings of clubs or organizations: Not on file    Relationship status: Not on file  . Intimate partner violence:    Fear of current or ex partner: Not on file    Emotionally abused: Not on file    Physically abused: Not on file    Forced sexual activity: Not on file  Other Topics Concern  . Not on file  Social History Narrative  . Not on file    Current Outpatient Medications on File Prior to Visit  Medication Sig Dispense Refill  . fluticasone (FLONASE) 50 MCG/ACT nasal spray Place 1 spray into both nostrils daily. (Patient taking differently: Place 1 spray into both nostrils daily as needed for allergies. )  16 g 2  . levonorgestrel (MIRENA) 20 MCG/24HR IUD 1 each by Intrauterine route once.    . methimazole (TAPAZOLE) 5 MG tablet Take 1 tablet (5 mg total) by mouth 3 (three) times a week. (Patient taking differently: Take 5 mg by mouth 3 (three) times a week. ) 13 tablet 5  . Polyethyl Glycol-Propyl Glycol (LUBRICANT EYE DROPS) 0.4-0.3 % SOLN Place 1 drop into both eyes 3 (three) times daily as needed (dry eyes.).    Marland Kitchen ibuprofen (ADVIL,MOTRIN) 800 MG tablet Take 1 tablet (800 mg total) by mouth every 8 (eight) hours as needed (mild pain). (Patient not taking: Reported on 02/16/2018) 30 tablet 0  . oxyCODONE-acetaminophen (PERCOCET/ROXICET) 5-325 MG tablet Take 1-2 tablets by mouth every 4 (four) hours as needed for severe pain ((when tolerating fluids)). (Patient not taking: Reported on 02/16/2018) 30 tablet 0  . triamcinolone cream (KENALOG) 0.1 % Apply 1 application 4 (four) times daily topically. As needed for itching (Patient not taking: Reported on  02/16/2018) 80 g 2   No current facility-administered medications on file prior to visit.     No Known Allergies  Family History  Problem Relation Age of Onset  . Thyroid disease Neg Hx     BP 124/82 (BP Location: Left Arm, Patient Position: Sitting, Cuff Size: Normal)   Pulse 71   Ht 5\' 5"  (1.651 m)   Wt 244 lb 3.2 oz (110.8 kg)   SpO2 96%   BMI 40.64 kg/m    Review of Systems Denies fever    Objective:   Physical Exam VITAL SIGNS:  See vs page.   GENERAL: no distress.   NECK: Thyroid is slightly enlarged, with irreg surface.    Lab Results  Component Value Date   TSH 1.09 02/16/2018      Assessment & Plan:  Hyperthyroidism: well-controlled.  Please continue the same medication Fertility: I told pt to let us know if the pregnancy happens.  Patient Instructions  blood tests are requested for you today.  We'll let you know about the results.  If ever you have fever while taking methimazole, stop it and call us, even if the reason is obvious, because of the risk of a rare side-effect.   Please come back for a follow-up appointment in 4 months.  Please call sooner if the pregnancy happens.

## 2018-06-20 ENCOUNTER — Telehealth: Payer: 59 | Admitting: Family

## 2018-06-20 ENCOUNTER — Ambulatory Visit (HOSPITAL_COMMUNITY)
Admission: EM | Admit: 2018-06-20 | Discharge: 2018-06-20 | Disposition: A | Discharge status: Home / Self Care | Payer: 59 | Attending: Family Medicine | Admitting: Family Medicine

## 2018-06-20 ENCOUNTER — Encounter (HOSPITAL_COMMUNITY): Payer: Self-pay | Admitting: Emergency Medicine

## 2018-06-20 ENCOUNTER — Other Ambulatory Visit: Payer: Self-pay

## 2018-06-20 DIAGNOSIS — J4521 Mild intermittent asthma with (acute) exacerbation: Secondary | ICD-10-CM

## 2018-06-20 MED ORDER — ALBUTEROL SULFATE (2.5 MG/3ML) 0.083% IN NEBU: 2.5000 mg | INHALATION_SOLUTION | Freq: Four times a day (QID) | RESPIRATORY_TRACT | 0 refills | Status: DC | PRN

## 2018-06-20 MED ORDER — FLUTICASONE PROPIONATE 50 MCG/ACT NA SUSP: 2.0000 | Freq: Every day | NASAL | 0 refills | Status: AC

## 2018-06-20 MED ORDER — AEROCHAMBER PLUS FLO-VU MEDIUM MISC
1.0000 | Freq: Once | Status: AC
  Administered 2018-06-20: 1

## 2018-06-20 MED ORDER — MONTELUKAST SODIUM 10 MG PO TABS: 10.0000 mg | ORAL_TABLET | Freq: Every day | ORAL | 0 refills | Status: DC

## 2018-06-20 MED ORDER — AEROCHAMBER PLUS FLO-VU LARGE MISC
Status: AC
  Filled 2018-06-20: qty 1

## 2018-06-20 MED ORDER — ALBUTEROL SULFATE HFA 108 (90 BASE) MCG/ACT IN AERS: 1.0000 | INHALATION_SPRAY | Freq: Four times a day (QID) | RESPIRATORY_TRACT | 0 refills | Status: DC | PRN

## 2018-06-20 MED ORDER — PREDNISONE 20 MG PO TABS: 20.0000 mg | ORAL_TABLET | Freq: Every day | ORAL | 0 refills | Status: DC

## 2018-06-20 NOTE — Discharge Instructions (Signed)
Albuterol inhaler/nebulizer as needed every 4-6 hours. Start flonase and montelukast as directed for possible allergies causing symptoms.  As discussed, cannot rule out COVID. Currently, no alarming signs. I would like you to quarantine for 7 days since symptoms onset AND >72 hours without fever, and improved respiratory symptoms. Continue to monitor, you can call COVID hotline [phone]) or use [facility] visit online if symptoms worsens to determine where you should seek care. If experiencing shortness of breath, trouble breathing, call 911 and provide them with your current situation.

## 2018-06-20 NOTE — Progress Notes (Signed)
E Visit for Asthma  Based on what you have shared with me, it looks like you may have a flare up of your asthma.  Asthma is a chronic (ongoing) lung disease which results in airway obstruction, inflammation and hyper-responsiveness.   Asthma symptoms vary from person to person, with common symptoms including nighttime awakening and decreased ability to participate in normal activities as a result of shortness of breath. It is often triggered by changes in weather, changes in the season, changes in air temperature, or inside (home, school, daycare or work) allergens such as animal dander, mold, mildew, woodstoves or cockroaches.   It can also be triggered by hormonal changes, extreme emotion, physical exertion or an upper respiratory tract illness.     It is important to identify the trigger, and then eliminate or avoid the trigger if possible.   If you have been prescribed medications to be taken on a regular basis, it is important to follow the asthma action plan and to follow guidelines to adjust medication in response to increasing symptoms of decreased peak expiratory flow rate  Treatment: I have prescribed: Prednisone 40mg by mouth per day for 5 - 7 days  HOME CARE . Only take medications as instructed by your medical team. . Consider wearing a mask or scarf to improve breathing air temperature have been shown to decrease irritation and decrease exacerbations . Get rest. . Taking a steamy shower or using a humidifier may help nasal congestion sand ease sore throat pain. You can place a towel over your head and breathe in the steam from hot water coming from a faucet. . Using a saline nasal spray works much the same way.  . Cough drops, hare candies and sore throat lozenges may ease your cough.  . Avoid close contacts especially the very you and the elderly . Cover your mouth if  you cough or sneeze . Always remember to wash your hands.    GET HELP RIGHT AWAY IF: . You develop worsening symptoms; breathlessness at rest, drowsy, confused or agitated, unable to speak in full sentences . You have coughing fits . You develop a severe headache or visual changes . You develop shortness of breath, difficulty breathing or start having chest pain . Your symptoms persist after you have completed your treatment plan . If your symptoms do not improve within 10 days  MAKE SURE YOU . Understand these instructions. . Will watch your condition. . Will get help right away if you are not doing well or get worse.   Your e-visit answers were reviewed by a board certified advanced clinical practitioner to complete your personal care plan, Depending upon the condition, your plan could have included both over the counter or prescription medications.  Please review your pharmacy choice. Your safety is important to us. If you have drug allergies check your prescription carefully. You can use MyChart to ask questions about today's visit, request a non-urgent call back, or ask for a work or school excuse for 24 hours related to this e-Visit. If it has been greater than 24 hours you will need to follow up with your provider, or enter a new e-Visit to address those concerns.  You will get an e-mail in the next two days asking about your experience. I hope that your e-visit has been valuable and will speed your recovery. Thank you for using e-visits. 

## 2018-06-20 NOTE — ED Triage Notes (Signed)
Pt states "I had difficulty catching my breath all the time" states she uses an inhaler for when it gets bad but her inhaler is out.

## 2018-06-20 NOTE — ED Provider Notes (Signed)
[place]    CSN: [id number] Arrival date & time: 06/20/18  6010     History   Chief Complaint Chief Complaint  Patient presents with  . Shortness of Breath    HPI [name] [name] is a 33 y.o. female.   33 year old female comes in for shortness of breath and wheezing.  States this has been going on for few weeks, with improvement using albuterol until the past 2 to 3 days.  She has also had cough, nasal congestion, sneezing.  Denies fever, chills, night sweats.  States she has been using albuterol daily to twice daily as she has never been diagnosed with asthma.  She has albuterol inhaler from a family member.  No travels, sick contact.  Former smoker.  Has not taken any other medicines for the symptoms. Works at a clinic.      Past Medical History:  Diagnosis Date  . Allergic rhinitis   . Bronchitis   . Dyspnea    3 times a week  . Headache   . Hyperthyroidism   . Neuromuscular disorder ([place])    carpel tunnel left hand  . Pre-diabetes   . SVD (spontaneous vaginal delivery) 2004   x 1    Patient Active Problem List   Diagnosis Date Noted  . S/P myomectomy 12/01/2017  . Dyspnea 03/29/2017  . Allergic rhinitis   . Hyperthyroidism 01/29/2017    Past Surgical History:  Procedure Laterality Date  . DILATION AND CURETTAGE OF UTERUS     termination x 1  . MYOMECTOMY N/A 12/01/2017   Procedure: MYOMECTOMY;  Surgeon: Christophe Louis, MD;  Location: [facility];  Service: Gynecology;  Laterality: N/A;  . WISDOM TOOTH EXTRACTION      OB History   No obstetric history on file.      Home Medications    Prior to Admission medications   Medication Sig Start Date End Date Taking? Authorizing Provider  methimazole (TAPAZOLE) 5 MG tablet Take 1 tablet (5 mg total) by mouth 3 (three) times a week. Patient taking differently: Take 5 mg by mouth 3 (three) times a week.  11/22/17  Yes Renato Shin, MD  albuterol (PROVENTIL HFA;VENTOLIN HFA) 108 (90 Base) MCG/ACT  inhaler Inhale 1-2 puffs into the lungs every 6 (six) hours as needed for wheezing or shortness of breath. 06/20/18   Tasia Catchings,  V, PA-C  albuterol (PROVENTIL) (2.5 MG/3ML) 0.083% nebulizer solution Take 3 mLs (2.5 mg total) by nebulization every 6 (six) hours as needed for wheezing or shortness of breath. 06/20/18   Tasia Catchings,  V, PA-C  fluticasone (FLONASE) 50 MCG/ACT nasal spray Place 2 sprays into both nostrils daily. 06/20/18   Tasia Catchings,  V, PA-C  ibuprofen (ADVIL,MOTRIN) 800 MG tablet Take 1 tablet (800 mg total) by mouth every 8 (eight) hours as needed (mild pain). Patient not taking: Reported on 02/16/2018 12/02/17   Christophe Louis, MD  levonorgestrel [facility]) 20 MCG/24HR IUD 1 each by Intrauterine route once.    [provider]  montelukast (SINGULAIR) 10 MG tablet Take 1 tablet (10 mg total) by mouth at bedtime. 06/20/18   Tasia Catchings,  V, PA-C  oxyCODONE-acetaminophen (PERCOCET/ROXICET) 5-325 MG tablet Take 1-2 tablets by mouth every 4 (four) hours as needed for severe pain ((when tolerating fluids)). Patient not taking: Reported on 02/16/2018 12/02/17   Christophe Louis, MD  Polyethyl Glycol-Propyl Glycol (LUBRICANT EYE DROPS) 0.4-0.3 % SOLN Place 1 drop into both eyes 3 (three) times daily as needed (dry eyes.).  [provider]  triamcinolone cream (KENALOG) 0.1 % Apply 1 application 4 (four) times daily topically. As needed for itching Patient not taking: Reported on 02/16/2018 01/29/17   Renato Shin, MD    Family History Family History  Problem Relation Age of Onset  . Thyroid disease Neg Hx     Social History Social History   Tobacco Use  . Smoking status: Former Smoker    Packs/day: 0.25    Years: 17.00    Pack years: 4.25    Types: Cigars    Last attempt to quit: 10/31/2017    Years since quitting: 0.6  . Smokeless tobacco: Never Used  . Tobacco comment: Not smoked since 10/2017  Substance Use Topics  . Alcohol use: Yes    Comment: Once or twice a month  . Drug use: No      Allergies   Patient has no known allergies.   Review of Systems Review of Systems  Reason unable to perform ROS: See HPI as above.     Physical Exam Triage Vital Signs ED Triage Vitals [06/20/18 1004]  Enc Vitals Group     BP (!) 101/55     Pulse Rate 79     Resp 18     Temp 98.5 F (36.9 C)     Temp src      SpO2 99 %     Weight      Height      Head Circumference      Peak Flow      Pain Score 0     Pain Loc      Pain Edu?      Excl. in [place]?    No data found.  Updated Vital Signs BP (!) 101/55   Pulse 79   Temp 98.5 F (36.9 C)   Resp 18   SpO2 99%   Physical Exam Constitutional:      General: She is not in acute distress.    Appearance: Normal appearance. She is not ill-appearing, toxic-appearing or diaphoretic.  HENT:     Head: Normocephalic and atraumatic.     Mouth/Throat:     Mouth: Mucous membranes are moist.     Pharynx: Oropharynx is clear. Uvula midline.  Neck:     Musculoskeletal: Normal range of motion and neck supple.  Cardiovascular:     Rate and Rhythm: Normal rate and regular rhythm.     Heart sounds: Normal heart sounds. No murmur. No friction rub. No gallop.   Pulmonary:     Effort: Pulmonary effort is normal. No accessory muscle usage, prolonged expiration, respiratory distress or retractions.     Comments: Patient speaking in full sentences without difficulty. Diffuse inspiratory and expiratory wheezing in the periphery. No rhonchi, rales.  Neurological:     General: No focal deficit present.     Mental Status: She is alert and oriented to person, place, and time.      UC Treatments / Results  Labs (all labs ordered are listed, but only abnormal results are displayed) Labs Reviewed - No data to display  EKG None  Radiology No results found.  Procedures Procedures (including critical care time)  Medications Ordered in UC Medications  AeroChamber Plus Flo-Vu Medium MISC 1 each (1 each Other Given 06/20/18 1058)     Initial Impression / Assessment and Plan / UC Course  I have reviewed the triage vital signs and the nursing notes.  Pertinent labs & imaging results that were available during  my care of the patient were reviewed by me and considered in my medical decision making (see chart for details).    No alarming signs on exam. Discussed possible allergic rhinitis triggering asthma exacerbation. However, cannot rule out COVID as possible cause. Patient has tried otc antihistamines in the past without relief of allergies. Will start singulair and flonase as directed. Albuterol as needed. Patient symptoms has been >7 days, will have patient monitor for > 72 hours of improvement prior to returning to work. Return precautions given. Patient expresses understanding and agrees to plan.  Final Clinical Impressions(s) / UC Diagnoses   Final diagnoses:  Mild intermittent asthma with exacerbation   ED Prescriptions    Medication Sig Dispense Auth. Provider   albuterol (PROVENTIL HFA;VENTOLIN HFA) 108 (90 Base) MCG/ACT inhaler Inhale 1-2 puffs into the lungs every 6 (six) hours as needed for wheezing or shortness of breath. 1 Inhaler ,  V, PA-C   fluticasone (FLONASE) 50 MCG/ACT nasal spray Place 2 sprays into both nostrils daily. 1 g ,  V, PA-C   montelukast (SINGULAIR) 10 MG tablet Take 1 tablet (10 mg total) by mouth at bedtime. 30 tablet ,  V, PA-C   albuterol (PROVENTIL) (2.5 MG/3ML) 0.083% nebulizer solution Take 3 mLs (2.5 mg total) by nebulization every 6 (six) hours as needed for wheezing or shortness of breath. 75 mL Tobin Chad, [place] 06/20/18 1102

## 2018-06-23 ENCOUNTER — Other Ambulatory Visit: Payer: Self-pay

## 2018-06-23 ENCOUNTER — Encounter: Payer: Self-pay | Admitting: Nurse Practitioner

## 2018-06-23 ENCOUNTER — Ambulatory Visit (INDEPENDENT_AMBULATORY_CARE_PROVIDER_SITE_OTHER): Payer: 59 | Admitting: Nurse Practitioner

## 2018-06-23 VITALS — BP 120/80 | HR 76 | Temp 98.3°F | Ht 65.6 in | Wt 240.4 lb

## 2018-06-23 DIAGNOSIS — R062 Wheezing: Secondary | ICD-10-CM | POA: Diagnosis not present

## 2018-06-23 DIAGNOSIS — J209 Acute bronchitis, unspecified: Secondary | ICD-10-CM | POA: Diagnosis not present

## 2018-06-23 NOTE — Progress Notes (Signed)
Subjective:     Patient ID: [name] [name] , female    DOB: 03-28-1985 , 33 y.o.   MRN: [id number]   Chief Complaint  Patient presents with  . Shortness of Breath    HPI  Went to urgent care on Monday for difficulty breathing.  She had been using an albuterol inhaler.  When she was fussing with her daughter she became shore of breath and when laugh too hard has coughing.  Shortness of Breath  This is a new problem. The current episode started in the past 7 days. The problem occurs intermittently. Pertinent negatives include no abdominal pain or chest pain. The patient has no known risk factors for DVT/PE. She has tried nothing (usually takes flonase for her allergies) for the symptoms.     Past Medical History:  Diagnosis Date  . Allergic rhinitis   . Bronchitis   . Dyspnea    3 times a week  . Headache   . Hyperthyroidism   . Neuromuscular disorder ([place])    carpel tunnel left hand  . Pre-diabetes   . SVD (spontaneous vaginal delivery) 2004   x 1     Family History  Problem Relation Age of Onset  . Heart disease Father   . Thyroid disease Neg Hx      Current Outpatient Medications:  .  albuterol (PROVENTIL HFA;VENTOLIN HFA) 108 (90 Base) MCG/ACT inhaler, Inhale 1-2 puffs into the lungs every 6 (six) hours as needed for wheezing or shortness of breath., Disp: 1 Inhaler, Rfl: 0 .  albuterol (PROVENTIL) (2.5 MG/3ML) 0.083% nebulizer solution, Take 3 mLs (2.5 mg total) by nebulization every 6 (six) hours as needed for wheezing or shortness of breath., Disp: 75 mL, Rfl: 0 .  fluticasone (FLONASE) 50 MCG/ACT nasal spray, Place 2 sprays into both nostrils daily., Disp: 1 g, Rfl: 0 .  levonorgestrel (MIRENA) 20 MCG/24HR IUD, 1 each by Intrauterine route once., Disp: , Rfl:  .  montelukast (SINGULAIR) 10 MG tablet, Take 1 tablet (10 mg total) by mouth at bedtime., Disp: 30 tablet, Rfl: 0 .  Polyethyl Glycol-Propyl Glycol (LUBRICANT EYE DROPS) 0.4-0.3 % SOLN, Place 1 drop into  both eyes 3 (three) times daily as needed (dry eyes.)., Disp: , Rfl:  .  ibuprofen (ADVIL,MOTRIN) 800 MG tablet, Take 1 tablet (800 mg total) by mouth every 8 (eight) hours as needed (mild pain). (Patient not taking: Reported on 02/16/2018), Disp: 30 tablet, Rfl: 0 .  methimazole (TAPAZOLE) 5 MG tablet, Take 1 tablet (5 mg total) by mouth 3 (three) times a week. (Patient not taking: Reported on 06/23/2018), Disp: 13 tablet, Rfl: 5 .  oxyCODONE-acetaminophen (PERCOCET/ROXICET) 5-325 MG tablet, Take 1-2 tablets by mouth every 4 (four) hours as needed for severe pain ((when tolerating fluids)). (Patient not taking: Reported on 02/16/2018), Disp: 30 tablet, Rfl: 0 .  triamcinolone cream (KENALOG) 0.1 %, Apply 1 application 4 (four) times daily topically. As needed for itching (Patient not taking: Reported on 02/16/2018), Disp: 80 g, Rfl: 2   No Known Allergies   Review of Systems  Respiratory: Positive for shortness of breath.   Cardiovascular: Negative for chest pain.  Gastrointestinal: Negative for abdominal pain.     Today's Vitals   06/23/18 1203  BP: 120/80  Pulse: 76  Temp: 98.3 F (36.8 C)  TempSrc: Oral  SpO2: 97%  Weight: 240 lb 6.4 oz (109 kg)  Height: 5' 5.6" (1.666 m)   Body mass index is 39.28 kg/m.   Objective:  Physical Exam Vitals signs reviewed.  Constitutional:      Appearance: She is well-developed.  Cardiovascular:     Rate and Rhythm: Normal rate and regular rhythm.  Pulmonary:     Effort: Pulmonary effort is normal. No tachypnea or respiratory distress.     Breath sounds: Examination of the right-lower field reveals wheezing. Examination of the left-lower field reveals wheezing. Wheezing present.  Chest:     Chest wall: No mass.  Musculoskeletal: Normal range of motion.  Skin:    General: Skin is warm and dry.     Capillary Refill: Capillary refill takes less than 2 seconds.  Neurological:     General: No focal deficit present.     Mental Status: She is  alert.  Psychiatric:        Mood and Affect: Mood normal.        Behavior: Behavior normal.         Assessment And Plan:     1. Wheezing  Lower lobes with inspiratory wheezes  2. Acute bronchitis, unspecified organism  She had been coughing since December however now having shortness of breath when speaking and at rest.    She is to take her prednisone that was given earlier in the week, will call and check on her tomorrow.    Minette Brine, FNP    THE PATIENT IS ENCOURAGED TO PRACTICE SOCIAL DISTANCING DUE TO THE COVID-19 PANDEMIC.

## 2018-06-28 ENCOUNTER — Ambulatory Visit: Payer: 59 | Admitting: Endocrinology

## 2018-07-04 ENCOUNTER — Encounter: Payer: Self-pay | Admitting: Endocrinology

## 2018-07-05 ENCOUNTER — Other Ambulatory Visit: Payer: Self-pay

## 2018-07-05 ENCOUNTER — Ambulatory Visit (INDEPENDENT_AMBULATORY_CARE_PROVIDER_SITE_OTHER): Payer: 59 | Admitting: Endocrinology

## 2018-07-05 ENCOUNTER — Other Ambulatory Visit: Payer: Self-pay | Admitting: Endocrinology

## 2018-07-05 DIAGNOSIS — E0590 Thyrotoxicosis, unspecified without thyrotoxic crisis or storm: Secondary | ICD-10-CM

## 2018-07-05 NOTE — Progress Notes (Addendum)
Subjective:    Patient ID: [name] [name], female    DOB: June 06, 1985, 33 y.o.   MRN: [id number]  HPI  telehealth visit today via doxy video visit.  Alternatives to telehealth are presented to this patient, and the patient agrees to the telehealth visit. Pt is advised of the cost of the visit, and agrees to this, also.   Patient is at home, and I am at the office.   Persons attending the telehealth visit: the patient and I.  Pt returns for f/u of hyperthyroidism (dx'ed 2018; she chose tapazole rx; [name] was c/w thyroiditis; she has factors favoring both nodular goiter and Grave's Dz; she chose tapazole rx; spirometry showed moderate obstruction, but no extrapulmonary component).  pt states she feels well in general.  She still has IUD.   Past Medical History:  Diagnosis Date  . Allergic rhinitis   . Bronchitis   . Dyspnea    3 times a week  . Headache   . Hyperthyroidism   . Neuromuscular disorder ([place])    carpel tunnel left hand  . Pre-diabetes   . SVD (spontaneous vaginal delivery) 2004   x 1    Past Surgical History:  Procedure Laterality Date  . DILATION AND CURETTAGE OF UTERUS     termination x 1  . MYOMECTOMY N/A 12/01/2017   Procedure: MYOMECTOMY;  Surgeon: Christophe Louis, MD;  Location: [facility];  Service: Gynecology;  Laterality: N/A;  . WISDOM TOOTH EXTRACTION      Social History   Socioeconomic History  . Marital status: Married    Spouse name: Barbaraann Rondo  . Number of children: 1  . Years of education: Not on file  . Highest education level: Not on file  Occupational History  . Not on file  Social Needs  . Financial resource strain: Not on file  . Food insecurity:    Worry: Not on file    Inability: Not on file  . Transportation needs:    Medical: Not on file    Non-medical: Not on file  Tobacco Use  . Smoking status: Former Smoker    Packs/day: 0.25    Years: 17.00    Pack years: 4.25    Types: Cigars    Last attempt to quit: 10/31/2017    Years since  quitting: 0.6  . Smokeless tobacco: Never Used  . Tobacco comment: Not smoked since 10/2017  Substance and Sexual Activity  . Alcohol use: Yes    Comment: Once or twice a month  . Drug use: No  . Sexual activity: Yes    Birth control/protection: I.U.D.    Comment: Mirena IUD  Lifestyle  . Physical activity:    Days per week: Not on file    Minutes per session: Not on file  . Stress: Not on file  Relationships  . Social connections:    Talks on phone: Not on file    Gets together: Not on file    Attends religious service: Not on file    Active member of club or organization: Not on file    Attends meetings of clubs or organizations: Not on file    Relationship status: Not on file  . Intimate partner violence:    Fear of current or ex partner: Not on file    Emotionally abused: Not on file    Physically abused: Not on file    Forced sexual activity: Not on file  Other Topics Concern  . Not on file  Social  History Narrative  . Not on file    Current Outpatient Medications on File Prior to Visit  Medication Sig Dispense Refill  . albuterol (PROVENTIL HFA;VENTOLIN HFA) 108 (90 Base) MCG/ACT inhaler Inhale 1-2 puffs into the lungs every 6 (six) hours as needed for wheezing or shortness of breath. 1 Inhaler 0  . albuterol (PROVENTIL) (2.5 MG/3ML) 0.083% nebulizer solution Take 3 mLs (2.5 mg total) by nebulization every 6 (six) hours as needed for wheezing or shortness of breath. 75 mL 0  . fluticasone (FLONASE) 50 MCG/ACT nasal spray Place 2 sprays into both nostrils daily. 1 g 0  . ibuprofen (ADVIL,MOTRIN) 800 MG tablet Take 1 tablet (800 mg total) by mouth every 8 (eight) hours as needed (mild pain). 30 tablet 0  . levonorgestrel (MIRENA) 20 MCG/24HR IUD 1 each by Intrauterine route once.    . montelukast (SINGULAIR) 10 MG tablet Take 1 tablet (10 mg total) by mouth at bedtime. 30 tablet 0  . Polyethyl Glycol-Propyl Glycol (LUBRICANT EYE DROPS) 0.4-0.3 % SOLN Place 1 drop into  both eyes 3 (three) times daily as needed (dry eyes.).    Marland Kitchen triamcinolone cream (KENALOG) 0.1 % Apply 1 application 4 (four) times daily topically. As needed for itching 80 g 2   No current facility-administered medications on file prior to visit.     No Known Allergies  Family History  Problem Relation Age of Onset  . Heart disease Father   . Thyroid disease Neg Hx     Review of Systems Denies fever    Objective:   Physical Exam      Assessment & Plan:  Hyperthyroidism: due for recheck Allergic rhinitis: in this setting, she needs to maintain euthyroidism.   Patient Instructions  blood tests are requested for you today.  We'll let you know about the results.  If ever you have fever while taking methimazole, stop it and call us, even if the reason is obvious, because of the risk of a rare side-effect.   Please come back for a follow-up appointment in 4-6 months.  Please call sooner if the pregnancy happens.

## 2018-07-05 NOTE — Patient Instructions (Signed)
blood tests are requested for you today.  We'll let you know about the results.  If ever you have fever while taking methimazole, stop it and call us, even if the reason is obvious, because of the risk of a rare side-effect.   Please come back for a follow-up appointment in 4-6 months.  Please call sooner if the pregnancy happens.

## 2018-07-07 ENCOUNTER — Other Ambulatory Visit: Payer: 59

## 2018-07-08 ENCOUNTER — Other Ambulatory Visit: Payer: 59

## 2018-07-13 ENCOUNTER — Other Ambulatory Visit: Payer: 59

## 2018-07-15 ENCOUNTER — Other Ambulatory Visit (INDEPENDENT_AMBULATORY_CARE_PROVIDER_SITE_OTHER): Payer: 59

## 2018-07-15 ENCOUNTER — Other Ambulatory Visit: Payer: Self-pay

## 2018-07-15 DIAGNOSIS — E0590 Thyrotoxicosis, unspecified without thyrotoxic crisis or storm: Secondary | ICD-10-CM

## 2018-07-15 LAB — T4, FREE: Free T4: 0.85 ng/dL (ref 0.60–1.60)

## 2018-07-15 LAB — TSH: TSH: 1.25 u[IU]/mL (ref 0.35–4.50)

## 2018-08-18 ENCOUNTER — Telehealth: Payer: Self-pay

## 2018-08-18 NOTE — Telephone Encounter (Signed)
LOV 07/05/18. Per Dr. Loanne Drilling, f/u in 4 mo. Called pt to schedule appt. LVM requesting returned call.

## 2018-09-28 ENCOUNTER — Encounter: Payer: 59 | Admitting: Nurse Practitioner

## 2018-12-02 ENCOUNTER — Ambulatory Visit: Payer: 59 | Admitting: Endocrinology

## 2018-12-07 ENCOUNTER — Encounter: Payer: Self-pay | Admitting: Endocrinology

## 2018-12-07 ENCOUNTER — Ambulatory Visit (INDEPENDENT_AMBULATORY_CARE_PROVIDER_SITE_OTHER): Payer: 59 | Admitting: Endocrinology

## 2018-12-07 ENCOUNTER — Other Ambulatory Visit: Payer: Self-pay

## 2018-12-07 VITALS — BP 120/80 | HR 89 | Ht 65.0 in | Wt 242.2 lb

## 2018-12-07 DIAGNOSIS — E0590 Thyrotoxicosis, unspecified without thyrotoxic crisis or storm: Secondary | ICD-10-CM

## 2018-12-07 NOTE — Patient Instructions (Signed)
blood tests are requested for you today.  We'll let you know about the results.  If ever you have fever while taking methimazole, stop it and call us, even if the reason is obvious, because of the risk of a rare side-effect.   Please come back for a follow-up appointment in 6 months.  Please call sooner if the pregnancy happens.

## 2018-12-07 NOTE — Progress Notes (Signed)
Subjective:    Patient ID: [name] [name], female    DOB: [birth year], 33 y.o.   MRN: [id number]  HPI Pt returns for f/u of hyperthyroidism (dx'ed 2018; she chose tapazole rx; [name] was c/w thyroiditis; she has factors favoring both nodular goiter and Grave's Dz; she chose tapazole rx; spirometry showed moderate obstruction, but no extrapulmonary component; she has IUD).  pt states she feels well in general.    Past Medical History:  Diagnosis Date  . Allergic rhinitis   . Bronchitis   . Dyspnea    3 times a week  . Headache   . Hyperthyroidism   . Neuromuscular disorder ([place])    carpel tunnel left hand  . Pre-diabetes   . SVD (spontaneous vaginal delivery) 2004   x 1    Past Surgical History:  Procedure Laterality Date  . DILATION AND CURETTAGE OF UTERUS     termination x 1  . MYOMECTOMY N/A 12/01/2017   Procedure: MYOMECTOMY;  Surgeon: Christophe Louis, MD;  Location: [facility];  Service: Gynecology;  Laterality: N/A;  . WISDOM TOOTH EXTRACTION      Social History   Socioeconomic History  . Marital status: Married    Spouse name: Barbaraann Rondo  . Number of children: 1  . Years of education: Not on file  . Highest education level: Not on file  Occupational History  . Not on file  Social Needs  . Financial resource strain: Not on file  . Food insecurity    Worry: Not on file    Inability: Not on file  . Transportation needs    Medical: Not on file    Non-medical: Not on file  Tobacco Use  . Smoking status: Former Smoker    Packs/day: 0.25    Years: 17.00    Pack years: 4.25    Types: Cigars    Quit date: 10/31/2017    Years since quitting: 1.1  . Smokeless tobacco: Never Used  . Tobacco comment: Not smoked since 10/2017  Substance and Sexual Activity  . Alcohol use: Yes    Comment: Once or twice a month  . Drug use: No  . Sexual activity: Yes    Birth control/protection: I.U.D.    Comment: Mirena IUD  Lifestyle  . Physical activity    Days per week: Not on file   Minutes per session: Not on file  . Stress: Not on file  Relationships  . Social Herbalist on phone: Not on file    Gets together: Not on file    Attends religious service: Not on file    Active member of club or organization: Not on file    Attends meetings of clubs or organizations: Not on file    Relationship status: Not on file  . Intimate partner violence    Fear of current or ex partner: Not on file    Emotionally abused: Not on file    Physically abused: Not on file    Forced sexual activity: Not on file  Other Topics Concern  . Not on file  Social History Narrative  . Not on file    Current Outpatient Medications on File Prior to Visit  Medication Sig Dispense Refill  . albuterol (PROVENTIL HFA;VENTOLIN HFA) 108 (90 Base) MCG/ACT inhaler Inhale 1-2 puffs into the lungs every 6 (six) hours as needed for wheezing or shortness of breath. 1 Inhaler 0  . albuterol (PROVENTIL) (2.5 MG/3ML) 0.083% nebulizer solution Take 3 mLs (2.5  mg total) by nebulization every 6 (six) hours as needed for wheezing or shortness of breath. 75 mL 0  . fluticasone (FLONASE) 50 MCG/ACT nasal spray Place 2 sprays into both nostrils daily. 1 g 0  . ibuprofen (ADVIL,MOTRIN) 800 MG tablet Take 1 tablet (800 mg total) by mouth every 8 (eight) hours as needed (mild pain). 30 tablet 0  . levonorgestrel (MIRENA) 20 MCG/24HR IUD 1 each by Intrauterine route once.    . methimazole (TAPAZOLE) 5 MG tablet TAKE 1 TABLET BY MOUTH 3 TIMES A WEEK 13 tablet 5  . montelukast (SINGULAIR) 10 MG tablet Take 1 tablet (10 mg total) by mouth at bedtime. 30 tablet 0  . Polyethyl Glycol-Propyl Glycol (LUBRICANT EYE DROPS) 0.4-0.3 % SOLN Place 1 drop into both eyes 3 (three) times daily as needed (dry eyes.).     No current facility-administered medications on file prior to visit.     No Known Allergies  Family History  Problem Relation Age of Onset  . Heart disease Father   . Thyroid disease Neg Hx     BP  120/80 (BP Location: Right Arm, Patient Position: Sitting, Cuff Size: Large)   Pulse 89   Ht 5\' 5"  (1.651 m)   Wt 242 lb 3.2 oz (109.9 kg)   SpO2 98%   BMI 40.30 kg/m   Review of Systems Denies fever    Objective:   Physical Exam VITAL SIGNS:  See vs page GENERAL: no distress NECK: thyroid is slightly and diffusely enlarged.  No thyroid nodule is palpable.  No palpable lymphadenopathy at the anterior neck.   Lab Results  Component Value Date   TSH 1.21 12/07/2018      Assessment & Plan:  Hyperthyroidsm: well-controlled.  Please continue the same medication.  Wheezing: in this setting, she should maintain euthyroidism.  Patient Instructions  blood tests are requested for you today.  We'll let you know about the results.  If ever you have fever while taking methimazole, stop it and call us, even if the reason is obvious, because of the risk of a rare side-effect.   Please come back for a follow-up appointment in 6 months.  Please call sooner if the pregnancy happens.

## 2018-12-08 LAB — T4, FREE: Free T4: 0.8 ng/dL (ref 0.60–1.60)

## 2018-12-08 LAB — TSH: TSH: 1.21 u[IU]/mL (ref 0.35–4.50)

## 2019-01-14 ENCOUNTER — Other Ambulatory Visit: Payer: Self-pay | Admitting: Endocrinology

## 2019-01-24 ENCOUNTER — Encounter: Payer: Self-pay | Admitting: Nurse Practitioner

## 2019-01-24 ENCOUNTER — Other Ambulatory Visit: Payer: Self-pay

## 2019-01-24 ENCOUNTER — Ambulatory Visit: Payer: 59 | Admitting: Nurse Practitioner

## 2019-01-24 VITALS — BP 122/80 | HR 64 | Temp 97.5°F | Ht 65.2 in | Wt 243.2 lb

## 2019-01-24 DIAGNOSIS — Z111 Encounter for screening for respiratory tuberculosis: Secondary | ICD-10-CM | POA: Diagnosis not present

## 2019-01-24 DIAGNOSIS — Z0289 Encounter for other administrative examinations: Secondary | ICD-10-CM

## 2019-01-24 NOTE — Progress Notes (Signed)
Subjective:     Patient ID: [name] [name] , female    DOB: [birth year] , 33 y.o.   MRN: [id number]   Chief Complaint  Patient presents with  . 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      Past Medical History:  Diagnosis Date  . Allergic rhinitis   . Bronchitis   . Dyspnea    3 times a week  . Headache   . Hyperthyroidism   . Neuromuscular disorder ([place])    carpel tunnel left hand  . Pre-diabetes   . SVD (spontaneous vaginal delivery) 2004   x 1     Family History  Problem Relation Age of Onset  . Heart disease Father   . Thyroid disease Neg Hx      Current Outpatient Medications:  .  albuterol (PROVENTIL HFA;VENTOLIN HFA) 108 (90 Base) MCG/ACT inhaler, Inhale 1-2 puffs into the lungs every 6 (six) hours as needed for wheezing or shortness of breath., Disp: 1 Inhaler, Rfl: 0 .  fluticasone (FLONASE) 50 MCG/ACT nasal spray, Place 2 sprays into both nostrils daily., Disp: 1 g, Rfl: 0 .  ibuprofen (ADVIL,MOTRIN) 800 MG tablet, Take 1 tablet (800 mg total) by mouth every 8 (eight) hours as needed (mild pain)., Disp: 30 tablet, Rfl: 0 .  levonorgestrel (MIRENA) 20 MCG/24HR IUD, 1 each by Intrauterine route once., Disp: , Rfl:  .  methimazole (TAPAZOLE) 5 MG tablet, TAKE 1 TABLET BY MOUTH 3 TIMES A WEEK, Disp: 13 tablet, Rfl: 5 .  Polyethyl Glycol-Propyl Glycol (LUBRICANT EYE DROPS) 0.4-0.3 % SOLN, Place 1 drop into both eyes 3 (three) times daily as needed (dry eyes.)., Disp: , Rfl:  .  albuterol (PROVENTIL) (2.5 MG/3ML) 0.083% nebulizer solution, Take 3 mLs (2.5 mg total) by nebulization every 6 (six) hours as needed for wheezing or shortness of breath. (Patient not taking: Reported on 01/24/2019), Disp: 75 mL, Rfl: 0 .  montelukast (SINGULAIR) 10 MG tablet, Take 1 tablet (10 mg total) by mouth at bedtime. (Patient not taking: Reported on 01/24/2019),  Disp: 30 tablet, Rfl: 0   No Known Allergies   Review of Systems  Constitutional: Negative.   Respiratory: Negative.   Cardiovascular: Negative.  Negative for chest pain, palpitations and leg swelling.     Today's Vitals   01/24/19 1116  BP: 122/80  Pulse: 64  Temp: (!) 97.5 F (36.4 C)  TempSrc: Oral  Weight: 243 lb 3.2 oz (110.3 kg)  Height: 5' 5.2" (1.656 m)  PainSc: 0-No pain   Body mass index is 40.22 kg/m.   Objective:  Physical Exam Constitutional:      Appearance: Normal appearance.  Pulmonary:     Effort: Pulmonary effort is normal.  Skin:    Capillary Refill: Capillary refill takes less than 2 seconds.  Neurological:     General: No focal deficit present.     Mental Status: She is alert and oriented to person, place, and time.  Psychiatric:        Mood and Affect: Mood normal.        Behavior: Behavior normal.        Thought Content: Thought content normal.        Judgment: Judgment normal.         Assessment And Plan:     1. 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

## 2019-01-25 IMAGING — CR DG CHEST 2V
2 series · 2 of 2 positions shown · non-contrast
Comparison: 08/26/2015

CLINICAL DATA: Shortness of breath.  Cough and chest congestion.

EXAM:
CHEST  2 VIEW

[w chest pa]
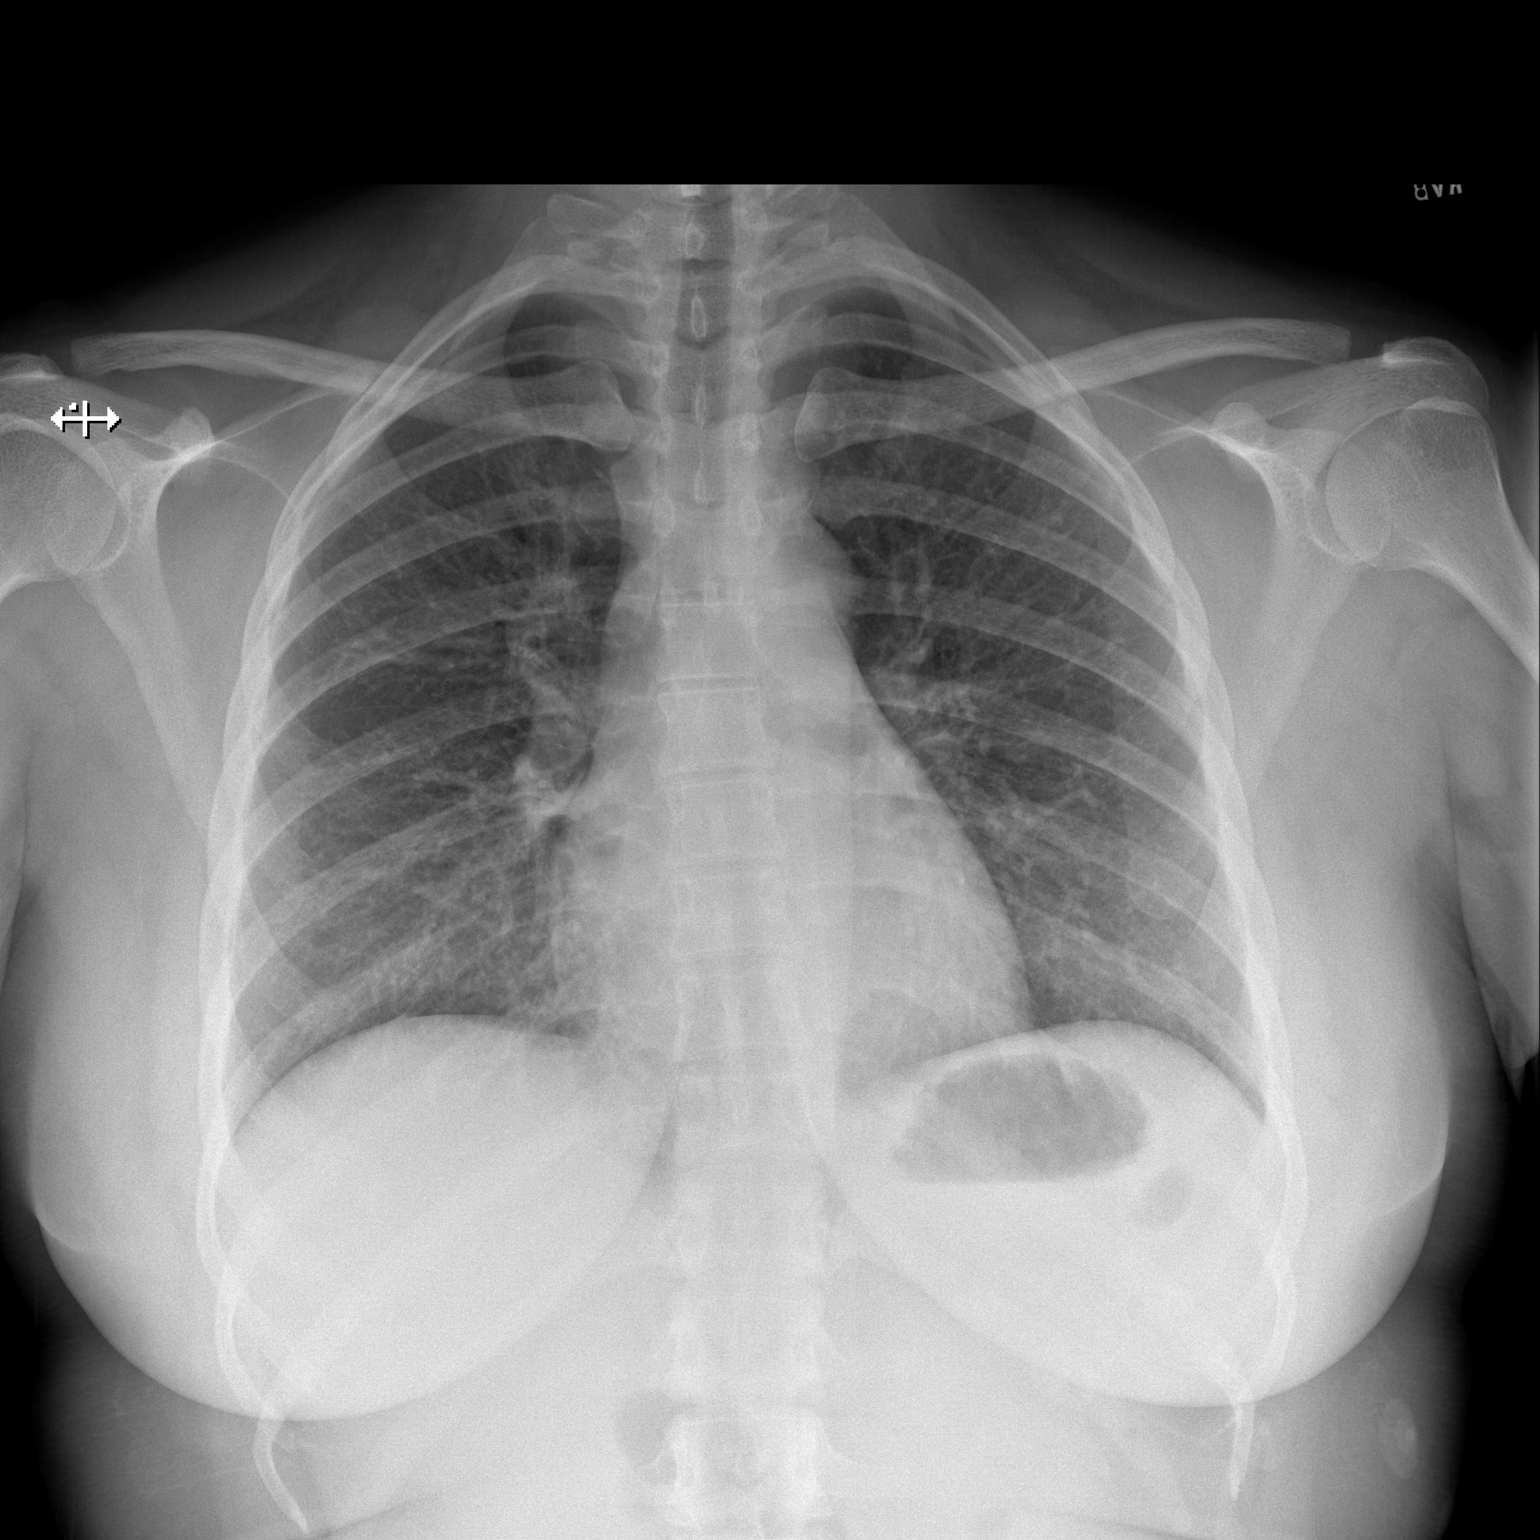

[w chest lat]
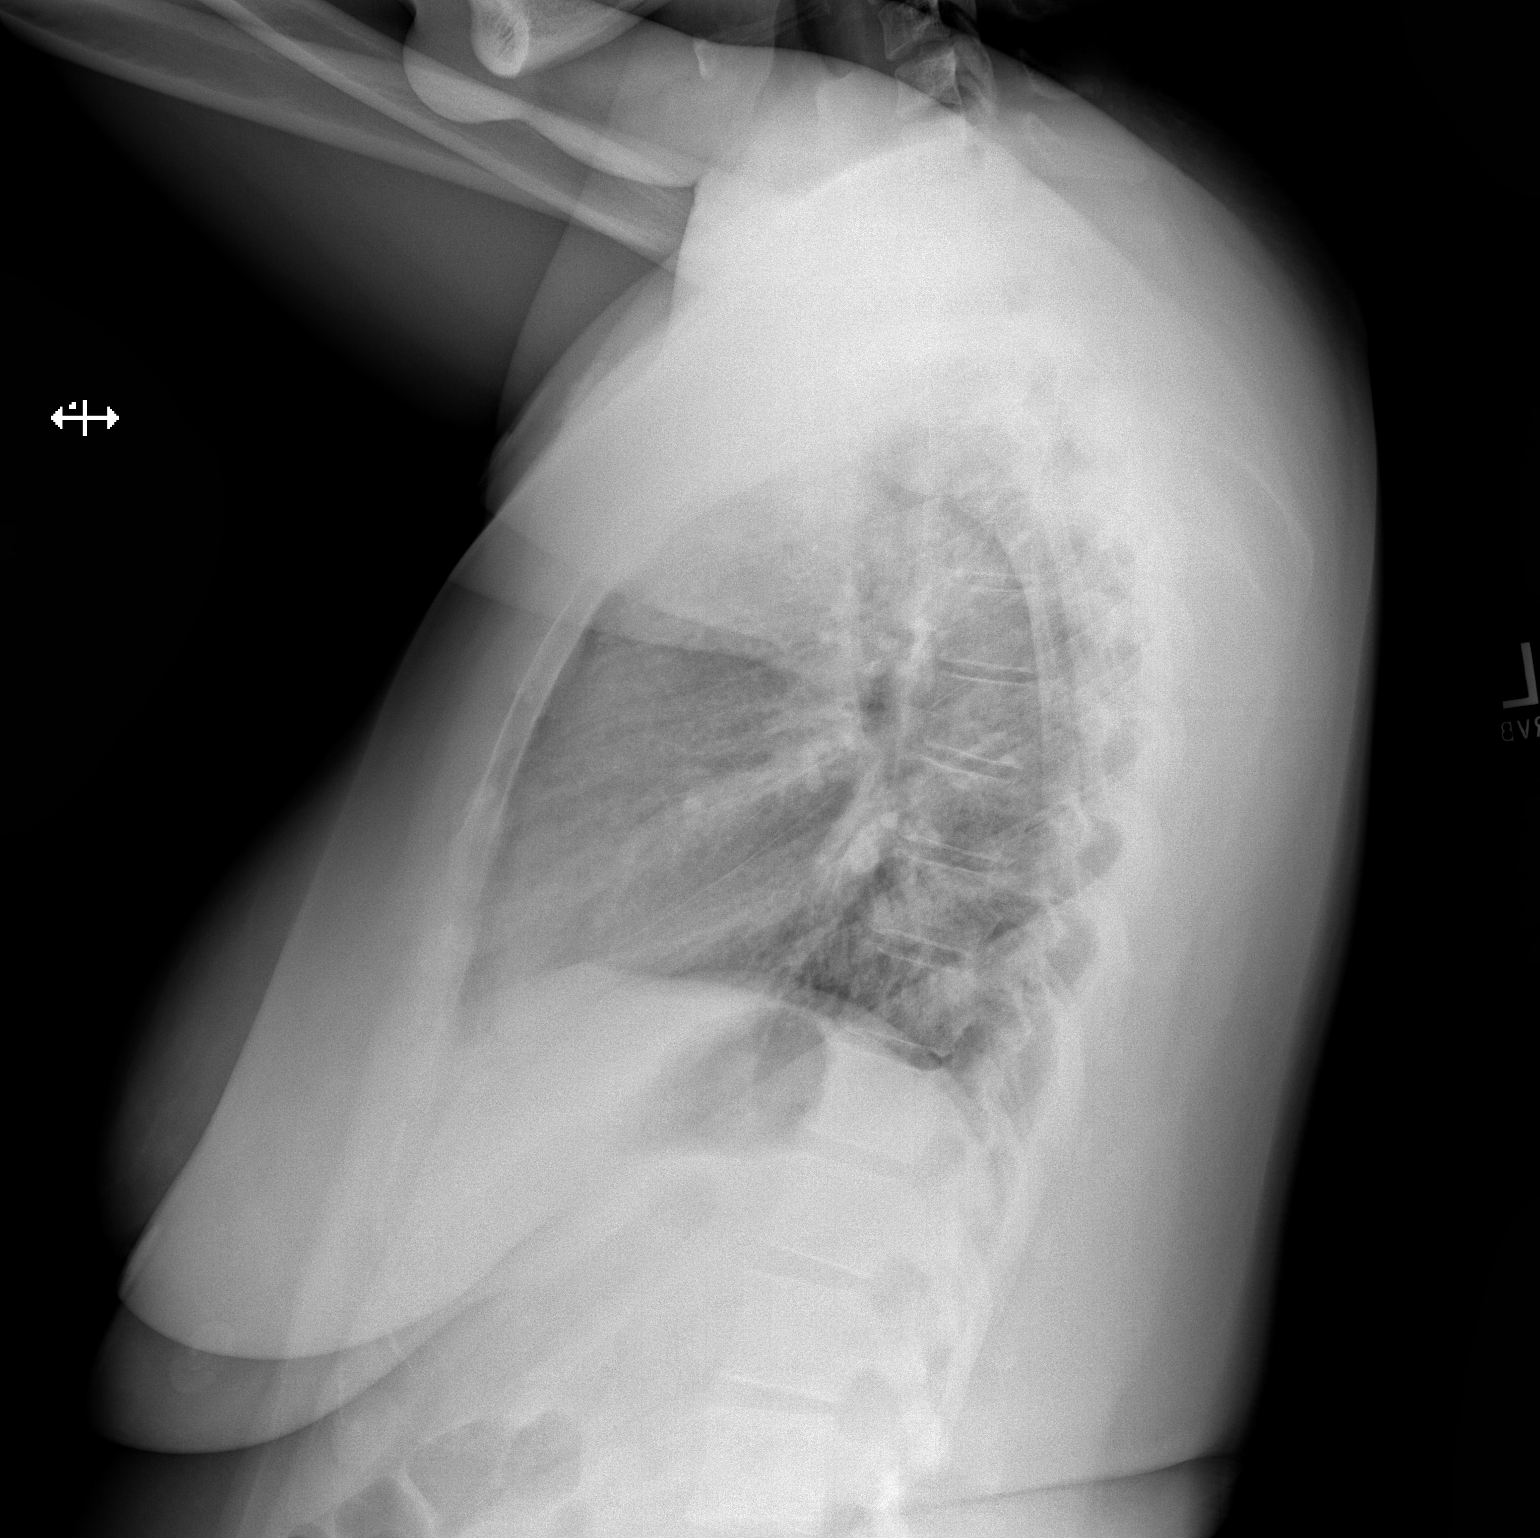

[2 of 2 positions shown; findings below may reference images not displayed]

FINDINGS: The heart size and mediastinal contours are within normal limits.
Both lungs are clear except for slight peribronchial thickening. The
visualized skeletal structures are unremarkable.
IMPRESSION: Minimal bronchitic changes.

## 2019-01-26 LAB — QUANTIFERON-TB GOLD PLUS
QuantiFERON Mitogen Value: >10 IU/mL
QuantiFERON Nil Value: 0.04 IU/mL
QuantiFERON TB1 Ag Value: 0.19 IU/mL
QuantiFERON TB2 Ag Value: 0.23 IU/mL
QuantiFERON-TB Gold Plus: NEGATIVE

## 2019-03-14 ENCOUNTER — Other Ambulatory Visit: Payer: Self-pay

## 2019-03-14 ENCOUNTER — Telehealth (INDEPENDENT_AMBULATORY_CARE_PROVIDER_SITE_OTHER): Payer: 59 | Admitting: Nurse Practitioner

## 2019-03-14 ENCOUNTER — Ambulatory Visit: Payer: 59 | Attending: Internal Medicine

## 2019-03-14 ENCOUNTER — Encounter: Payer: Self-pay | Admitting: Nurse Practitioner

## 2019-03-14 VITALS — Ht 65.0 in | Wt 245.0 lb

## 2019-03-14 DIAGNOSIS — J029 Acute pharyngitis, unspecified: Secondary | ICD-10-CM | POA: Diagnosis not present

## 2019-03-14 DIAGNOSIS — Z20828 Contact with and (suspected) exposure to other viral communicable diseases: Secondary | ICD-10-CM | POA: Diagnosis not present

## 2019-03-14 DIAGNOSIS — Z20822 Contact with and (suspected) exposure to covid-19: Secondary | ICD-10-CM

## 2019-03-14 NOTE — Patient Instructions (Signed)
   Call [facility] to see if she needs to do any additional screening

## 2019-03-14 NOTE — Progress Notes (Signed)
Virtual Visit via Video   This visit type was conducted due to national recommendations for restrictions regarding the COVID-19 Pandemic (e.g. social distancing) in an effort to limit this patient's exposure and mitigate transmission in our community.  Due to her co-morbid illnesses, this patient is at least at moderate risk for complications without adequate follow up.  This format is felt to be most appropriate for this patient at this time.  All issues noted in this document were discussed and addressed.  A limited physical exam was performed with this format.    This visit type was conducted due to national recommendations for restrictions regarding the COVID-19 Pandemic (e.g. social distancing) in an effort to limit this patient's exposure and mitigate transmission in our community.  Patients identity confirmed using two different identifiers.  This format is felt to be most appropriate for this patient at this time.  All issues noted in this document were discussed and addressed.  No physical exam was performed (except for noted visual exam findings with Video Visits).    Date:  99991111   ID:  [name] [name], DOB 1985/04/20, MRN [id number]  Patient Location:  Home - spoke with [name]  Provider location:   Office    Chief Complaint:  Sore throat  History of Present Illness:    [name] [name] is a 33 y.o. female who presents via video conferencing for a telehealth visit today.    The patient does have symptoms concerning for COVID-19 infection sore throat.     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  Lives with her husband and her daughter  Sore Throat  This is a new problem. The current episode started in the past 7 days (Saturday morning). The problem has been gradually improving. Pertinent negatives include no congestion (mild), coughing, ear pain, headaches, shortness of breath or vomiting. Associated symptoms comments: Post nasal drainage.  Treatments tried: halls and hot tea.     Past Medical History:  Diagnosis Date  . Allergic rhinitis   . Bronchitis   . Dyspnea    3 times a week  . Headache   . Hyperthyroidism   . Neuromuscular disorder ([place])    carpel tunnel left hand  . Pre-diabetes   . SVD (spontaneous vaginal delivery) 2004   x 1   Past Surgical History:  Procedure Laterality Date  . DILATION AND CURETTAGE OF UTERUS     termination x 1  . MYOMECTOMY N/A 12/01/2017   Procedure: MYOMECTOMY;  Surgeon: Christophe Louis, MD;  Location: [facility];  Service: Gynecology;  Laterality: N/A;  . WISDOM TOOTH EXTRACTION       Current Meds  Medication Sig  . albuterol (PROVENTIL HFA;VENTOLIN HFA) 108 (90 Base) MCG/ACT inhaler Inhale 1-2 puffs into the lungs every 6 (six) hours as needed for wheezing or shortness of breath.  . fluticasone (FLONASE) 50 MCG/ACT nasal spray Place 2 sprays into both nostrils daily.  Marland Kitchen ibuprofen (ADVIL,MOTRIN) 800 MG tablet Take 1 tablet (800 mg total) by mouth every 8 (eight) hours as needed (mild pain).  Marland Kitchen levonorgestrel (MIRENA) 20 MCG/24HR IUD 1 each by Intrauterine route once.  . methimazole (TAPAZOLE) 5 MG tablet TAKE 1 TABLET BY MOUTH 3 TIMES A WEEK     Allergies:   Patient has no known allergies.   Social History   Tobacco Use  . Smoking status: Former Smoker    Packs/day: 0.25    Years: 17.00  Pack years: 4.25    Types: Cigars    Quit date: 10/31/2017    Years since quitting: 1.3  . Smokeless tobacco: Never Used  . Tobacco comment: Not smoked since 10/2017  Substance Use Topics  . Alcohol use: Yes    Comment: Once or twice a month  . Drug use: No     Family Hx: The patient's family history includes Heart disease in her father. There is no history of Thyroid disease.  ROS:   Please see the history of present illness.    Review of Systems  Constitutional: Negative.   HENT: Positive for sore throat. Negative for congestion (mild) and ear pain.   Respiratory: Negative for  cough, sputum production, shortness of breath and wheezing.   Cardiovascular: Negative.   Gastrointestinal: Negative for vomiting.  Neurological: Negative for dizziness and headaches.  Psychiatric/Behavioral: Negative.     All other systems reviewed and are negative.   Labs/Other Tests and Data Reviewed:    Recent Labs: 12/07/2018: TSH 1.21   Recent Lipid Panel No results found for: CHOL, TRIG, HDL, CHOLHDL, LDLCALC, LDLDIRECT  Wt Readings from Last 3 Encounters:  03/14/19 245 lb (111.1 kg)  01/24/19 243 lb 3.2 oz (110.3 kg)  12/07/18 242 lb 3.2 oz (109.9 kg)     Exam:    Vital Signs:  Ht 5\' 5"  (1.651 m)   Wt 245 lb (111.1 kg)   BMI 40.77 kg/m     Physical Exam  Constitutional: She is well-developed, well-nourished, and in no distress. No distress.  Pulmonary/Chest: Effort normal. No respiratory distress.  Neurological: She is alert.  Psychiatric: Mood, memory, affect and judgment normal.    ASSESSMENT & PLAN:    1. Sore throat  Will send for covid testing information given  Warm salt water gargles  She is to self isolate as much as possible until she receives her results.    COVID-19 Education: The signs and symptoms of COVID-19 were discussed with the patient and how to seek care for testing (follow up with PCP or arrange E-visit).  The importance of social distancing was discussed today.  Patient Risk:   After full review of this patients clinical status, I feel that they are at least moderate risk at this time.  Time:   Today, I have spent 11 minutes/ seconds with the patient with telehealth technology discussing above diagnoses.     Medication Adjustments/Labs and Tests Ordered: Current medicines are reviewed at length with the patient today.  Concerns regarding medicines are outlined above.   Tests Ordered: No orders of the defined types were placed in this encounter.   Medication Changes: No orders of the defined types were placed in this  encounter.   Disposition:  Follow up prn  Signed, Minette Brine, FNP

## 2019-03-15 LAB — NOVEL CORONAVIRUS, NAA: SARS-CoV-2, NAA: NOT DETECTED

## 2019-04-11 ENCOUNTER — Other Ambulatory Visit: Payer: Self-pay

## 2019-04-11 ENCOUNTER — Encounter: Payer: Self-pay | Admitting: Nurse Practitioner

## 2019-04-11 ENCOUNTER — Ambulatory Visit: Payer: Self-pay | Admitting: Nurse Practitioner

## 2019-04-11 VITALS — BP 122/80 | HR 73 | Temp 98.5°F | Ht 65.0 in | Wt 240.2 lb

## 2019-04-11 DIAGNOSIS — E0590 Thyrotoxicosis, unspecified without thyrotoxic crisis or storm: Secondary | ICD-10-CM

## 2019-04-11 DIAGNOSIS — R0789 Other chest pain: Secondary | ICD-10-CM

## 2019-04-11 DIAGNOSIS — Z0000 Encounter for general adult medical examination without abnormal findings: Secondary | ICD-10-CM

## 2019-04-11 NOTE — Progress Notes (Signed)
This visit occurred during the SARS-CoV-2 public health emergency.  Safety protocols were in place, including screening questions prior to the visit, additional usage of staff PPE, and extensive cleaning of exam room while observing appropriate contact time as indicated for disinfecting solutions.  Subjective:     Patient ID: [name] [name] , female    DOB: 27-Jan-1986 , 34 y.o.   MRN: [id number]   Chief Complaint  Patient presents with  . Annual Exam    HPI  Here for HM    Wt Readings from Last 3 Encounters: 04/11/19 : 240 lb 3.2 oz (109 kg) 03/14/19 : 245 lb (111.1 kg) 01/24/19 : 243 lb 3.2 oz (110.3 kg)    The patient states she use an IUD for birth control.     Negative for Dysmenorrhea and Negative for Menorrhagia Her GYN is at [facility] - GYN Dr. Christophe Louis.  Negative for: breast discharge, breast lump(s), breast pain and breast self exam.  Pertinent negatives include abnormal bleeding (hematology), anxiety, decreased libido, depression, difficulty falling sleep, dyspareunia, history of infertility, nocturia, sexual dysfunction, sleep disturbances, urinary incontinence, urinary urgency, vaginal discharge and vaginal itching. Diet regular.  The patient states her exercise level is minimal, she is walking more at work.     The patient's tobacco use is:  Social History   Tobacco Use  Smoking Status Former Smoker  . Packs/day: 0.25  . Years: 17.00  . Pack years: 4.25  . Types: Cigars  . Quit date: 10/31/2017  . Years since quitting: 1.4  Smokeless Tobacco Never Used  Tobacco Comment   Not smoked since 10/2017   She has been exposed to passive smoke. The patient's alcohol use is:  Social History   Substance and Sexual Activity  Alcohol Use Yes   Comment: Once or twice a month   Additional information: Last pap 2018. She had a myomectomy in 2018 did not remove all of them and unable to get IUD out.  She had been referred to [facility].  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    Past Medical History:  Diagnosis Date  . Allergic rhinitis   . Bronchitis   . Dyspnea    3 times a week  . Headache   . Hyperthyroidism   . Neuromuscular disorder ([place])    carpel tunnel left hand  . Pre-diabetes   . SVD (spontaneous vaginal delivery) 2004   x 1     Family History  Problem Relation Age of Onset  . Heart disease Father   . Thyroid disease Neg Hx      Current Outpatient Medications:  .  fluticasone (FLONASE) 50 MCG/ACT nasal spray, Place 2 sprays into both nostrils daily., Disp: 1 g, Rfl: 0 .  ibuprofen (ADVIL,MOTRIN) 800 MG tablet, Take 1 tablet (800 mg total) by mouth every 8 (eight) hours as needed (mild pain)., Disp: 30 tablet, Rfl: 0 .  levonorgestrel (MIRENA) 20 MCG/24HR IUD, 1 each by Intrauterine route once., Disp: , Rfl:  .  methimazole (TAPAZOLE) 5 MG tablet, TAKE 1 TABLET BY MOUTH 3 TIMES A WEEK, Disp: 13 tablet, Rfl: 5 .  Polyethyl Glycol-Propyl Glycol (LUBRICANT EYE DROPS) 0.4-0.3 % SOLN, Place 1 drop into both eyes 3 (three) times daily as needed (dry eyes.)., Disp: , Rfl:  .  albuterol (PROVENTIL HFA;VENTOLIN HFA) 108 (90 Base) MCG/ACT inhaler, Inhale 1-2 puffs into the lungs every 6 (six) hours as needed for wheezing or shortness of breath. (Patient not taking: Reported  on 04/11/2019), Disp: 1 Inhaler, Rfl: 0 .  albuterol (PROVENTIL) (2.5 MG/3ML) 0.083% nebulizer solution, Take 3 mLs (2.5 mg total) by nebulization every 6 (six) hours as needed for wheezing or shortness of breath. (Patient not taking: Reported on 01/24/2019), Disp: 75 mL, Rfl: 0 .  montelukast (SINGULAIR) 10 MG tablet, Take 1 tablet (10 mg total) by mouth at bedtime. (Patient not taking: Reported on 04/11/2019), Disp: 30 tablet, Rfl: 0   No Known Allergies   Review of Systems  Constitutional: Negative.   HENT: Negative.   Eyes: Negative.   Respiratory: Negative.  Negative for cough and shortness of breath.   Cardiovascular: Positive for chest pain (sharp intermittent and  pulling pain between breast bone).  Gastrointestinal: Negative.   Endocrine: Negative.   Genitourinary: Negative.   Musculoskeletal: Negative.   Skin: Negative.   Allergic/Immunologic: Negative.   Neurological: Negative.  Negative for dizziness and headaches.  Hematological: Negative.   Psychiatric/Behavioral: Negative.  The patient is not hyperactive.      Today's Vitals   04/11/19 0953  BP: 122/80  Pulse: 73  Temp: 98.5 F (36.9 C)  TempSrc: Oral  Weight: 240 lb 3.2 oz (109 kg)  Height: 5' 5" (1.651 m)  PainSc: 0-No pain   Body mass index is 39.97 kg/m.   Objective:  Physical Exam Vitals reviewed.  Constitutional:      General: She is not in acute distress.    Appearance: Normal appearance. She is well-developed. She is obese.  HENT:     Head: Normocephalic and atraumatic.     Right Ear: Hearing, tympanic membrane, ear canal and external ear normal. There is no impacted cerumen.     Left Ear: Hearing, tympanic membrane, ear canal and external ear normal. There is no impacted cerumen.  Eyes:     General: Lids are normal.     Conjunctiva/sclera: Conjunctivae normal.     Pupils: Pupils are equal, round, and reactive to light.     Funduscopic exam:    Right eye: No papilledema.        Left eye: No papilledema.  Neck:     Thyroid: No thyroid mass.     Vascular: No carotid bruit.  Cardiovascular:     Rate and Rhythm: Normal rate and regular rhythm.     Pulses: Normal pulses.     Heart sounds: Normal heart sounds. No murmur.  Pulmonary:     Effort: Pulmonary effort is normal. No respiratory distress.     Breath sounds: Normal breath sounds.  Abdominal:     General: Abdomen is flat. Bowel sounds are normal. There is no distension.     Palpations: Abdomen is soft.     Tenderness: There is no abdominal tenderness.  Musculoskeletal:        General: No swelling. Normal range of motion.     Cervical back: Full passive range of motion without pain, normal range of  motion and neck supple.     Right lower leg: No edema.     Left lower leg: No edema.  Skin:    General: Skin is warm and dry.     Capillary Refill: Capillary refill takes less than 2 seconds.  Neurological:     General: No focal deficit present.     Mental Status: She is alert and oriented to person, place, and time.     Cranial Nerves: No cranial nerve deficit.     Sensory: No sensory deficit.  Psychiatric:  Mood and Affect: Mood normal.        Behavior: Behavior normal.        Thought Content: Thought content normal.        Judgment: Judgment normal.         Assessment And Plan:     1. Health maintenance examination Behavior modifications discussed and diet history reviewed.   Pt will continue to exercise regularly and modify diet with low GI, plant based foods and decrease intake of processed foods.  Recommend intake of daily multivitamin, Vitamin D, and calcium.  Recommend for preventive screenings, as well as recommend immunizations that include influenza, TDAP (will get copy of immunizations from [facility] - CMP14+EGFR - Lipid panel - CBC  2. Hyperthyroidism  Chronic, continue with follow up with Dr. Loanne Drilling  3. Other chest pain  Mild tenderness palpated to upper mid-chest medial area.   I recommend she has an EKG due to her paternal medical history of cardiac disease however she refuses.      Minette Brine, FNP    THE PATIENT IS ENCOURAGED TO PRACTICE SOCIAL DISTANCING DUE TO THE COVID-19 PANDEMIC.

## 2019-04-11 NOTE — Patient Instructions (Addendum)
Health Maintenance  Topic Date Due  . HIV Screening  11/02/1998  . TETANUS/TDAP  04/10/2020 (Originally 11/02/2002)  . PAP SMEAR-Modifier  05/24/2020  . INFLUENZA VACCINE  Completed   Health Maintenance, Female Adopting a healthy lifestyle and getting preventive care are important in promoting health and wellness. Ask your health care provider about:  The right schedule for you to have regular tests and exams.  Things you can do on your own to prevent diseases and keep yourself healthy. What should I know about diet, weight, and exercise? Eat a healthy diet   Eat a diet that includes plenty of vegetables, fruits, low-fat dairy products, and lean protein.  Do not eat a lot of foods that are high in solid fats, added sugars, or sodium. Maintain a healthy weight Body mass index (BMI) is used to identify weight problems. It estimates body fat based on height and weight. Your health care provider can help determine your BMI and help you achieve or maintain a healthy weight. Get regular exercise Get regular exercise. This is one of the most important things you can do for your health. Most adults should:  Exercise for at least 150 minutes each week. The exercise should increase your heart rate and make you sweat (moderate-intensity exercise).  Do strengthening exercises at least twice a week. This is in addition to the moderate-intensity exercise.  Spend less time sitting. Even light physical activity can be beneficial. Watch cholesterol and blood lipids Have your blood tested for lipids and cholesterol at 36 years of age, then have this test every 5 years. Have your cholesterol levels checked more often if:  Your lipid or cholesterol levels are high.  You are older than 36 years of age.  You are at high risk for heart disease. What should I know about cancer screening? Depending on your health history and family history, you may need to have cancer screening at various ages. This  may include screening for:  Breast cancer.  Cervical cancer.  Colorectal cancer.  Skin cancer.  Lung cancer. What should I know about heart disease, diabetes, and high blood pressure? Blood pressure and heart disease  High blood pressure causes heart disease and increases the risk of stroke. This is more likely to develop in people who have high blood pressure readings, are of African descent, or are overweight.  Have your blood pressure checked: ? Every 3-5 years if you are 2-28 years of age. ? Every year if you are 47 years old or older. Diabetes Have regular diabetes screenings. This checks your fasting blood sugar level. Have the screening done:  Once every three years after age 16 if you are at a normal weight and have a low risk for diabetes.  More often and at a younger age if you are overweight or have a high risk for diabetes. What should I know about preventing infection? Hepatitis B If you have a higher risk for hepatitis B, you should be screened for this virus. Talk with your health care provider to find out if you are at risk for hepatitis B infection. Hepatitis C Testing is recommended for:  Everyone born from [birth year] through 1967.  Anyone with known risk factors for hepatitis C. Sexually transmitted infections (STIs)  Get screened for STIs, including gonorrhea and chlamydia, if: ? You are sexually active and are younger than 36 years of age. ? You are older than 36 years of age and your health care provider tells you that you are at  risk for this type of infection. ? Your sexual activity has changed since you were last screened, and you are at increased risk for chlamydia or gonorrhea. Ask your health care provider if you are at risk.  Ask your health care provider about whether you are at high risk for HIV. Your health care provider may recommend a prescription medicine to help prevent HIV infection. If you choose to take medicine to prevent HIV, you should  first get tested for HIV. You should then be tested every 3 months for as long as you are taking the medicine. Pregnancy  If you are about to stop having your period (premenopausal) and you may become pregnant, seek counseling before you get pregnant.  Take 400 to 800 micrograms (mcg) of folic acid every day if you become pregnant.  Ask for birth control (contraception) if you want to prevent pregnancy. Osteoporosis and menopause Osteoporosis is a disease in which the bones lose minerals and strength with aging. This can result in bone fractures. If you are 55 years old or older, or if you are at risk for osteoporosis and fractures, ask your health care provider if you should:  Be screened for bone loss.  Take a calcium or vitamin D supplement to lower your risk of fractures.  Be given hormone replacement therapy (HRT) to treat symptoms of menopause. Follow these instructions at home: Lifestyle  Do not use any products that contain nicotine or tobacco, such as cigarettes, e-cigarettes, and chewing tobacco. If you need help quitting, ask your health care provider.  Do not use street drugs.  Do not share needles.  Ask your health care provider for help if you need support or information about quitting drugs. Alcohol use  Do not drink alcohol if: ? Your health care provider tells you not to drink. ? You are pregnant, may be pregnant, or are planning to become pregnant.  If you drink alcohol: ? Limit how much you use to 0-1 drink a day. ? Limit intake if you are breastfeeding.  Be aware of how much alcohol is in your drink. In the [place] one drink equals one 12 oz bottle of beer (355 mL), one 5 oz glass of wine (148 mL), or one 1 oz glass of hard liquor (44 mL). General instructions  Schedule regular health, dental, and eye exams.  Stay current with your vaccines.  Tell your health care provider if: ? You often feel depressed. ? You have ever been abused or do not feel safe  at home. Summary  Adopting a healthy lifestyle and getting preventive care are important in promoting health and wellness.  Follow your health care provider's instructions about healthy diet, exercising, and getting tested or screened for diseases.  Follow your health care provider's instructions on monitoring your cholesterol and blood pressure. This information is not intended to replace advice given to you by your health care provider. Make sure you discuss any questions you have with your health care provider. Document Revised: 02/23/2018 Document Reviewed: 02/23/2018 Elsevier Patient Education  [place].    Chest Wall Pain Chest wall pain is pain in or around the bones and muscles of your chest. Chest wall pain may be caused by:  An injury.  Coughing a lot.  Using your chest and arm muscles too much. Sometimes, the cause may not be known. This pain may take a few weeks or longer to get better. Follow these instructions at home: Managing pain, stiffness, and swelling If told, put  ice on the painful area:  Put ice in a plastic bag.  Place a towel between your skin and the bag.  Leave the ice on for 20 minutes, 2-3 times a day.  Activity  Rest as told by your doctor.  Avoid doing things that cause pain. This includes lifting heavy items.  Ask your doctor what activities are safe for you. General instructions   Take over-the-counter and prescription medicines only as told by your doctor.  Do not use any products that contain nicotine or tobacco, such as cigarettes, e-cigarettes, and chewing tobacco. If you need help quitting, ask your doctor.  Keep all follow-up visits as told by your doctor. This is important. Contact a doctor if:  You have a fever.  Your chest pain gets worse.  You have new symptoms. Get help right away if:  You feel sick to your stomach (nauseous) or you throw up (vomit).  You feel sweaty or light-headed.  You have a cough  with mucus from your lungs (sputum) or you cough up blood.  You are short of breath. These symptoms may be an emergency. Do not wait to see if the symptoms will go away. Get medical help right away. Call your local emergency services (911 in the [place] Do not drive yourself to the hospital. Summary  Chest wall pain is pain in or around the bones and muscles of your chest.  It may be treated with ice, rest, and medicines. Your condition may also get better if you avoid doing things that cause pain.  Contact a doctor if you have a fever, chest pain that gets worse, or new symptoms.  Get help right away if you feel light-headed or you get short of breath. These symptoms may be an emergency. This information is not intended to replace advice given to you by your health care provider. Make sure you discuss any questions you have with your health care provider. Document Revised: 09/02/2017 Document Reviewed: 09/02/2017 Elsevier Patient Education  2020 [employer].

## 2019-04-12 LAB — CBC
Hematocrit: 40.1 % (ref 34.0–46.6)
Hemoglobin: 13.5 g/dL (ref 11.1–15.9)
MCH: 28.6 pg (ref 26.6–33.0)
MCHC: 33.7 g/dL (ref 31.5–35.7)
MCV: 85 fL (ref 79–97)
Platelets: 388 10*3/uL (ref 150–450)
RBC: 4.72 x10E6/uL (ref 3.77–5.28)
RDW: 13.3 % (ref 11.7–15.4)
WBC: 6.5 10*3/uL (ref 3.4–10.8)

## 2019-04-12 LAB — CMP14+EGFR
ALT: 30 IU/L (ref 0–32)
AST: 25 IU/L (ref 0–40)
Albumin/Globulin Ratio: 1.7 (ref 1.2–2.2)
Albumin: 4.3 g/dL (ref 3.8–4.8)
Alkaline Phosphatase: 97 IU/L (ref 39–117)
BUN/Creatinine Ratio: 10 (ref 9–23)
BUN: 7 mg/dL (ref 6–20)
Bilirubin Total: 0.2 mg/dL (ref 0.0–1.2)
CO2: 21 mmol/L (ref 20–29)
Calcium: 9.2 mg/dL (ref 8.7–10.2)
Chloride: 103 mmol/L (ref 96–106)
Creatinine, Ser: 0.7 mg/dL (ref 0.57–1.00)
GFR calc Af Amer: 130 mL/min/{1.73_m2} (ref >59)
GFR calc non Af Amer: 113 mL/min/{1.73_m2} (ref >59)
Globulin, Total: 2.6 g/dL (ref 1.5–4.5)
Glucose: 98 mg/dL (ref 65–99)
Potassium: 4.5 mmol/L (ref 3.5–5.2)
Sodium: 138 mmol/L (ref 134–144)
Total Protein: 6.9 g/dL (ref 6.0–8.5)

## 2019-04-12 LAB — LIPID PANEL
Chol/HDL Ratio: 4.2 ratio (ref 0.0–4.4)
Cholesterol, Total: 151 mg/dL (ref 100–199)
HDL: 36 mg/dL — ABNORMAL LOW (ref >39)
LDL Chol Calc (NIH): 101 mg/dL — ABNORMAL HIGH (ref 0–99)
Triglycerides: 72 mg/dL (ref 0–149)
VLDL Cholesterol Cal: 14 mg/dL (ref 5–40)

## 2019-05-02 ENCOUNTER — Telehealth: Payer: Self-pay | Admitting: Nurse Practitioner

## 2019-05-02 NOTE — Telephone Encounter (Signed)
Left voicemail for Patient to return call to schedule appt for Immunizations for her Health Exam , LVMM for patient to return call to schedule.

## 2019-05-08 ENCOUNTER — Ambulatory Visit: Payer: Self-pay

## 2019-05-10 ENCOUNTER — Ambulatory Visit: Payer: Self-pay | Attending: Internal Medicine

## 2019-05-10 DIAGNOSIS — Z20822 Contact with and (suspected) exposure to covid-19: Secondary | ICD-10-CM | POA: Insufficient documentation

## 2019-05-11 LAB — NOVEL CORONAVIRUS, NAA: SARS-CoV-2, NAA: NOT DETECTED

## 2019-05-17 NOTE — Telephone Encounter (Signed)
LVM for patient to return call concerning her Health Examination Certificate.

## 2019-06-05 ENCOUNTER — Other Ambulatory Visit: Payer: Self-pay

## 2019-06-07 ENCOUNTER — Ambulatory Visit: Payer: 59 | Admitting: Endocrinology

## 2019-06-09 ENCOUNTER — Encounter: Payer: Self-pay | Admitting: Endocrinology

## 2019-06-09 ENCOUNTER — Ambulatory Visit (INDEPENDENT_AMBULATORY_CARE_PROVIDER_SITE_OTHER): Payer: BC Managed Care – PPO | Admitting: Endocrinology

## 2019-06-09 ENCOUNTER — Other Ambulatory Visit: Payer: Self-pay

## 2019-06-09 VITALS — BP 104/70 | HR 81 | Ht 65.0 in | Wt 241.0 lb

## 2019-06-09 DIAGNOSIS — E0590 Thyrotoxicosis, unspecified without thyrotoxic crisis or storm: Secondary | ICD-10-CM | POA: Diagnosis not present

## 2019-06-09 LAB — TSH: TSH: 0.69 u[IU]/mL (ref 0.35–4.50)

## 2019-06-09 LAB — T4, FREE: Free T4: 0.77 ng/dL (ref 0.60–1.60)

## 2019-06-09 NOTE — Progress Notes (Signed)
Subjective:    Patient ID: [name] [name], female    DOB: 14-Mar-1986, 34 y.o.   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  pt states she feels well in general.   Past Medical History:  Diagnosis Date  . Allergic rhinitis   . Bronchitis   . Dyspnea    3 times a week  . Headache   . Hyperthyroidism   . Neuromuscular disorder ([place])    carpel tunnel left hand  . Pre-diabetes   . SVD (spontaneous vaginal delivery) 2004   x 1    Past Surgical History:  Procedure Laterality Date  . DILATION AND CURETTAGE OF UTERUS     termination x 1  . MYOMECTOMY N/A 12/01/2017   Procedure: MYOMECTOMY;  Surgeon: Christophe Louis, MD;  Location: [facility];  Service: Gynecology;  Laterality: N/A;  . WISDOM TOOTH EXTRACTION      Social History   Socioeconomic History  . Marital status: Married    Spouse name: Barbaraann Rondo  . Number of children: 1  . Years of education: Not on file  . Highest education level: Not on file  Occupational History  . Not on file  Tobacco Use  . Smoking status: Former Smoker    Packs/day: 0.25    Years: 17.00    Pack years: 4.25    Types: Cigars    Quit date: 10/31/2017    Years since quitting: 1.6  . Smokeless tobacco: Never Used  . Tobacco comment: Not smoked since 10/2017  Substance and Sexual Activity  . Alcohol use: Yes    Comment: Once or twice a month  . Drug use: No  . Sexual activity: Yes    Birth control/protection: I.U.D.    Comment: Mirena IUD  Other Topics Concern  . Not on file  Social History Narrative  . Not on file   Social Determinants of Health   Financial Resource Strain:   . Difficulty of Paying Living Expenses:   Food Insecurity:   . Worried About Charity fundraiser in the Last Year:   . Youth worker in the Last Year:   Transportation Needs:   . Film/video editor (Medical):   Marland Kitchen Lack of Transportation (Non-Medical):   Physical Activity:   . Days of Exercise per Week:   . Minutes of Exercise per Session:   Stress:   . Feeling of Stress :   Social Connections:   . Frequency of Communication with Friends and Family:   . Frequency of Social Gatherings with Friends and Family:   . Attends Religious Services:   . Active Member of Clubs or Organizations:   . Attends Archivist Meetings:   Marland Kitchen Marital Status:   Intimate Partner Violence:   . Fear of Current or Ex-Partner:   . Emotionally Abused:   Marland Kitchen Physically Abused:   . Sexually Abused:     Current Outpatient Medications on File Prior to Visit  Medication Sig Dispense Refill  . albuterol (PROVENTIL HFA;VENTOLIN HFA) 108 (90 Base) MCG/ACT inhaler Inhale 1-2 puffs into the lungs every 6 (six) hours as needed for wheezing or shortness of breath. 1 Inhaler 0  . albuterol (PROVENTIL) (2.5 MG/3ML) 0.083% nebulizer solution Take 3 mLs (2.5 mg total) by nebulization every  6 (six) hours as needed for wheezing or shortness of breath. 75 mL 0  . fluticasone (FLONASE) 50 MCG/ACT nasal spray Place 2 sprays into both nostrils daily. 1 g 0  . ibuprofen (ADVIL,MOTRIN) 800 MG tablet Take 1 tablet (800 mg total) by mouth every 8 (eight) hours as needed (mild pain). 30 tablet 0  . levonorgestrel (MIRENA) 20 MCG/24HR IUD 1 each by Intrauterine route once.    . methimazole (TAPAZOLE) 5 MG tablet TAKE 1 TABLET BY MOUTH 3 TIMES A WEEK 13 tablet 5  . montelukast (SINGULAIR) 10 MG tablet Take 1 tablet (10 mg total) by mouth at bedtime. 30 tablet 0  . Polyethyl Glycol-Propyl Glycol (LUBRICANT EYE DROPS) 0.4-0.3 % SOLN Place 1 drop into both eyes 3 (three) times daily as needed (dry eyes.).     No current facility-administered medications on file prior to visit.    No Known Allergies  Family History  Problem Relation Age of Onset  .  Heart disease Father   . Thyroid disease Neg Hx     BP 104/70   Pulse 81   Ht 5\' 5"  (1.651 m)   Wt 241 lb (109.3 kg)   SpO2 96%   BMI 40.10 kg/m    Review of Systems Denies fever    Objective:   Physical Exam VITAL SIGNS:  See vs page GENERAL: no distress NECK: thyroid is 3 times normal size, with irreg surface.     Lab Results  Component Value Date   TSH 0.69 06/09/2019      Assessment & Plan:  Hyperthyroidism: well-controlled Goiter: clinically stable.  We'll follow.   Patient Instructions  Blood tests are requested for you today.  We'll let you know about the results.  If ever you have fever while taking methimazole, stop it and call us, even if the reason is obvious, because of the risk of a rare side-effect. It is best to never miss the medication.  However, if you do miss it, next best is to double up the next time.   Please come back for a follow-up appointment in 6 months.

## 2019-06-09 NOTE — Patient Instructions (Signed)
Blood tests are requested for you today.  We'll let you know about the results.  If ever you have fever while taking methimazole, stop it and call us, even if the reason is obvious, because of the risk of a rare side-effect. It is best to never miss the medication.  However, if you do miss it, next best is to double up the next time.   Please come back for a follow-up appointment in 6 months.

## 2019-07-12 ENCOUNTER — Other Ambulatory Visit: Payer: Self-pay

## 2019-08-03 ENCOUNTER — Other Ambulatory Visit: Payer: Self-pay | Admitting: Endocrinology

## 2019-12-13 ENCOUNTER — Ambulatory Visit: Payer: Self-pay | Admitting: Endocrinology

## 2019-12-15 ENCOUNTER — Ambulatory Visit: Payer: Self-pay | Admitting: Endocrinology

## 2020-02-17 ENCOUNTER — Other Ambulatory Visit: Payer: Self-pay

## 2020-02-17 DIAGNOSIS — Z20822 Contact with and (suspected) exposure to covid-19: Secondary | ICD-10-CM

## 2020-02-19 LAB — SARS-COV-2, NAA 2 DAY TAT

## 2020-02-19 LAB — NOVEL CORONAVIRUS, NAA: SARS-CoV-2, NAA: NOT DETECTED

## 2020-02-21 ENCOUNTER — Other Ambulatory Visit: Payer: Self-pay

## 2020-02-21 ENCOUNTER — Emergency Department (HOSPITAL_COMMUNITY)
Admission: EM | Admit: 2020-02-21 | Discharge: 2020-02-21 | Disposition: A | Discharge status: Home / Self Care | Payer: Self-pay | Attending: Pediatric Emergency Medicine | Admitting: Pediatric Emergency Medicine

## 2020-02-21 ENCOUNTER — Encounter (HOSPITAL_COMMUNITY): Payer: Self-pay

## 2020-02-21 ENCOUNTER — Emergency Department (HOSPITAL_COMMUNITY)
Admission: EM | Admit: 2020-02-21 | Discharge: 2020-02-21 | Disposition: A | Discharge status: Home / Self Care | Payer: Self-pay | Attending: Emergency Medicine | Admitting: Emergency Medicine

## 2020-02-21 DIAGNOSIS — R059 Cough, unspecified: Secondary | ICD-10-CM | POA: Insufficient documentation

## 2020-02-21 DIAGNOSIS — Z5321 Procedure and treatment not carried out due to patient leaving prior to being seen by health care provider: Secondary | ICD-10-CM | POA: Insufficient documentation

## 2020-02-21 DIAGNOSIS — Z87891 Personal history of nicotine dependence: Secondary | ICD-10-CM | POA: Insufficient documentation

## 2020-02-21 DIAGNOSIS — R519 Headache, unspecified: Secondary | ICD-10-CM | POA: Insufficient documentation

## 2020-02-21 DIAGNOSIS — Z20822 Contact with and (suspected) exposure to covid-19: Secondary | ICD-10-CM | POA: Insufficient documentation

## 2020-02-21 LAB — RESP PANEL BY RT-PCR (FLU A&B, COVID) ARPGX2
Influenza A by PCR: NEGATIVE
Influenza B by PCR: NEGATIVE
SARS Coronavirus 2 by RT PCR: NEGATIVE

## 2020-02-21 NOTE — ED Provider Notes (Signed)
[facility] EMERGENCY DEPARTMENT Provider Note   CSN: [id number] Arrival date & time: 02/21/20  1851     History No chief complaint on file.   [name] [name] is a 34 y.o. female.  Patient reports that her daughter's father tested positive for Covid and that her daughter also has symptoms nasal congestion.  Patient reports she has no symptoms whatsoever.  Patient denies any fever cough congestion runny nose vomiting diarrhea chest pain.  The history is provided by the patient. No language interpreter was used.  Illness Severity:  Unable to specify Onset quality:  Unable to specify Timing:  Unable to specify Progression:  Unable to specify Associated symptoms: no abdominal pain, no chest pain, no cough, no diarrhea, no fever, no headaches, no nausea, no rhinorrhea, no sore throat, no vomiting and no wheezing        Past Medical History:  Diagnosis Date  . Allergic rhinitis   . Bronchitis   . Dyspnea    3 times a week  . Headache   . Hyperthyroidism   . Neuromuscular disorder ([place])    carpel tunnel left hand  . Pre-diabetes   . SVD (spontaneous vaginal delivery) 2004   x 1    Patient Active Problem List   Diagnosis Date Noted  . Wheezing 06/23/2018  . Acute bronchitis 06/23/2018  . S/P myomectomy 12/01/2017  . Dyspnea 03/29/2017  . Allergic rhinitis   . Hyperthyroidism 01/29/2017    Past Surgical History:  Procedure Laterality Date  . DILATION AND CURETTAGE OF UTERUS     termination x 1  . MYOMECTOMY N/A 12/01/2017   Procedure: MYOMECTOMY;  Surgeon: Christophe Louis, MD;  Location: [facility];  Service: Gynecology;  Laterality: N/A;  . WISDOM TOOTH EXTRACTION       OB History   No obstetric history on file.     Family History  Problem Relation Age of Onset  . Heart disease Father   . Thyroid disease Neg Hx     Social History   Tobacco Use  . Smoking status: Former Smoker    Packs/day: 0.25    Years: 17.00    Pack years: 4.25     Types: Cigars    Quit date: 10/31/2017    Years since quitting: 2.3  . Smokeless tobacco: Never Used  . Tobacco comment: Not smoked since 10/2017  Vaping Use  . Vaping Use: Never used  Substance Use Topics  . Alcohol use: Yes    Comment: Once or twice a month  . Drug use: No    Home Medications Prior to Admission medications   Medication Sig Start Date End Date Taking? Authorizing Provider  albuterol (PROVENTIL HFA;VENTOLIN HFA) 108 (90 Base) MCG/ACT inhaler Inhale 1-2 puffs into the lungs every 6 (six) hours as needed for wheezing or shortness of breath. 06/20/18   Tasia Catchings, Amy V, PA-C  albuterol (PROVENTIL) (2.5 MG/3ML) 0.083% nebulizer solution Take 3 mLs (2.5 mg total) by nebulization every 6 (six) hours as needed for wheezing or shortness of breath. 06/20/18   Tasia Catchings, Amy V, PA-C  fluticasone (FLONASE) 50 MCG/ACT nasal spray Place 2 sprays into both nostrils daily. 06/20/18   Tasia Catchings, Amy V, PA-C  ibuprofen (ADVIL,MOTRIN) 800 MG tablet Take 1 tablet (800 mg total) by mouth every 8 (eight) hours as needed (mild pain). 12/02/17   Christophe Louis, MD  levonorgestrel (MIRENA) 20 MCG/24HR IUD 1 each by Intrauterine route once.    [provider]  methimazole (TAPAZOLE) 5  MG tablet TAKE 1 TABLET BY MOUTH 3 TIMES A WEEK 08/03/19   Renato Shin, MD  montelukast (SINGULAIR) 10 MG tablet Take 1 tablet (10 mg total) by mouth at bedtime. 06/20/18   Tasia Catchings, Amy V, PA-C  Polyethyl Glycol-Propyl Glycol (LUBRICANT EYE DROPS) 0.4-0.3 % SOLN Place 1 drop into both eyes 3 (three) times daily as needed (dry eyes.).    [provider]    Allergies    Patient has no known allergies.  Review of Systems   Review of Systems  Constitutional: Negative for fever.  HENT: Negative for rhinorrhea and sore throat.   Respiratory: Negative for cough and wheezing.   Cardiovascular: Negative for chest pain.  Gastrointestinal: Negative for abdominal pain, diarrhea, nausea and vomiting.  Neurological: Negative for headaches.   All other systems reviewed and are negative.   Physical Exam Updated Vital Signs There were no vitals taken for this visit.  Physical Exam Vitals and nursing note reviewed.  Constitutional:      Appearance: Normal appearance.  HENT:     Head: Normocephalic and atraumatic.     Mouth/Throat:     Mouth: Mucous membranes are moist.  Eyes:     Conjunctiva/sclera: Conjunctivae normal.  Cardiovascular:     Rate and Rhythm: Normal rate.     Pulses: Normal pulses.  Pulmonary:     Effort: Pulmonary effort is normal. No respiratory distress.  Abdominal:     General: Abdomen is flat. There is no distension.  Musculoskeletal:     Cervical back: Normal range of motion.  Skin:    General: Skin is dry.     Capillary Refill: Capillary refill takes less than 2 seconds.  Neurological:     General: No focal deficit present.     Mental Status: She is alert and oriented to person, place, and time.     ED Results / Procedures / Treatments   Labs (all labs ordered are listed, but only abnormal results are displayed) Labs Reviewed  RESP PANEL BY RT-PCR (FLU A&B, COVID) [id number]    EKG None  Radiology No results found.  Procedures Procedures (including critical care time)  Medications Ordered in ED Medications - No data to display  ED Course  I have reviewed the triage vital signs and the nursing notes.  Pertinent labs & imaging results that were available during my care of the patient were reviewed by me and considered in my medical decision making (see chart for details).    MDM Rules/Calculators/A&P                          34 y.o. with close Covid exposure here for Covid testing patient denies any symptoms and is alert and interactive in the room will swab for Covid and discharged home.  Patient will follow up with her result in my chart.  Discussed specific signs and symptoms of concern for which they should return to ED.  Discharge with close follow up with primary care  physician as needed.  Patient comfortable with this plan of care.  Final Clinical Impression(s) / ED Diagnoses Final diagnoses:  Close exposure to COVID-19 virus    Rx / DC Orders ED Discharge Orders    None       Genevive Bi, MD 02/21/20 1916

## 2020-02-21 NOTE — ED Notes (Signed)
Pt sts she is going to [place]

## 2020-02-21 NOTE — ED Triage Notes (Signed)
Pt reports recent COVID exposure-  Pt c/o headaches, but reports she often has headaches, not necessarily due to covid. Denies shob, cp, intermittent cough.

## 2020-02-21 NOTE — ED Triage Notes (Signed)
Requesting covid test. Husband COVID+

## 2020-02-21 NOTE — ED Notes (Signed)
Pt in no acute distress. Easy WOB.

## 2020-02-26 ENCOUNTER — Other Ambulatory Visit: Payer: Self-pay | Admitting: Endocrinology

## 2020-04-15 ENCOUNTER — Encounter: Payer: Self-pay | Admitting: Nurse Practitioner

## 2020-06-26 DIAGNOSIS — Z30431 Encounter for routine checking of intrauterine contraceptive device: Secondary | ICD-10-CM | POA: Diagnosis not present

## 2020-06-26 DIAGNOSIS — N911 Secondary amenorrhea: Secondary | ICD-10-CM | POA: Diagnosis not present

## 2020-06-26 DIAGNOSIS — D259 Leiomyoma of uterus, unspecified: Secondary | ICD-10-CM | POA: Diagnosis not present

## 2020-06-26 DIAGNOSIS — T8332XA Displacement of intrauterine contraceptive device, initial encounter: Secondary | ICD-10-CM | POA: Diagnosis not present

## 2020-07-01 ENCOUNTER — Telehealth: Payer: Self-pay | Admitting: Endocrinology

## 2020-07-01 NOTE — Telephone Encounter (Signed)
Error

## 2020-07-07 ENCOUNTER — Emergency Department (HOSPITAL_COMMUNITY): Payer: BC Managed Care – PPO

## 2020-07-07 ENCOUNTER — Encounter (HOSPITAL_COMMUNITY): Payer: Self-pay

## 2020-07-07 ENCOUNTER — Emergency Department (HOSPITAL_COMMUNITY)
Admission: EM | Admit: 2020-07-07 | Discharge: 2020-07-07 | Disposition: A | Discharge status: Home / Self Care | Payer: BC Managed Care – PPO | Attending: Emergency Medicine | Admitting: Emergency Medicine

## 2020-07-07 DIAGNOSIS — R079 Chest pain, unspecified: Secondary | ICD-10-CM | POA: Diagnosis not present

## 2020-07-07 DIAGNOSIS — Z87891 Personal history of nicotine dependence: Secondary | ICD-10-CM | POA: Diagnosis not present

## 2020-07-07 DIAGNOSIS — R110 Nausea: Secondary | ICD-10-CM | POA: Diagnosis not present

## 2020-07-07 DIAGNOSIS — S161XXA Strain of muscle, fascia and tendon at neck level, initial encounter: Secondary | ICD-10-CM | POA: Insufficient documentation

## 2020-07-07 DIAGNOSIS — R21 Rash and other nonspecific skin eruption: Secondary | ICD-10-CM | POA: Diagnosis not present

## 2020-07-07 DIAGNOSIS — M25512 Pain in left shoulder: Secondary | ICD-10-CM | POA: Diagnosis not present

## 2020-07-07 DIAGNOSIS — Y92410 Unspecified street and highway as the place of occurrence of the external cause: Secondary | ICD-10-CM | POA: Insufficient documentation

## 2020-07-07 DIAGNOSIS — S199XXA Unspecified injury of neck, initial encounter: Secondary | ICD-10-CM | POA: Diagnosis not present

## 2020-07-07 MED ORDER — CYCLOBENZAPRINE HCL 10 MG PO TABS: 10.0000 mg | ORAL_TABLET | Freq: Two times a day (BID) | ORAL | 0 refills | Status: DC | PRN
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
## 2020-07-09 ENCOUNTER — Other Ambulatory Visit: Payer: Self-pay

## 2020-07-09 ENCOUNTER — Encounter: Payer: Self-pay | Admitting: Nurse Practitioner

## 2020-07-09 ENCOUNTER — Ambulatory Visit: Payer: BC Managed Care – PPO | Admitting: Nurse Practitioner

## 2020-07-09 VITALS — BP 116/70 | HR 66 | Temp 98.0°F | Ht 64.4 in | Wt 244.0 lb

## 2020-07-09 DIAGNOSIS — R0789 Other chest pain: Secondary | ICD-10-CM

## 2020-07-09 DIAGNOSIS — E559 Vitamin D deficiency, unspecified: Secondary | ICD-10-CM

## 2020-07-09 DIAGNOSIS — L819 Disorder of pigmentation, unspecified: Secondary | ICD-10-CM | POA: Diagnosis not present

## 2020-07-09 MED ORDER — VALACYCLOVIR HCL 500 MG PO TABS: 500.0000 mg | ORAL_TABLET | Freq: Two times a day (BID) | ORAL | 0 refills | Status: AC
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
## 2020-07-10 LAB — CBC
Hematocrit: 41.8 % (ref 34.0–46.6)
Hemoglobin: 13.3 g/dL (ref 11.1–15.9)
MCH: 27.4 pg (ref 26.6–33.0)
MCHC: 31.8 g/dL (ref 31.5–35.7)
MCV: 86 fL (ref 79–97)
Platelets: 342 10*3/uL (ref 150–450)
RBC: 4.85 x10E6/uL (ref 3.77–5.28)
RDW: 13.8 % (ref 11.7–15.4)
WBC: 8.2 10*3/uL (ref 3.4–10.8)

## 2020-07-10 LAB — VITAMIN D 25 HYDROXY (VIT D DEFICIENCY, FRACTURES): Vit D, 25-Hydroxy: 8.7 ng/mL — ABNORMAL LOW (ref 30.0–100.0)

## 2020-07-10 LAB — INSULIN, RANDOM: INSULIN: 40.6 u[IU]/mL — ABNORMAL HIGH (ref 2.6–24.9)

## 2020-07-10 LAB — VITAMIN B12: Vitamin B-12: 404 pg/mL (ref 232–1245)

## 2020-07-10 LAB — HEPATITIS C ANTIBODY: Hep C Virus Ab: <0.1 s/co ratio (ref 0.0–0.9)

## 2020-07-11 ENCOUNTER — Other Ambulatory Visit: Payer: Self-pay | Admitting: Obstetrics and Gynecology

## 2020-07-11 ENCOUNTER — Ambulatory Visit (INDEPENDENT_AMBULATORY_CARE_PROVIDER_SITE_OTHER): Payer: BC Managed Care – PPO | Admitting: Endocrinology

## 2020-07-11 ENCOUNTER — Other Ambulatory Visit: Payer: Self-pay

## 2020-07-11 VITALS — BP 124/74 | HR 69 | Ht 64.4 in | Wt 243.0 lb

## 2020-07-11 DIAGNOSIS — E0590 Thyrotoxicosis, unspecified without thyrotoxic crisis or storm: Secondary | ICD-10-CM | POA: Diagnosis not present

## 2020-07-11 DIAGNOSIS — D259 Leiomyoma of uterus, unspecified: Secondary | ICD-10-CM

## 2020-07-11 DIAGNOSIS — T8332XA Displacement of intrauterine contraceptive device, initial encounter: Secondary | ICD-10-CM

## 2020-07-11 LAB — T4, FREE: Free T4: 0.81 ng/dL (ref 0.60–1.60)

## 2020-07-11 LAB — TSH: TSH: 1.17 u[IU]/mL (ref 0.35–4.50)

## 2020-07-11 NOTE — Progress Notes (Signed)
Subjective:    Patient ID: [name] [name], female    DOB: 05-May-1985, 35 y.o.   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  pt states she feels well in general.  Pt says she often misses the methimazole.   Past Medical History:  Diagnosis Date  . Allergic rhinitis   . Bronchitis   . Dyspnea    3 times a week  . Headache   . Hyperthyroidism   . Neuromuscular disorder ([place])    carpel tunnel left hand  . Pre-diabetes   . SVD (spontaneous vaginal delivery) 2004   x 1    Past Surgical History:  Procedure Laterality Date  . DILATION AND CURETTAGE OF UTERUS     termination x 1  . MYOMECTOMY N/A 12/01/2017   Procedure: MYOMECTOMY;  Surgeon: Christophe Louis, MD;  Location: [facility];  Service: Gynecology;  Laterality: N/A;  . WISDOM TOOTH EXTRACTION      Social History   Socioeconomic History  . Marital status: Married    Spouse name: Barbaraann Rondo  . Number of children: 1  . Years of education: Not on file  . Highest education level: Not on file  Occupational History  . Not on file  Tobacco Use  . Smoking status: Former Smoker    Packs/day: 0.25    Years: 17.00    Pack years: 4.25    Types: Cigars    Quit date: 10/31/2017    Years since quitting: 2.7  . Smokeless tobacco: Never Used  . Tobacco comment: Not smoked since 10/2017  Vaping Use  . Vaping Use: Never used  Substance and Sexual Activity  . Alcohol use: Yes    Comment: Once or twice a month  . Drug use: No  . Sexual activity: Yes    Birth control/protection: I.U.D.    Comment: Mirena IUD  Other Topics Concern  . Not on file  Social History Narrative  . Not on file   Social Determinants of Health   Financial Resource Strain: Not on file  Food Insecurity: Not on file   Transportation Needs: Not on file  Physical Activity: Not on file  Stress: Not on file  Social Connections: Not on file  Intimate Partner Violence: Not on file    Current Outpatient Medications on File Prior to Visit  Medication Sig Dispense Refill  . albuterol (PROVENTIL HFA;VENTOLIN HFA) 108 (90 Base) MCG/ACT inhaler Inhale 1-2 puffs into the lungs every 6 (six) hours as needed for wheezing or shortness of breath. 1 Inhaler 0  . albuterol (PROVENTIL) (2.5 MG/3ML) 0.083% nebulizer solution Take 3 mLs (2.5 mg total) by nebulization every 6 (six) hours as needed for wheezing or shortness of breath. 75 mL 0  . cyclobenzaprine (FLEXERIL) 10 MG tablet Take 1 tablet (10 mg total) by mouth 2 (two) times daily as needed for muscle spasms. 10 tablet 0  . fluticasone (FLONASE) 50 MCG/ACT nasal spray Place 2 sprays into both nostrils daily. 1 g 0  . ibuprofen (ADVIL,MOTRIN) 800 MG tablet Take 1 tablet (800 mg total) by mouth every 8 (eight) hours as needed (mild pain). 30 tablet 0  . levonorgestrel (MIRENA) 20 MCG/24HR IUD 1 each by Intrauterine route once.    . methimazole (TAPAZOLE) 5 MG tablet TAKE 1 TABLET  BY MOUTH 3 TIMES A WEEK 13 tablet 5  . montelukast (SINGULAIR) 10 MG tablet Take 1 tablet (10 mg total) by mouth at bedtime. 30 tablet 0  . Polyethyl Glycol-Propyl Glycol 0.4-0.3 % SOLN Place 1 drop into both eyes 3 (three) times daily as needed (dry eyes.).    Marland Kitchen valACYclovir (VALTREX) 500 MG tablet Take 1 tablet (500 mg total) by mouth 2 (two) times daily for 5 days. 10 tablet 0   No current facility-administered medications on file prior to visit.    No Known Allergies  Family History  Problem Relation Age of Onset  . Heart disease Father   . Thyroid disease Neg Hx     BP 124/74 (BP Location: Right Arm, Patient Position: Sitting, Cuff Size: Large)   Pulse 69   Ht 5' 4.4" (1.636 m)   Wt 243 lb (110.2 kg)   SpO2 98%   BMI 41.19 kg/m    Review of Systems Denies fever.       Objective:   Physical Exam VITAL SIGNS:  See vs page GENERAL: no distress NECK: thyroid is slightly enlarged, with irreg surface, but no palpable nodule.   Lab Results  Component Value Date   TSH 1.17 07/11/2020      Assessment & Plan:  Hyperthyroidism: well-controlled.  Please continue the same tapazole.

## 2020-07-11 NOTE — Patient Instructions (Signed)
Blood tests are requested for you today.  We'll let you know about the results.  If ever you have fever while taking methimazole, stop it and call us, even if the reason is obvious, because of the risk of a rare side-effect. It is best to never miss the medication.  However, if you do miss it, next best is to double up the next time.   Please come back for a follow-up appointment in 6 months.

## 2020-07-19 ENCOUNTER — Other Ambulatory Visit: Payer: BC Managed Care – PPO

## 2020-07-26 ENCOUNTER — Ambulatory Visit
Admission: RE | Admit: 2020-07-26 | Discharge: 2020-07-26 | Disposition: A | Discharge status: Home / Self Care | Payer: BC Managed Care – PPO | Source: Ambulatory Visit | Attending: Obstetrics and Gynecology | Admitting: Obstetrics and Gynecology

## 2020-07-26 DIAGNOSIS — D259 Leiomyoma of uterus, unspecified: Secondary | ICD-10-CM | POA: Diagnosis not present

## 2020-07-26 DIAGNOSIS — T8332XA Displacement of intrauterine contraceptive device, initial encounter: Secondary | ICD-10-CM

## 2020-07-26 DIAGNOSIS — N852 Hypertrophy of uterus: Secondary | ICD-10-CM | POA: Diagnosis not present

## 2020-07-29 MED ORDER — VITAMIN D (ERGOCALCIFEROL) 1.25 MG (50000 UNIT) PO CAPS: 50000.0000 [IU] | ORAL_CAPSULE | ORAL | 1 refills | Status: DC

## 2020-08-05 ENCOUNTER — Encounter: Payer: Self-pay | Admitting: Nurse Practitioner

## 2020-08-05 ENCOUNTER — Other Ambulatory Visit: Payer: Self-pay

## 2020-08-05 DIAGNOSIS — E8881 Metabolic syndrome: Secondary | ICD-10-CM

## 2020-08-05 MED ORDER — RYBELSUS 7 MG PO TABS: 7.0000 mg | ORAL_TABLET | Freq: Every day | ORAL | 2 refills | Status: DC

## 2020-08-13 ENCOUNTER — Ambulatory Visit: Payer: Self-pay | Admitting: Endocrinology

## 2020-09-26 ENCOUNTER — Other Ambulatory Visit: Payer: Self-pay

## 2020-09-26 ENCOUNTER — Ambulatory Visit (INDEPENDENT_AMBULATORY_CARE_PROVIDER_SITE_OTHER): Payer: BC Managed Care – PPO | Admitting: Cardiovascular Disease

## 2020-09-26 ENCOUNTER — Encounter: Payer: Self-pay | Admitting: Cardiovascular Disease

## 2020-09-26 VITALS — BP 117/72 | HR 75 | Ht 65.0 in | Wt 231.0 lb

## 2020-09-26 DIAGNOSIS — R072 Precordial pain: Secondary | ICD-10-CM

## 2020-09-26 DIAGNOSIS — E785 Hyperlipidemia, unspecified: Secondary | ICD-10-CM | POA: Diagnosis not present

## 2020-09-26 NOTE — Progress Notes (Signed)
Cardiology Office Note:    Date:  1/85/6314   ID:  [name] [name], DOB 03-26-1985, MRN [id number]  PCP:  Minette Brine, FNP   [facility] Providers Cardiologist:  None     Referring MD: Minette Brine, FNP   No chief complaint on file. [name] [name] is a 35 y.o. female who is being seen today for the evaluation of chest discomfort at the request of Minette Brine, [place].   History of Present Illness:    [name] [name] is a 35 y.o. female with a hx of prediabetes, uterine fibroids, asthma and compensated hyperthyroidism here to discuss recent problems with chest discomfort.  Her chest pain is described as sharp.  It is not associated with physical activity.  It has occurred when she is just driving.  Sometimes it will occur after meals but this is not always the case.  She has tried to increase physical activity such as jogging, the symptoms do not occur.  However she is generally quite sedentary.  She has noticed tenderness to palpation along the left border of her sternum.  She has no known cardiovascular illness.  She has borderline hyperglycemia with a hemoglobin A1c most recently 5.7%.  She is taking Rybelsus, mostly an effort to lose weight.  She does not have hypertension or frank hypercholesterolemia but her HDL is low at 36.  She has never smoked.  Her father had bypass surgery in his late 63s and there is a family history of diabetes on his side of the family.  She has uterine fibroids which have made it impossible to remove Mirena IUD that has been in place for about 8 years.  Past Medical History:  Diagnosis Date   Allergic rhinitis    Bronchitis    Dyspnea    3 times a week   Headache    Hyperthyroidism    Neuromuscular disorder ([place])    carpel tunnel left hand   Pre-diabetes    SVD (spontaneous vaginal delivery) 2004   x 1    Past Surgical History:  Procedure Laterality Date   DILATION AND CURETTAGE OF UTERUS     termination x 1   MYOMECTOMY N/A  12/01/2017   Procedure: MYOMECTOMY;  Surgeon: Christophe Louis, MD;  Location: [facility];  Service: Gynecology;  Laterality: N/A;   WISDOM TOOTH EXTRACTION      Current Medications: Current Meds  Medication Sig   albuterol (PROVENTIL HFA;VENTOLIN HFA) 108 (90 Base) MCG/ACT inhaler Inhale 1-2 puffs into the lungs every 6 (six) hours as needed for wheezing or shortness of breath.   albuterol (PROVENTIL) (2.5 MG/3ML) 0.083% nebulizer solution Take 3 mLs (2.5 mg total) by nebulization every 6 (six) hours as needed for wheezing or shortness of breath.   cyclobenzaprine (FLEXERIL) 10 MG tablet Take 1 tablet (10 mg total) by mouth 2 (two) times daily as needed for muscle spasms.   fluticasone (FLONASE) 50 MCG/ACT nasal spray Place 2 sprays into both nostrils daily.   ibuprofen (ADVIL,MOTRIN) 800 MG tablet Take 1 tablet (800 mg total) by mouth every 8 (eight) hours as needed (mild pain).   levonorgestrel (MIRENA) 20 MCG/24HR IUD 1 each by Intrauterine route once.   methimazole (TAPAZOLE) 5 MG tablet TAKE 1 TABLET BY MOUTH 3 TIMES A WEEK   montelukast (SINGULAIR) 10 MG tablet Take 1 tablet (10 mg total) by mouth at bedtime.   Polyethyl Glycol-Propyl Glycol 0.4-0.3 % SOLN Place 1 drop into both eyes 3 (three) times daily as needed (dry  eyes.).   Semaglutide (RYBELSUS) 7 MG TABS Take 7 mg by mouth daily at 6 (six) AM.   Vitamin D, Ergocalciferol, (DRISDOL) 1.25 MG (50000 UNIT) CAPS capsule Take 1 capsule (50,000 Units total) by mouth 2 (two) times a week.     Allergies:   Patient has no known allergies.   Social History   Socioeconomic History   Marital status: Married    Spouse name: Barbaraann Rondo   Number of children: 1   Years of education: Not on file   Highest education level: Not on file  Occupational History   Not on file  Tobacco Use   Smoking status: Former    Packs/day: 0.25    Years: 17.00    Pack years: 4.25    Types: Cigars, Cigarettes    Quit date: 10/31/2017    Years since quitting: 2.9    Smokeless tobacco: Never   Tobacco comments:    Not smoked since 10/2017  Vaping Use   Vaping Use: Never used  Substance and Sexual Activity   Alcohol use: Yes    Comment: Once or twice a month   Drug use: No   Sexual activity: Yes    Birth control/protection: I.U.D.    Comment: Mirena IUD  Other Topics Concern   Not on file  Social History Narrative   Not on file   Social Determinants of Health   Financial Resource Strain: Not on file  Food Insecurity: Not on file  Transportation Needs: Not on file  Physical Activity: Not on file  Stress: Not on file  Social Connections: Not on file     Family History: The patient's family history includes Heart disease in her father. There is no history of Thyroid disease.  Father had bypass surgery in his late 61s.  ROS:   Please see the history of present illness.     All other systems reviewed and are negative.  EKGs/Labs/Other Studies Reviewed:    The following studies were reviewed today:   EKG:  EKG is ordered today.  The ekg ordered today demonstrates normal sinus rhythm, normal tracing  Recent Labs: 07/09/2020: Hemoglobin 13.3; Platelets 342 07/11/2020: TSH 1.17  Recent Lipid Panel    Component Value Date/Time   CHOL 151 04/11/2019 1052   TRIG 72 04/11/2019 1052   HDL 36 (L) 04/11/2019 1052   CHOLHDL 4.2 04/11/2019 1052   LDLCALC 101 (H) 04/11/2019 1052     Risk Assessment/Calculations:           Physical Exam:    VS:  BP 117/72   Pulse 75   Ht 5\' 5"  (1.651 m)   Wt 231 lb (104.8 kg)   SpO2 95%   BMI 38.44 kg/m     Wt Readings from Last 3 Encounters:  09/26/20 231 lb (104.8 kg)  07/11/20 243 lb (110.2 kg)  07/09/20 244 lb (110.7 kg)     GEN: Severely obese, well nourished, well developed in no acute distress HEENT: Normal NECK: No JVD; No carotid bruits LYMPHATICS: No lymphadenopathy CARDIAC: RRR, no murmurs, rubs, gallops.  She is tender to palpation across the left costochondral  joints. RESPIRATORY:  Clear to auscultation without rales, wheezing or rhonchi  ABDOMEN: Soft, non-tender, non-distended MUSCULOSKELETAL:  No edema; No deformity  SKIN: Warm and dry NEUROLOGIC:  Alert and oriented x 3 PSYCHIATRIC:  Normal affect   ASSESSMENT:    1. Precordial pain   2. Dyslipidemia    PLAN:    In order of problems listed  above:  Precordial pain: I am fairly confident that her symptoms are musculoskeletal.  They are reproducible and palpation.  It did not sound like angina pectoris.  I offered a treadmill stress test for further reassurance, but she did not think this was necessary.  OK to take occasional NSAIDs for symptom relief. Dyslipidemia: Iremarkably low HDL cholesterol for a young woman.  She has amenorrhea related to the presence of the IUD, not sure whether she also has hormonal/metabolic reasons for this such as PCOS which would correlate with her insulin resistance.  Discussed the role of exercise to improve insulin resistance and healthy diet options.       Medication Adjustments/Labs and Tests Ordered: Current medicines are reviewed at length with the patient today.  Concerns regarding medicines are outlined above.  Orders Placed This Encounter  Procedures   EKG 12-Lead   No orders of the defined types were placed in this encounter.   Patient Instructions  Medication Instructions:  No changes *If you need a refill on your cardiac medications before your next appointment, please call your pharmacy*   Lab Work: None ordered If you have labs (blood work) drawn today and your tests are completely normal, you will receive your results only by: [place] (if you have MyChart) OR A paper copy in the mail If you have any lab test that is abnormal or we need to change your treatment, we will call you to review the results.   Testing/Procedures: None ordered   Follow-Up: At [facility], you and your health needs are our priority.  As  part of our continuing mission to provide you with exceptional heart care, we have created designated Provider Care Teams.  These Care Teams include your primary Cardiologist (physician) and Advanced Practice Providers (APPs -  Physician Assistants and Nurse Practitioners) who all work together to provide you with the care you need, when you need it.  We recommend signing up for the patient portal called "MyChart".  Sign up information is provided on this After Visit Summary.  MyChart is used to connect with patients for Virtual Visits (Telemedicine).  Patients are able to view lab/test results, encounter notes, upcoming appointments, etc.  Non-urgent messages can be sent to your provider as well.   To learn more about what you can do with MyChart, go to [email or web address].    Your next appointment:   Follow up as needed with Dr. Sallyanne Kuster   Signed, Sanda Klein, MD  09/26/2020 5:47 PM    [place]

## 2020-09-26 NOTE — Patient Instructions (Signed)
Medication Instructions:  No changes *If you need a refill on your cardiac medications before your next appointment, please call your pharmacy*   Lab Work: None ordered If you have labs (blood work) drawn today and your tests are completely normal, you will receive your results only by: [place] (if you have MyChart) OR A paper copy in the mail If you have any lab test that is abnormal or we need to change your treatment, we will call you to review the results.   Testing/Procedures: None ordered   Follow-Up: At [facility], you and your health needs are our priority.  As part of our continuing mission to provide you with exceptional heart care, we have created designated Provider Care Teams.  These Care Teams include your primary Cardiologist (physician) and Advanced Practice Providers (APPs -  Physician Assistants and Nurse Practitioners) who all work together to provide you with the care you need, when you need it.  We recommend signing up for the patient portal called "MyChart".  Sign up information is provided on this After Visit Summary.  MyChart is used to connect with patients for Virtual Visits (Telemedicine).  Patients are able to view lab/test results, encounter notes, upcoming appointments, etc.  Non-urgent messages can be sent to your provider as well.   To learn more about what you can do with MyChart, go to [email or web address].    Your next appointment:   Follow up as needed with Dr. Sallyanne Kuster

## 2020-09-30 ENCOUNTER — Ambulatory Visit: Payer: BC Managed Care – PPO | Admitting: Nurse Practitioner

## 2020-09-30 ENCOUNTER — Other Ambulatory Visit: Payer: Self-pay

## 2020-09-30 VITALS — BP 110/70 | HR 75 | Temp 98.4°F | Ht 65.0 in | Wt 233.0 lb

## 2020-09-30 DIAGNOSIS — E88819 Insulin resistance, unspecified: Secondary | ICD-10-CM | POA: Insufficient documentation

## 2020-09-30 DIAGNOSIS — Z2821 Immunization not carried out because of patient refusal: Secondary | ICD-10-CM | POA: Diagnosis not present

## 2020-09-30 DIAGNOSIS — Z6838 Body mass index (BMI) 38.0-38.9, adult: Secondary | ICD-10-CM

## 2020-09-30 DIAGNOSIS — E8881 Metabolic syndrome: Secondary | ICD-10-CM

## 2020-09-30 MED ORDER — METFORMIN HCL 500 MG PO TABS: 500.0000 mg | ORAL_TABLET | Freq: Every evening | ORAL | 1 refills | Status: DC

## 2020-09-30 MED ORDER — RYBELSUS 7 MG PO TABS: 1.0000 | ORAL_TABLET | Freq: Every day | ORAL | 0 refills | Status: DC

## 2020-09-30 MED ORDER — METFORMIN HCL 500 MG PO TABS: 500.0000 mg | ORAL_TABLET | Freq: Two times a day (BID) | ORAL | 11 refills | Status: DC

## 2020-09-30 NOTE — Progress Notes (Signed)
I,Magdelena Kinsella,acting as a Education administrator for Minette Brine, FNP.,have documented all relevant documentation on the behalf of Minette Brine, FNP,as directed by  Minette Brine, FNP while in the presence of Minette Brine, [place].  This visit occurred during the SARS-CoV-2 public health emergency.  Safety protocols were in place, including screening questions prior to the visit, additional usage of staff PPE, and extensive cleaning of exam room while observing appropriate contact time as indicated for disinfecting solutions.  Subjective:     Patient ID: [name] [name] , female    DOB: 1986/02/20 , 35 y.o.   MRN: [id number]   Chief Complaint  Patient presents with   Obesity    HPI  Pt presents today for a med follow up. She as no complaints or concerns at the moment. Taking med as directed.  She is tolerating the Rybelsus overall well, she has noticed she is not eating as much. This past week it has been more difficulty with staying on target with her food.  She did notice last month she began eating more junk food. She has continued with samples of rybelsus and her insurance has not been covering the medication  She seen Cardiology last week who did not think her chest pain was heart related.   Wt Readings from Last 3 Encounters: 09/30/20 : 233 lb (105.7 kg) 09/26/20 : 231 lb (104.8 kg) 07/11/20 : 243 lb (110.2 kg)      Past Medical History:  Diagnosis Date   Allergic rhinitis    Bronchitis    Dyspnea    3 times a week   Headache    Hyperthyroidism    Neuromuscular disorder (HCC)    carpel tunnel left hand   Pre-diabetes    SVD (spontaneous vaginal delivery) 2004   x 1     Family History  Problem Relation Age of Onset   Heart disease Father    Thyroid disease Neg Hx      Current Outpatient Medications:    albuterol (PROVENTIL HFA;VENTOLIN HFA) 108 (90 Base) MCG/ACT inhaler, Inhale 1-2 puffs into the lungs every 6 (six) hours as needed for wheezing or shortness of breath., Disp: 1  Inhaler, Rfl: 0   albuterol (PROVENTIL) (2.5 MG/3ML) 0.083% nebulizer solution, Take 3 mLs (2.5 mg total) by nebulization every 6 (six) hours as needed for wheezing or shortness of breath., Disp: 75 mL, Rfl: 0   cyclobenzaprine (FLEXERIL) 10 MG tablet, Take 1 tablet (10 mg total) by mouth 2 (two) times daily as needed for muscle spasms., Disp: 10 tablet, Rfl: 0   fluticasone (FLONASE) 50 MCG/ACT nasal spray, Place 2 sprays into both nostrils daily., Disp: 1 g, Rfl: 0   ibuprofen (ADVIL,MOTRIN) 800 MG tablet, Take 1 tablet (800 mg total) by mouth every 8 (eight) hours as needed (mild pain)., Disp: 30 tablet, Rfl: 0   levonorgestrel (MIRENA) 20 MCG/24HR IUD, 1 each by Intrauterine route once., Disp: , Rfl:    methimazole (TAPAZOLE) 5 MG tablet, TAKE 1 TABLET BY MOUTH 3 TIMES A WEEK, Disp: 13 tablet, Rfl: 5   Polyethyl Glycol-Propyl Glycol 0.4-0.3 % SOLN, Place 1 drop into both eyes 3 (three) times daily as needed (dry eyes.)., Disp: , Rfl:    Vitamin D, Ergocalciferol, (DRISDOL) 1.25 MG (50000 UNIT) CAPS capsule, Take 1 capsule (50,000 Units total) by mouth 2 (two) times a week., Disp: 24 capsule, Rfl: 1   metFORMIN (GLUCOPHAGE) 500 MG tablet, Take 1 tablet (500 mg total) by mouth every evening., Disp: 90 tablet,  Rfl: 1   montelukast (SINGULAIR) 10 MG tablet, Take 1 tablet (10 mg total) by mouth at bedtime. (Patient not taking: Reported on 09/30/2020), Disp: 30 tablet, Rfl: 0   Semaglutide (RYBELSUS) 7 MG TABS, Take 1 tablet by mouth daily., Disp: 90 tablet, Rfl: 0   No Known Allergies   Review of Systems  Constitutional: Negative.   Respiratory: Negative.  Negative for wheezing.   Cardiovascular: Negative.   Neurological: Negative.   Psychiatric/Behavioral: Negative.      Today's Vitals   09/30/20 1406  BP: 110/70  Pulse: 75  Temp: 98.4 F (36.9 C)  Weight: 233 lb (105.7 kg)  Height: 5\' 5"  (1.651 m)   Body mass index is 38.77 kg/m.  Wt Readings from Last 3 Encounters:  09/30/20 233 lb  (105.7 kg)  09/26/20 231 lb (104.8 kg)  07/11/20 243 lb (110.2 kg)    Objective:  Physical Exam Vitals reviewed.  Constitutional:      General: She is not in acute distress.    Appearance: Normal appearance. She is obese.  Cardiovascular:     Rate and Rhythm: Normal rate and regular rhythm.     Pulses: Normal pulses.     Heart sounds: Normal heart sounds. No murmur heard. Pulmonary:     Effort: Pulmonary effort is normal. No respiratory distress.     Breath sounds: Normal breath sounds. No wheezing.  Skin:    Comments: Slightly hyperpigmented lips to lower lip  Neurological:     General: No focal deficit present.     Mental Status: She is alert and oriented to person, place, and time.     Cranial Nerves: No cranial nerve deficit.  Psychiatric:        Mood and Affect: Mood normal.        Behavior: Behavior normal.        Thought Content: Thought content normal.        Judgment: Judgment normal.        Assessment And Plan:     1. Insulin resistance She is to start metformin as we are having a hard time getting her rybelsus covered Will check HgbA1c  - Hemoglobin A1c - metFORMIN (GLUCOPHAGE) 500 MG tablet; Take 1 tablet (500 mg total) by mouth every evening.  Dispense: 90 tablet; Refill: 1  2. Class 2 severe obesity due to excess calories with serious comorbidity and body mass index (BMI) of 38.0 to 38.9 in adult [facility]) Chronic Discussed healthy diet and regular exercise options  Encouraged to exercise at least 150 minutes per week with 2 days of strength training  3. COVID-19 vaccination declined  Declines covid 19 vaccine. Discussed risk of covid 69 and if she changes her mind about the vaccine to call the office.  Encouraged to take multivitamin, vitamin d, vitamin c and zinc to increase immune system. Aware can call office if would like to have vaccine here at office.    Patient was given opportunity to ask questions. Patient verbalized understanding of the plan and  was able to repeat key elements of the plan. All questions were answered to their satisfaction.  Minette Brine, FNP   I, Minette Brine, FNP, have reviewed all documentation for this visit. The documentation on 09/30/20 for the exam, diagnosis, procedures, and orders are all accurate and complete.   IF YOU HAVE BEEN REFERRED TO A SPECIALIST, IT MAY TAKE 1-2 WEEKS TO SCHEDULE/PROCESS THE REFERRAL. IF YOU HAVE NOT HEARD FROM US/SPECIALIST IN TWO WEEKS, PLEASE GIVE [name]  A CALL AT [phone].   THE PATIENT IS ENCOURAGED TO PRACTICE SOCIAL DISTANCING DUE TO THE COVID-19 PANDEMIC.

## 2020-09-30 NOTE — Patient Instructions (Signed)
Exercising to Lose Weight Exercise is structured, repetitive physical activity to improve fitness and health. Getting regular exercise is important for everyone. It is especially important if you are overweight. Being overweight increases your risk of heart disease, stroke, diabetes, high blood pressure, and several types of cancer.Reducing your calorie intake and exercising can help you lose weight. Exercise is usually categorized as moderate or vigorous intensity. To lose weight, most people need to do a certain amount of moderate-intensity orvigorous-intensity exercise each week. Moderate-intensity exercise  Moderate-intensity exercise is any activity that gets you moving enough to burn at least three times more energy (calories) than if you were sitting. Examples of moderate exercise include: Walking a mile in 15 minutes. Doing light yard work. Biking at an easy pace. Most people should get at least 150 minutes (2 hours and 30 minutes) a week ofmoderate-intensity exercise to maintain their body weight. Vigorous-intensity exercise Vigorous-intensity exercise is any activity that gets you moving enough to burn at least six times more calories than if you were sitting. When you exercise at this intensity, you should be working hard enough that you are not able tocarry on a conversation. Examples of vigorous exercise include: Running. Playing a team sport, such as football, basketball, and soccer. Jumping rope. Most people should get at least 75 minutes (1 hour and 15 minutes) a week ofvigorous-intensity exercise to maintain their body weight. How can exercise affect me? When you exercise enough to burn more calories than you eat, you lose weight. Exercise also reduces body fat and builds muscle. The more muscle you have, the more calories you burn. Exercise also: Improves mood. Reduces stress and tension. Improves your overall fitness, flexibility, and endurance. Increases bone strength. The  amount of exercise you need to lose weight depends on: Your age. The type of exercise. Any health conditions you have. Your overall physical ability. Talk to your health care provider about how much exercise you need and whattypes of activities are safe for you. What actions can I take to lose weight? Nutrition  Make changes to your diet as told by your health care provider or diet and nutrition specialist (dietitian). This may include: Eating fewer calories. Eating more protein. Eating less unhealthy fats. Eating a diet that includes fresh fruits and vegetables, whole grains, low-fat dairy products, and lean protein. Avoiding foods with added fat, salt, and sugar. Drink plenty of water while you exercise to prevent dehydration or heat stroke.  Activity Choose an activity that you enjoy and set realistic goals. Your health care provider can help you make an exercise plan that works for you. Exercise at a moderate or vigorous intensity most days of the week. The intensity of exercise may vary from person to person. You can tell how intense a workout is for you by paying attention to your breathing and heartbeat. Most people will notice their breathing and heartbeat get faster with more intense exercise. Do resistance training twice each week, such as: Push-ups. Sit-ups. Lifting weights. Using resistance bands. Getting short amounts of exercise can be just as helpful as long structured periods of exercise. If you have trouble finding time to exercise, try to include exercise in your daily routine. Get up, stretch, and walk around every 30 minutes throughout the day. Go for a walk during your lunch break. Park your car farther away from your destination. If you take public transportation, get off one stop early and walk the rest of the way. Make phone calls while standing up and   walking around. Take the stairs instead of elevators or escalators. Wear comfortable clothes and shoes with  good support. Do not exercise so much that you hurt yourself, feel dizzy, or get very short of breath. Where to find more information U.S. Department of Health and Human Services: www.hhs.gov Centers for Disease Control and Prevention (CDC): www.cdc.gov Contact a health care provider: Before starting a new exercise program. If you have questions or concerns about your weight. If you have a medical problem that keeps you from exercising. Get help right away if you have any of the following while exercising: Injury. Dizziness. Difficulty breathing or shortness of breath that does not go away when you stop exercising. Chest pain. Rapid heartbeat. Summary Being overweight increases your risk of heart disease, stroke, diabetes, high blood pressure, and several types of cancer. Losing weight happens when you burn more calories than you eat. Reducing the amount of calories you eat in addition to getting regular moderate or vigorous exercise each week helps you lose weight. This information is not intended to replace advice given to you by your health care provider. Make sure you discuss any questions you have with your healthcare provider. Document Revised: 06/12/2019 Document Reviewed: 06/29/2019 Elsevier Patient Education  2022 Elsevier Inc.  

## 2020-10-01 ENCOUNTER — Other Ambulatory Visit: Payer: Self-pay | Admitting: Nurse Practitioner

## 2020-10-01 DIAGNOSIS — R7309 Other abnormal glucose: Secondary | ICD-10-CM

## 2020-10-01 LAB — HEMOGLOBIN A1C
Est. average glucose Bld gHb Est-mCnc: 117 mg/dL
Hgb A1c MFr Bld: 5.7 % — ABNORMAL HIGH (ref 4.8–5.6)

## 2020-10-01 MED ORDER — RYBELSUS 7 MG PO TABS: 1.0000 | ORAL_TABLET | Freq: Every day | ORAL | 0 refills | Status: DC

## 2020-10-04 ENCOUNTER — Encounter: Payer: Self-pay | Admitting: Nurse Practitioner

## 2020-10-09 ENCOUNTER — Telehealth: Payer: Self-pay | Admitting: Endocrinology

## 2020-10-09 DIAGNOSIS — R509 Fever, unspecified: Secondary | ICD-10-CM | POA: Diagnosis not present

## 2020-10-09 DIAGNOSIS — U071 COVID-19: Secondary | ICD-10-CM | POA: Diagnosis not present

## 2020-10-09 NOTE — Telephone Encounter (Signed)
Pt has misplaced her medication and was wondering if Dr.Ellison would send in a new prescription for her   methimazole (TAPAZOLE) 5 MG tablet   [facility] [place], [place] - [address] AT [address]

## 2020-10-10 ENCOUNTER — Other Ambulatory Visit: Payer: Self-pay

## 2020-10-10 DIAGNOSIS — E0590 Thyrotoxicosis, unspecified without thyrotoxic crisis or storm: Secondary | ICD-10-CM

## 2020-10-10 MED ORDER — METHIMAZOLE 5 MG PO TABS: ORAL_TABLET | ORAL | 5 refills | Status: DC

## 2020-10-19 ENCOUNTER — Other Ambulatory Visit: Payer: Self-pay

## 2020-10-19 ENCOUNTER — Encounter: Payer: Self-pay | Admitting: *Deleted

## 2020-10-19 ENCOUNTER — Ambulatory Visit
Admission: EM | Admit: 2020-10-19 | Discharge: 2020-10-19 | Disposition: A | Discharge status: Home / Self Care | Payer: BC Managed Care – PPO | Attending: Urgent Care | Admitting: Urgent Care

## 2020-10-19 DIAGNOSIS — U071 COVID-19: Secondary | ICD-10-CM

## 2020-10-19 DIAGNOSIS — Z8616 Personal history of COVID-19: Secondary | ICD-10-CM | POA: Diagnosis not present

## 2020-10-19 DIAGNOSIS — Z6836 Body mass index (BMI) 36.0-36.9, adult: Secondary | ICD-10-CM | POA: Diagnosis not present

## 2020-10-19 DIAGNOSIS — Z20822 Contact with and (suspected) exposure to covid-19: Secondary | ICD-10-CM | POA: Diagnosis not present

## 2020-10-19 NOTE — ED Provider Notes (Signed)
[place]   MRN: [id number] DOB: Jun 19, 1985  Subjective:   [name] [name] is a 35 y.o. female presenting for repeat COVID testing.  Patient tested positive for COVID-19 on 10/14/2020.  She is primarily here for retest.  Still has a cough and some congestion.  No chest pain or shortness of breath.  No current facility-administered medications for this encounter.  Current Outpatient Medications:    albuterol (PROVENTIL HFA;VENTOLIN HFA) 108 (90 Base) MCG/ACT inhaler, Inhale 1-2 puffs into the lungs every 6 (six) hours as needed for wheezing or shortness of breath., Disp: 1 Inhaler, Rfl: 0   albuterol (PROVENTIL) (2.5 MG/3ML) 0.083% nebulizer solution, Take 3 mLs (2.5 mg total) by nebulization every 6 (six) hours as needed for wheezing or shortness of breath., Disp: 75 mL, Rfl: 0   cyclobenzaprine (FLEXERIL) 10 MG tablet, Take 1 tablet (10 mg total) by mouth 2 (two) times daily as needed for muscle spasms., Disp: 10 tablet, Rfl: 0   fluticasone (FLONASE) 50 MCG/ACT nasal spray, Place 2 sprays into both nostrils daily., Disp: 1 g, Rfl: 0   ibuprofen (ADVIL,MOTRIN) 800 MG tablet, Take 1 tablet (800 mg total) by mouth every 8 (eight) hours as needed (mild pain)., Disp: 30 tablet, Rfl: 0   levonorgestrel (MIRENA) 20 MCG/24HR IUD, 1 each by Intrauterine route once., Disp: , Rfl:    metFORMIN (GLUCOPHAGE) 500 MG tablet, Take 1 tablet (500 mg total) by mouth every evening., Disp: 90 tablet, Rfl: 1   methimazole (TAPAZOLE) 5 MG tablet, TAKE 1 TABLET BY MOUTH 3 TIMES A WEEK, Disp: 13 tablet, Rfl: 5   montelukast (SINGULAIR) 10 MG tablet, Take 1 tablet (10 mg total) by mouth at bedtime. (Patient not taking: Reported on 09/30/2020), Disp: 30 tablet, Rfl: 0   Polyethyl Glycol-Propyl Glycol 0.4-0.3 % SOLN, Place 1 drop into both eyes 3 (three) times daily as needed (dry eyes.)., Disp: , Rfl:    Semaglutide (RYBELSUS) 7 MG TABS, Take 1 tablet by mouth daily., Disp: 90 tablet, Rfl: 0    Vitamin D, Ergocalciferol, (DRISDOL) 1.25 MG (50000 UNIT) CAPS capsule, Take 1 capsule (50,000 Units total) by mouth 2 (two) times a week., Disp: 24 capsule, Rfl: 1   No Known Allergies  Past Medical History:  Diagnosis Date   Allergic rhinitis    Bronchitis    Dyspnea    3 times a week   Headache    Hyperthyroidism    Neuromuscular disorder ([place])    carpel tunnel left hand   Pre-diabetes    SVD (spontaneous vaginal delivery) 2004   x 1     Past Surgical History:  Procedure Laterality Date   DILATION AND CURETTAGE OF UTERUS     termination x 1   MYOMECTOMY N/A 12/01/2017   Procedure: MYOMECTOMY;  Surgeon: Christophe Louis, MD;  Location: [facility];  Service: Gynecology;  Laterality: N/A;   WISDOM TOOTH EXTRACTION      Family History  Problem Relation Age of Onset   Heart disease Father    Thyroid disease Neg Hx     Social History   Tobacco Use   Smoking status: Former    Packs/day: 0.25    Years: 17.00    Pack years: 4.25    Types: Cigars, Cigarettes    Quit date: 10/31/2017    Years since quitting: 2.9   Smokeless tobacco: Never   Tobacco comments:    Not smoked since 10/2017  Vaping Use   Vaping Use: Never used  Substance  Use Topics   Alcohol use: Yes    Comment: Once or twice a month   Drug use: No    ROS   Objective:   Vitals: BP 97/88   Pulse 88   Temp 98.2 F (36.8 C)   Resp 20   SpO2 95%   Physical Exam Constitutional:      General: She is not in acute distress.    Appearance: Normal appearance. She is well-developed. She is not ill-appearing, toxic-appearing or diaphoretic.  HENT:     Head: Normocephalic and atraumatic.     Nose: Nose normal.     Mouth/Throat:     Mouth: Mucous membranes are moist.     Pharynx: Oropharynx is clear.  Eyes:     General: No scleral icterus.       Right eye: No discharge.        Left eye: No discharge.     Extraocular Movements: Extraocular movements intact.     Conjunctiva/sclera: Conjunctivae normal.      Pupils: Pupils are equal, round, and reactive to light.  Cardiovascular:     Rate and Rhythm: Normal rate.  Pulmonary:     Effort: Pulmonary effort is normal.  Skin:    General: Skin is warm and dry.  Neurological:     General: No focal deficit present.     Mental Status: She is alert and oriented to person, place, and time.  Psychiatric:        Mood and Affect: Mood normal.        Behavior: Behavior normal.        Thought Content: Thought content normal.        Judgment: Judgment normal.     Assessment and Plan :   PDMP not reviewed this encounter.  1. COVID-19 virus infection     Discussed with patient that her COVID test is likely to be positive for up to 2 months.  I offered to examine patient but she refused.  States that she is primarily here to get a COVID test that she would like to see a negative result.  Reiterated to patient that her test to be positive for up to 2 months and does not indicate that she is contagious or still with an active infection.  Patient verbalizes understanding and insist on the repeat test. I obliged her, testing pending.    Jaynee Eagles, PA-C 10/19/20 1051

## 2020-10-19 NOTE — ED Triage Notes (Signed)
Pt presents today for repeat COVID test to return to work.

## 2020-10-21 LAB — SARS-COV-2, NAA 2 DAY TAT

## 2020-10-21 LAB — NOVEL CORONAVIRUS, NAA: SARS-CoV-2, NAA: DETECTED — AB

## 2020-10-22 ENCOUNTER — Telehealth: Payer: Self-pay | Admitting: Endocrinology

## 2020-10-22 NOTE — Telephone Encounter (Signed)
Scheduled patient on 11/12/2020 for concerns about eye. Pt would like a response once Dr. Loanne Drilling is back.

## 2020-10-22 NOTE — Telephone Encounter (Signed)
Pt is having trouble with her eyes. One eye looks like it is "bulging" (left eye). Pt would like to see if its related to her thyroid. Pt would like a call back

## 2020-10-23 ENCOUNTER — Other Ambulatory Visit: Payer: Self-pay | Admitting: Endocrinology

## 2020-10-23 DIAGNOSIS — E0500 Thyrotoxicosis with diffuse goiter without thyrotoxic crisis or storm: Secondary | ICD-10-CM | POA: Insufficient documentation

## 2020-11-05 DIAGNOSIS — E0500 Thyrotoxicosis with diffuse goiter without thyrotoxic crisis or storm: Secondary | ICD-10-CM | POA: Diagnosis not present

## 2020-11-05 DIAGNOSIS — H02421 Myogenic ptosis of right eyelid: Secondary | ICD-10-CM | POA: Diagnosis not present

## 2020-11-12 ENCOUNTER — Ambulatory Visit: Payer: BC Managed Care – PPO | Admitting: Endocrinology

## 2020-11-13 ENCOUNTER — Ambulatory Visit (INDEPENDENT_AMBULATORY_CARE_PROVIDER_SITE_OTHER): Payer: BC Managed Care – PPO | Admitting: Endocrinology

## 2020-11-13 ENCOUNTER — Other Ambulatory Visit: Payer: Self-pay

## 2020-11-13 DIAGNOSIS — E0590 Thyrotoxicosis, unspecified without thyrotoxic crisis or storm: Secondary | ICD-10-CM | POA: Diagnosis not present

## 2020-11-13 DIAGNOSIS — H02409 Unspecified ptosis of unspecified eyelid: Secondary | ICD-10-CM

## 2020-11-13 LAB — T4, FREE: Free T4: 0.89 ng/dL (ref 0.60–1.60)

## 2020-11-13 LAB — TSH: TSH: 1.55 u[IU]/mL (ref 0.35–5.50)

## 2020-11-13 MED ORDER — METHIMAZOLE 5 MG PO TABS: ORAL_TABLET | ORAL | 3 refills | Status: DC

## 2020-11-13 NOTE — Progress Notes (Signed)
Subjective:    Patient ID: [name] [name], female    DOB: May 25, 1985, 35 y.o.   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  pt states she feels well in general.  Pt says she missed the methimazole x a few weeks, 7/22.   Past Medical History:  Diagnosis Date   Allergic rhinitis    Bronchitis    Dyspnea    3 times a week   Headache    Hyperthyroidism    Neuromuscular disorder ([place])    carpel tunnel left hand   Pre-diabetes    SVD (spontaneous vaginal delivery) 2004   x 1    Past Surgical History:  Procedure Laterality Date   DILATION AND CURETTAGE OF UTERUS     termination x 1   MYOMECTOMY N/A 12/01/2017   Procedure: MYOMECTOMY;  Surgeon: Christophe Louis, MD;  Location: [facility];  Service: Gynecology;  Laterality: N/A;   WISDOM TOOTH EXTRACTION      Social History   Socioeconomic History   Marital status: Married    Spouse name: Barbaraann Rondo   Number of children: 1   Years of education: Not on file   Highest education level: Not on file  Occupational History   Not on file  Tobacco Use   Smoking status: Former    Packs/day: 0.25    Years: 17.00    Pack years: 4.25    Types: Cigars, Cigarettes    Quit date: 10/31/2017    Years since quitting: 3.0   Smokeless tobacco: Never   Tobacco comments:    Not smoked since 10/2017  Vaping Use   Vaping Use: Never used  Substance and Sexual Activity   Alcohol use: Yes    Comment: Once or twice a month   Drug use: No   Sexual activity: Yes    Birth control/protection: I.U.D.    Comment: Mirena IUD  Other Topics Concern   Not on file  Social History Narrative   Not on file   Social Determinants of Health   Financial Resource Strain: Not on file  Food Insecurity: Not on file  Transportation Needs: Not on  file  Physical Activity: Not on file  Stress: Not on file  Social Connections: Not on file  Intimate Partner Violence: Not on file    Current Outpatient Medications on File Prior to Visit  Medication Sig Dispense Refill   albuterol (PROVENTIL HFA;VENTOLIN HFA) 108 (90 Base) MCG/ACT inhaler Inhale 1-2 puffs into the lungs every 6 (six) hours as needed for wheezing or shortness of breath. 1 Inhaler 0   albuterol (PROVENTIL) (2.5 MG/3ML) 0.083% nebulizer solution Take 3 mLs (2.5 mg total) by nebulization every 6 (six) hours as needed for wheezing or shortness of breath. 75 mL 0   cyclobenzaprine (FLEXERIL) 10 MG tablet Take 1 tablet (10 mg total) by mouth 2 (two) times daily as needed for muscle spasms. 10 tablet 0   fluticasone (FLONASE) 50 MCG/ACT nasal spray Place 2 sprays into both nostrils daily. 1 g 0   ibuprofen (ADVIL,MOTRIN) 800 MG tablet Take 1 tablet (800 mg total) by mouth every 8 (eight) hours as needed (mild pain). 30 tablet 0   levonorgestrel (MIRENA) 20 MCG/24HR IUD 1 each by Intrauterine route once.     metFORMIN (GLUCOPHAGE) 500 MG  tablet Take 1 tablet (500 mg total) by mouth every evening. 90 tablet 1   montelukast (SINGULAIR) 10 MG tablet Take 1 tablet (10 mg total) by mouth at bedtime. 30 tablet 0   Polyethyl Glycol-Propyl Glycol 0.4-0.3 % SOLN Place 1 drop into both eyes 3 (three) times daily as needed (dry eyes.).     Semaglutide (RYBELSUS) 7 MG TABS Take 1 tablet by mouth daily. 90 tablet 0   Vitamin D, Ergocalciferol, (DRISDOL) 1.25 MG (50000 UNIT) CAPS capsule Take 1 capsule (50,000 Units total) by mouth 2 (two) times a week. 24 capsule 1   No current facility-administered medications on file prior to visit.    No Known Allergies  Family History  Problem Relation Age of Onset   Heart disease Father    Thyroid disease Neg Hx     BP 114/78 (BP Location: Right Arm, Patient Position: Sitting, Cuff Size: Large)   Pulse 67   Ht '5\' 5"'$  (1.651 m)   Wt 226 lb 3.2 oz  (102.6 kg)   SpO2 97%   BMI 37.64 kg/m    Review of Systems Denies fever.  She says right eyelid droops.  No other muscle weakness.      Objective:   Physical Exam VITAL SIGNS:  See vs page GENERAL: no distress Eyes: slight ptosis bilat.   NECK: thyroid is slightly and diffusely enlarged.    Lab Results  Component Value Date   TSH 1.55 11/13/2020      Assessment & Plan:  Hyperthyroidism: well-controlled.  Please continue the same tapazole.

## 2020-11-13 NOTE — Patient Instructions (Addendum)
Blood tests are requested for you today.  We'll let you know about the results.  If ever you have fever while taking methimazole, stop it and call us, even if the reason is obvious, because of the risk of a rare side-effect. It is best to never miss the medication.  However, if you do miss it, next best is to double up the next time.   Please see a neurology specialist.  you will receive a phone call, about a day and time for an appointment.   Please come back for a follow-up appointment in 6 months.

## 2021-01-07 ENCOUNTER — Encounter: Payer: Self-pay | Admitting: Neurology

## 2021-01-10 ENCOUNTER — Ambulatory Visit: Payer: BC Managed Care – PPO | Admitting: Endocrinology

## 2021-02-05 DIAGNOSIS — Z1159 Encounter for screening for other viral diseases: Secondary | ICD-10-CM | POA: Diagnosis not present

## 2021-02-05 DIAGNOSIS — J302 Other seasonal allergic rhinitis: Secondary | ICD-10-CM | POA: Diagnosis not present

## 2021-02-05 DIAGNOSIS — R030 Elevated blood-pressure reading, without diagnosis of hypertension: Secondary | ICD-10-CM | POA: Diagnosis not present

## 2021-02-26 ENCOUNTER — Encounter: Payer: Self-pay | Admitting: Nurse Practitioner

## 2021-03-11 ENCOUNTER — Ambulatory Visit: Payer: BC Managed Care – PPO | Admitting: Nurse Practitioner

## 2021-03-12 ENCOUNTER — Ambulatory Visit: Payer: BC Managed Care – PPO | Admitting: Nurse Practitioner

## 2021-03-13 ENCOUNTER — Other Ambulatory Visit: Payer: Self-pay

## 2021-03-13 ENCOUNTER — Ambulatory Visit: Payer: BC Managed Care – PPO | Admitting: Nurse Practitioner

## 2021-03-13 VITALS — BP 126/68 | HR 89 | Temp 98.6°F | Ht 65.8 in | Wt 235.6 lb

## 2021-03-13 DIAGNOSIS — Z6838 Body mass index (BMI) 38.0-38.9, adult: Secondary | ICD-10-CM

## 2021-03-13 DIAGNOSIS — R7303 Prediabetes: Secondary | ICD-10-CM | POA: Diagnosis not present

## 2021-03-13 MED ORDER — SAXENDA 18 MG/3ML ~~LOC~~ SOPN: PEN_INJECTOR | SUBCUTANEOUS | 1 refills | Status: DC

## 2021-03-13 NOTE — Patient Instructions (Signed)
Obesity, Adult Obesity is having too much body fat. Being obese means that your weight is more than what is healthy for you.  BMI (body mass index) is a number that explains how much body fat you have. If you have a BMI of 30 or more, you are obese. Obesity can cause serious health problems, such as: Stroke. Coronary artery disease (CAD). Type 2 diabetes. Some types of cancer. High blood pressure (hypertension). High cholesterol. Gallbladder stones. Obesity can also contribute to: Osteoarthritis. Sleep apnea. Infertility problems. What are the causes? Eating meals each day that are high in calories, sugar, and fat. Drinking a lot of drinks that have sugar in them. Being born with genes that may make you more likely to become obese. Having a medical condition that causes obesity. Taking certain medicines. Sitting a lot (having a sedentary lifestyle). Not getting enough sleep. What increases the risk? Having a family history of obesity. Living in an area with limited access to: [place], recreation centers, or sidewalks. Healthy food choices, such as grocery stores and farmers' markets. What are the signs or symptoms? The main sign is having too much body fat. How is this treated? Treatment for this condition often includes changing your lifestyle. Treatment may include: Changing your diet. This may include making a healthy meal plan. Exercise. This may include activity that causes your heart to beat faster (aerobic exercise) and strength training. Work with your doctor to design a program that works for you. Medicine to help you lose weight. This may be used if you are not able to lose one pound a week after 6 weeks of healthy eating and more exercise. Treating conditions that cause the obesity. Surgery. Options may include gastric banding and gastric bypass. This may be done if: Other treatments have not helped to improve your condition. You have a BMI of 40 or higher. You have  life-threatening health problems related to obesity. Follow these instructions at home: Eating and drinking  Follow advice from your doctor about what to eat and drink. Your doctor may tell you to: Limit fast food, sweets, and processed snack foods. Choose low-fat options. For example, choose low-fat milk instead of whole milk. Eat five or more servings of fruits or vegetables each day. Eat at home more often. This gives you more control over what you eat. Choose healthy foods when you eat out. Learn to read food labels. This will help you learn how much food is in one serving. Keep low-fat snacks available. Avoid drinks that have a lot of sugar in them. These include soda, fruit juice, iced tea with sugar, and flavored milk. Drink enough water to keep your pee (urine) pale yellow. Do not go on fad diets. Physical activity Exercise often, as told by your doctor. Most adults should get up to 150 minutes of moderate-intensity exercise every week.Ask your doctor: What types of exercise are safe for you. How often you should exercise. Warm up and stretch before being active. Do slow stretching after being active (cool down). Rest between times of being active. Lifestyle Work with your doctor and a food expert (dietitian) to set a weight-loss goal that is best for you. Limit your screen time. Find ways to reward yourself that do not involve food. Do not drink alcohol if: Your doctor tells you not to drink. You are pregnant, may be pregnant, or are planning to become pregnant. If you drink alcohol: Limit how much you have to: 0-1 drink a day for women. 0-2 drinks  a day for men. Know how much alcohol is in your drink. In the [place] one drink equals one 12 oz bottle of beer (355 mL), one 5 oz glass of wine (148 mL), or one 1 oz glass of hard liquor (44 mL). General instructions Keep a weight-loss journal. This can help you keep track of: The food that you eat. How much exercise you  get. Take over-the-counter and prescription medicines only as told by your doctor. Take vitamins and supplements only as told by your doctor. Think about joining a support group. Pay attention to your mental health as obesity can lead to depression or self esteem issues. Keep all follow-up visits. Contact a doctor if: You cannot meet your weight-loss goal after you have changed your diet and lifestyle for 6 weeks. You are having trouble breathing. Summary Obesity is having too much body fat. Being obese means that your weight is more than what is healthy for you. Work with your doctor to set a weight-loss goal. Get regular exercise as told by your doctor. This information is not intended to replace advice given to you by your health care provider. Make sure you discuss any questions you have with your health care provider. Document Revised: 10/08/2020 Document Reviewed: 10/08/2020 Elsevier Patient Education  [place].

## 2021-03-13 NOTE — Progress Notes (Signed)
I,Tianna Badgett,acting as a Education administrator for [employer], NP.,have documented all relevant documentation on the behalf of [employer], NP,as directed by  Bary Castilla, NP while in the presence of Bary Castilla, NP.  This visit occurred during the SARS-CoV-2 public health emergency.  Safety protocols were in place, including screening questions prior to the visit, additional usage of staff PPE, and extensive cleaning of exam room while observing appropriate contact time as indicated for disinfecting solutions.  Subjective:     Patient ID: [name] [name] , female    DOB: 05/14/1985 , 35 y.o.   MRN: [id number]   Chief Complaint  Patient presents with   Weight Check    HPI  Patient is here for weight check. She has tried diets and other medications however her insurance does not cover. She is interested in taking saxenda.  She has tried rybelsus, keto dieting. Other diets, intermediae fasting. She does not get a lot of exercise.  Wt Readings from Last 3 Encounters: 03/13/21 : 235 lb 9.6 oz (106.9 kg) 11/13/20 : 226 lb 3.2 oz (102.6 kg) 09/30/20 : 233 lb (105.7 kg)       Past Medical History:  Diagnosis Date   Allergic rhinitis    Bronchitis    Dyspnea    3 times a week   Headache    Hyperthyroidism    Neuromuscular disorder (HCC)    carpel tunnel left hand   Pre-diabetes    SVD (spontaneous vaginal delivery) 2004   x 1     Family History  Problem Relation Age of Onset   Heart disease Father    Thyroid disease Neg Hx      Current Outpatient Medications:    Liraglutide -Weight Management (SAXENDA) 18 MG/3ML SOPN, Use as directed, Disp: 3 mL, Rfl: 1   albuterol (PROVENTIL HFA;VENTOLIN HFA) 108 (90 Base) MCG/ACT inhaler, Inhale 1-2 puffs into the lungs every 6 (six) hours as needed for wheezing or shortness of breath., Disp: 1 Inhaler, Rfl: 0   albuterol (PROVENTIL) (2.5 MG/3ML) 0.083% nebulizer solution, Take 3 mLs (2.5 mg total) by nebulization every 6  (six) hours as needed for wheezing or shortness of breath., Disp: 75 mL, Rfl: 0   cyclobenzaprine (FLEXERIL) 10 MG tablet, Take 1 tablet (10 mg total) by mouth 2 (two) times daily as needed for muscle spasms., Disp: 10 tablet, Rfl: 0   fluticasone (FLONASE) 50 MCG/ACT nasal spray, Place 2 sprays into both nostrils daily., Disp: 1 g, Rfl: 0   ibuprofen (ADVIL,MOTRIN) 800 MG tablet, Take 1 tablet (800 mg total) by mouth every 8 (eight) hours as needed (mild pain)., Disp: 30 tablet, Rfl: 0   levonorgestrel (MIRENA) 20 MCG/24HR IUD, 1 each by Intrauterine route once., Disp: , Rfl:    metFORMIN (GLUCOPHAGE) 500 MG tablet, Take 1 tablet (500 mg total) by mouth every evening., Disp: 90 tablet, Rfl: 1   methimazole (TAPAZOLE) 5 MG tablet, TAKE 1 TABLET BY MOUTH 3 TIMES A WEEK, Disp: 40 tablet, Rfl: 3   montelukast (SINGULAIR) 10 MG tablet, Take 1 tablet (10 mg total) by mouth at bedtime., Disp: 30 tablet, Rfl: 0   Polyethyl Glycol-Propyl Glycol 0.4-0.3 % SOLN, Place 1 drop into both eyes 3 (three) times daily as needed (dry eyes.)., Disp: , Rfl:    Vitamin D, Ergocalciferol, (DRISDOL) 1.25 MG (50000 UNIT) CAPS capsule, Take 1 capsule (50,000 Units total) by mouth 2 (two) times a week., Disp: 24 capsule, Rfl: 1   No Known Allergies  Review of Systems  Constitutional: Negative.  Negative for chills and fever.  Respiratory: Negative.  Negative for cough, shortness of breath and wheezing.   Cardiovascular: Negative.   Gastrointestinal: Negative.  Negative for constipation and diarrhea.  Neurological: Negative.  Negative for weakness and headaches.    Today's Vitals   03/13/21 1151  BP: 126/68  Pulse: 89  Temp: 98.6 F (37 C)  TempSrc: Oral  Weight: 235 lb 9.6 oz (106.9 kg)  Height: 5' 5.8" (1.671 m)   Body mass index is 38.26 kg/m.   Objective:  Physical Exam Constitutional:      Appearance: Normal appearance. She is obese.  HENT:     Head: Normocephalic and atraumatic.  Cardiovascular:      Rate and Rhythm: Normal rate and regular rhythm.     Pulses: Normal pulses.     Heart sounds: Normal heart sounds. No murmur heard. Pulmonary:     Effort: Pulmonary effort is normal. No respiratory distress.     Breath sounds: Normal breath sounds. No wheezing.  Skin:    General: Skin is warm and dry.     Capillary Refill: Capillary refill takes less than 2 seconds.  Neurological:     Mental Status: She is alert.        Assessment And Plan:     1. Prediabetes - Hemoglobin A1c  2. Class 2 severe obesity due to excess calories with serious comorbidity and body mass index (BMI) of 38.0 to 38.9 in adult (HCC) - Liraglutide -Weight Management (SAXENDA) 18 MG/3ML SOPN; Use as directed  Dispense: 3 mL; Refill: 1  -Pt. Denies any tyroid cancer in herself or in her family.  -We discussed the use of Saxenda to address obesity. She confirms there is no personal/family h/o thyroid cancer. She was instructed on how to administer the medication. Possible side effects including nausea, constipation and diarrhea were discussed with the patient. Its association with medullary thyroid carcinoma was also discussed. She is reminded to stop eating when full. All questions were answered to her satisfaction. She is in agreement with her treatment plan.   The patient was encouraged to call or send a message through [place] for any questions or concerns.   Follow up: if symptoms persist or do not get better.   Side effects and appropriate use of all the medication(s) were discussed with the patient today. Patient advised to use the medication(s) as directed by their healthcare provider. The patient was encouraged to read, review, and understand all associated package inserts and contact our office with any questions or concerns. The patient accepts the risks of the treatment plan and had an opportunity to ask questions.   Patient was given opportunity to ask questions. Patient verbalized understanding of  the plan and was able to repeat key elements of the plan. All questions were answered to their satisfaction.  Raman Raza Bayless, DNP   I, Raman Yiannis Tulloch have reviewed all documentation for this visit. The documentation on 03/13/21 for the exam, diagnosis, procedures, and orders are all accurate and complete.    IF YOU HAVE BEEN REFERRED TO A SPECIALIST, IT MAY TAKE 1-2 WEEKS TO SCHEDULE/PROCESS THE REFERRAL. IF YOU HAVE NOT HEARD FROM US/SPECIALIST IN TWO WEEKS, PLEASE GIVE [name] A CALL AT [phone].   THE PATIENT IS ENCOURAGED TO PRACTICE SOCIAL DISTANCING DUE TO THE COVID-19 PANDEMIC.

## 2021-03-14 LAB — HEMOGLOBIN A1C
Est. average glucose Bld gHb Est-mCnc: 120 mg/dL
Hgb A1c MFr Bld: 5.8 % — ABNORMAL HIGH (ref 4.8–5.6)

## 2021-03-26 ENCOUNTER — Ambulatory Visit: Payer: BC Managed Care – PPO | Admitting: Neurology

## 2021-03-26 ENCOUNTER — Other Ambulatory Visit (INDEPENDENT_AMBULATORY_CARE_PROVIDER_SITE_OTHER): Payer: BC Managed Care – PPO

## 2021-03-26 ENCOUNTER — Other Ambulatory Visit: Payer: Self-pay

## 2021-03-26 ENCOUNTER — Encounter: Payer: Self-pay | Admitting: Neurology

## 2021-03-26 VITALS — BP 120/76 | HR 84 | Ht 68.5 in | Wt 233.0 lb

## 2021-03-26 DIAGNOSIS — H02401 Unspecified ptosis of right eyelid: Secondary | ICD-10-CM

## 2021-03-26 DIAGNOSIS — H532 Diplopia: Secondary | ICD-10-CM | POA: Diagnosis not present

## 2021-03-26 NOTE — Progress Notes (Signed)
[facility] Clinic Note - Initial Visit   Date: 81/01/75  [name] [name] MRN: [id number] DOB: 14-Feb-1986   Dear Dr. Loanne Drilling:  Thank you for your kind referral of [name] for consultation of right eyelid ptosis. Although her history is well known to you, please allow [name] to reiterate it for the purpose of our medical record. The patient was accompanied to the clinic by self.    History of Present Illness: [name] [name] is a 36 y.o. right-handed female with prediabetes and hyperthyroidism presenting for evaluation of right eyelid ptosis.   Starting around August 2022, she began noticing that her right eyelid was a little droopy. She noticed it in a picture.  Symptoms are constant without any variation.  No change with temperature or activity.  No double vision, difficulty swallowing/talking, or weakness.  She has pain-related weakness over the right biceps and shoulder.    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 She lives at home with her husband, daughter is in college.   Out-side paper records, electronic medical record, and images have been reviewed where available and summarized as:  Lab Results  Component Value Date   HGBA1C 5.8 (H) 03/13/2021   Lab Results  Component Value Date   [id number] [id number] 07/09/2020   Lab Results  Component Value Date   TSH 1.55 11/13/2020    Past Medical History:  Diagnosis Date   Allergic rhinitis    Bronchitis    Dyspnea    3 times a week   Headache    Hyperthyroidism    Neuromuscular disorder ([place])    carpel tunnel left hand   Pre-diabetes    SVD (spontaneous vaginal delivery) 2004   x 1    Past Surgical History:  Procedure Laterality Date   DILATION AND CURETTAGE OF UTERUS     termination x 1   MYOMECTOMY N/A 12/01/2017   Procedure: MYOMECTOMY;  Surgeon: Christophe Louis, MD;  Location: [facility];  Service: Gynecology;  Laterality: N/A;   WISDOM TOOTH EXTRACTION       Medications:   Outpatient Encounter Medications as of 03/26/2021  Medication Sig   albuterol (PROVENTIL HFA;VENTOLIN HFA) 108 (90 Base) MCG/ACT inhaler Inhale 1-2 puffs into the lungs every 6 (six) hours as needed for wheezing or shortness of breath.   albuterol (PROVENTIL) (2.5 MG/3ML) 0.083% nebulizer solution Take 3 mLs (2.5 mg total) by nebulization every 6 (six) hours as needed for wheezing or shortness of breath.   cyclobenzaprine (FLEXERIL) 10 MG tablet Take 1 tablet (10 mg total) by mouth 2 (two) times daily as needed for muscle spasms.   fluticasone (FLONASE) 50 MCG/ACT nasal spray Place 2 sprays into both nostrils daily.   ibuprofen (ADVIL,MOTRIN) 800 MG tablet Take 1 tablet (800 mg total) by mouth every 8 (eight) hours as needed (mild pain).   levonorgestrel (MIRENA) 20 MCG/24HR IUD 1 each by Intrauterine route once.   Liraglutide -Weight Management (SAXENDA) 18 MG/3ML SOPN Use as directed   methimazole (TAPAZOLE) 5 MG tablet TAKE 1 TABLET BY MOUTH 3 TIMES A WEEK   montelukast (SINGULAIR) 10 MG tablet Take 1 tablet (10 mg total) by mouth at bedtime.   Vitamin D, Ergocalciferol, (DRISDOL) 1.25 MG (50000 UNIT) CAPS capsule Take 1 capsule (50,000 Units total) by mouth 2 (two) times a week.   [DISCONTINUED] metFORMIN (GLUCOPHAGE) 500 MG tablet Take 1 tablet (500 mg total) by mouth every evening. (Patient not taking: Reported on 03/26/2021)   [DISCONTINUED] Polyethyl  Glycol-Propyl Glycol 0.4-0.3 % SOLN Place 1 drop into both eyes 3 (three) times daily as needed (dry eyes.). (Patient not taking: Reported on 03/26/2021)   No facility-administered encounter medications on file as of 03/26/2021.    Allergies: No Known Allergies  Family History: Family History  Problem Relation Age of Onset   Heart disease Father    Thyroid disease Neg Hx     Social History: Social History   Tobacco Use   Smoking status: Former    Packs/day: 0.25    Years: 17.00    Pack years: 4.25    Types: Cigars, Cigarettes     Quit date: 10/31/2017    Years since quitting: 3.4   Smokeless tobacco: Never   Tobacco comments:    Not smoked since 10/2017  Vaping Use   Vaping Use: Never used  Substance Use Topics   Alcohol use: Yes    Comment: Once or twice a month   Drug use: No   Social History   Social History Narrative   Right Handed    Lives in an apartment on the second floor     Vital Signs:  BP 120/76    Pulse 84    Ht 5' 8.5" (1.74 m)    Wt 233 lb (105.7 kg)    SpO2 96%    BMI 34.91 kg/m    Neurological Exam: MENTAL STATUS including orientation to time, place, person, recent and remote memory, attention span and concentration, language, and fund of knowledge is normal.  Speech is not dysarthric.  CRANIAL NERVES: II:  No visual field defects.  Unremarkable fundi.   III-IV-VI: Pupils equal round and reactive to light.  Normal conjugate, extra-ocular eye movements in all directions of gaze.  No nystagmus.  Mild right ptosis at baseline without worsening with sustained upgaze, eyelid 44mm above the pupil. No ptosis on the left eye.   V:  Normal facial sensation.    VII:  Normal facial symmetry and movements.  Facial muscles are 5/5 throughout VIII:  Normal hearing and vestibular function.   IX-X:  Normal palatal movement.   XI:  Normal shoulder shrug and head rotation.   XII:  Normal tongue strength and range of motion, no deviation or fasciculation.  MOTOR:  Motor strength is 5/5 throughout. No atrophy, fasciculations or abnormal movements.  No pronator drift.   MSRs: Reflexes are 2+/4 throughout. Downgoing plantars bilaterally.  SENSORY:  Normal and symmetric perception of light touch, pinprick, vibration.  Romberg's sign absent.   COORDINATION/GAIT: Normal finger-to- nose-finger.  Intact rapid alternating movements bilaterally.  Gait narrow based and stable. Tandem and stressed gait intact.   IMPRESSION: Isolated right eyelid ptosis, very mild without diurnal variation.  No associated ocular,  bulbar or limb weakness.  Exam does not suggest CN III palsy or Horner syndrome.  Lack of symptoms variation also makes myasthenia unlikely. To be complete, she will have MRI brain wwo contrast and AChR antibodies checked. If this returns normal, this may be acquired aponeurotic ptosis.  Further recommendations pending results.  Thank you for allowing me to participate in patient's care.  If I can answer any additional questions, I would be pleased to do so.    Sincerely,    Lusero Nordlund K. Posey Pronto, DO

## 2021-03-26 NOTE — Patient Instructions (Signed)
We will check labs and MRI brain to further evaluation your symptoms.

## 2021-04-06 LAB — MYASTHENIA GRAVIS PANEL 1
A CHR BINDING ABS: <0.3 nmol/L
STRIATED MUSCLE AB SCREEN: NEGATIVE

## 2021-04-08 ENCOUNTER — Other Ambulatory Visit: Payer: BC Managed Care – PPO

## 2021-04-17 ENCOUNTER — Other Ambulatory Visit: Payer: Self-pay

## 2021-04-17 ENCOUNTER — Ambulatory Visit
Admission: RE | Admit: 2021-04-17 | Discharge: 2021-04-17 | Disposition: A | Discharge status: Home / Self Care | Payer: BC Managed Care – PPO | Source: Ambulatory Visit | Attending: Neurology | Admitting: Neurology

## 2021-04-17 DIAGNOSIS — H02401 Unspecified ptosis of right eyelid: Secondary | ICD-10-CM

## 2021-04-17 DIAGNOSIS — H02409 Unspecified ptosis of unspecified eyelid: Secondary | ICD-10-CM | POA: Diagnosis not present

## 2021-04-17 DIAGNOSIS — J3489 Other specified disorders of nose and nasal sinuses: Secondary | ICD-10-CM | POA: Diagnosis not present

## 2021-04-17 MED ORDER — GADOBENATE DIMEGLUMINE 529 MG/ML IV SOLN
20.0000 mL | Freq: Once | INTRAVENOUS | Status: AC | PRN
  Administered 2021-04-17: 20 mL via INTRAVENOUS

## 2021-04-24 ENCOUNTER — Ambulatory Visit: Payer: BC Managed Care – PPO | Admitting: Nurse Practitioner

## 2021-05-05 ENCOUNTER — Telehealth: Payer: Self-pay

## 2021-05-05 NOTE — Telephone Encounter (Signed)
Pt LVM stating that she received mail from [facility] regarding an  appt for Graves disease. Pt stated that during her appt with you you barely said anything to her and also did not mention to her that she has Graves Disease and stated that she has a Hx of Graves Disease which she stated is not true. She is very confused and would like a cal from you personally bc there is some miscommunication somewhere. Pt already has an eye dr if she needs to be seen by one.

## 2021-05-06 ENCOUNTER — Telehealth: Payer: Self-pay

## 2021-05-06 NOTE — Telephone Encounter (Signed)
Pt LVM and would like a cb from you to clear up some mus communication about her having Graves Disease.

## 2021-05-08 ENCOUNTER — Telehealth (INDEPENDENT_AMBULATORY_CARE_PROVIDER_SITE_OTHER): Payer: BC Managed Care – PPO | Admitting: Endocrinology

## 2021-05-08 DIAGNOSIS — H02409 Unspecified ptosis of unspecified eyelid: Secondary | ICD-10-CM | POA: Diagnosis not present

## 2021-05-08 NOTE — Patient Instructions (Signed)
You do not have Grave's Dz of the eyes, or MG.   Please come back for a follow-up appointment in 6 months.

## 2021-05-08 NOTE — Telephone Encounter (Signed)
Patient's VV has now been scheduled

## 2021-05-08 NOTE — Progress Notes (Signed)
Subjective:    Patient ID: [name] [name], female    DOB: 1985-09-29, 36 y.o.   MRN: [id number]  HPI telehealth visit today via video visit.  Alternatives to telehealth are presented to this patient, and the patient agrees to the telehealth visit. Pt is advised of the cost of the visit, and agrees to this, also.   Patient is at home, and I am at the office.   Persons attending the telehealth visit: the patient and I.   Right eyelid drooping persists.  Pt questions referral to opthal Past Medical History:  Diagnosis Date   Allergic rhinitis    Bronchitis    Dyspnea    3 times a week   Headache    Hyperthyroidism    Neuromuscular disorder ([place])    carpel tunnel left hand   Pre-diabetes    SVD (spontaneous vaginal delivery) 2004   x 1    Past Surgical History:  Procedure Laterality Date   DILATION AND CURETTAGE OF UTERUS     termination x 1   MYOMECTOMY N/A 12/01/2017   Procedure: MYOMECTOMY;  Surgeon: Christophe Louis, MD;  Location: [facility];  Service: Gynecology;  Laterality: N/A;   WISDOM TOOTH EXTRACTION      Social History   Socioeconomic History   Marital status: Married    Spouse name: Barbaraann Rondo   Number of children: 1   Years of education: Not on file   Highest education level: Not on file  Occupational History   Not on file  Tobacco Use   Smoking status: Former    Packs/day: 0.25    Years: 17.00    Pack years: 4.25    Types: Cigars, Cigarettes    Quit date: 10/31/2017    Years since quitting: 3.5   Smokeless tobacco: Never   Tobacco comments:    Not smoked since 10/2017  Vaping Use   Vaping Use: Never used  Substance and Sexual Activity   Alcohol use: Yes    Comment: Once or twice a month   Drug use: No   Sexual activity: Yes    Birth control/protection: I.U.D.    Comment: Mirena IUD  Other Topics Concern   Not on file  Social History Narrative   Right Handed    Lives in an apartment on the second floor    Social Determinants of Health    Financial Resource Strain: Not on file  Food Insecurity: Not on file  Transportation Needs: Not on file  Physical Activity: Not on file  Stress: Not on file  Social Connections: Not on file  Intimate Partner Violence: Not on file    Current Outpatient Medications on File Prior to Visit  Medication Sig Dispense Refill   albuterol (PROVENTIL HFA;VENTOLIN HFA) 108 (90 Base) MCG/ACT inhaler Inhale 1-2 puffs into the lungs every 6 (six) hours as needed for wheezing or shortness of breath. 1 Inhaler 0   albuterol (PROVENTIL) (2.5 MG/3ML) 0.083% nebulizer solution Take 3 mLs (2.5 mg total) by nebulization every 6 (six) hours as needed for wheezing or shortness of breath. 75 mL 0   cyclobenzaprine (FLEXERIL) 10 MG tablet Take 1 tablet (10 mg total) by mouth 2 (two) times daily as needed for muscle spasms. 10 tablet 0   fluticasone (FLONASE) 50 MCG/ACT nasal spray Place 2 sprays into both nostrils daily. 1 g 0   ibuprofen (ADVIL,MOTRIN) 800 MG tablet Take 1 tablet (800 mg total) by mouth every 8 (eight) hours as needed (mild pain). 30 tablet  0   levonorgestrel (MIRENA) 20 MCG/24HR IUD 1 each by Intrauterine route once.     Liraglutide -Weight Management (SAXENDA) 18 MG/3ML SOPN Use as directed 3 mL 1   methimazole (TAPAZOLE) 5 MG tablet TAKE 1 TABLET BY MOUTH 3 TIMES A WEEK 40 tablet 3   montelukast (SINGULAIR) 10 MG tablet Take 1 tablet (10 mg total) by mouth at bedtime. 30 tablet 0   Vitamin D, Ergocalciferol, (DRISDOL) 1.25 MG (50000 UNIT) CAPS capsule Take 1 capsule (50,000 Units total) by mouth 2 (two) times a week. 24 capsule 1   No current facility-administered medications on file prior to visit.    No Known Allergies  Family History  Problem Relation Age of Onset   Heart disease Father    Thyroid disease Neg Hx     There were no vitals taken for this visit.    Review of Systems     Objective:   Physical Exam   I reviewed MRI results with patient    Assessment & Plan:   Ptosis: we discussed consult and results from neurol appt.  She does not have MG Hyperthyroidism: we discussed the fact that she does not have thyroid opthalmopathy.  I told her she does not need to go to opthal for this reason   Patient Instructions  You do not have Grave's Dz of the eyes, or MG.   Please come back for a follow-up appointment in 6 months.

## 2021-05-29 ENCOUNTER — Other Ambulatory Visit: Payer: Self-pay | Admitting: Obstetrics and Gynecology

## 2021-06-03 ENCOUNTER — Other Ambulatory Visit: Payer: Self-pay | Admitting: Obstetrics and Gynecology

## 2021-06-03 DIAGNOSIS — D259 Leiomyoma of uterus, unspecified: Secondary | ICD-10-CM

## 2021-06-18 ENCOUNTER — Ambulatory Visit
Admission: RE | Admit: 2021-06-18 | Discharge: 2021-06-18 | Disposition: A | Discharge status: Home / Self Care | Payer: BC Managed Care – PPO | Source: Ambulatory Visit | Attending: Obstetrics and Gynecology | Admitting: Obstetrics and Gynecology

## 2021-06-18 DIAGNOSIS — D251 Intramural leiomyoma of uterus: Secondary | ICD-10-CM | POA: Diagnosis not present

## 2021-06-18 DIAGNOSIS — K429 Umbilical hernia without obstruction or gangrene: Secondary | ICD-10-CM | POA: Diagnosis not present

## 2021-06-18 DIAGNOSIS — D259 Leiomyoma of uterus, unspecified: Secondary | ICD-10-CM

## 2021-06-18 DIAGNOSIS — K8020 Calculus of gallbladder without cholecystitis without obstruction: Secondary | ICD-10-CM | POA: Diagnosis not present

## 2021-06-18 DIAGNOSIS — K449 Diaphragmatic hernia without obstruction or gangrene: Secondary | ICD-10-CM | POA: Diagnosis not present

## 2021-06-18 MED ORDER — GADOBENATE DIMEGLUMINE 529 MG/ML IV SOLN
20.0000 mL | Freq: Once | INTRAVENOUS | Status: AC | PRN
  Administered 2021-06-18: 20 mL via INTRAVENOUS

## 2021-06-26 ENCOUNTER — Ambulatory Visit (INDEPENDENT_AMBULATORY_CARE_PROVIDER_SITE_OTHER): Payer: BC Managed Care – PPO | Admitting: Nurse Practitioner

## 2021-06-26 ENCOUNTER — Encounter: Payer: Self-pay | Admitting: Nurse Practitioner

## 2021-06-26 ENCOUNTER — Other Ambulatory Visit (HOSPITAL_COMMUNITY)
Admission: RE | Admit: 2021-06-26 | Discharge: 2021-06-26 | Disposition: A | Discharge status: Home / Self Care | Payer: BC Managed Care – PPO | Source: Ambulatory Visit | Attending: Nurse Practitioner | Admitting: Nurse Practitioner

## 2021-06-26 VITALS — BP 120/76 | HR 75 | Temp 98.9°F | Ht 65.0 in | Wt 228.0 lb

## 2021-06-26 DIAGNOSIS — E8881 Metabolic syndrome: Secondary | ICD-10-CM | POA: Diagnosis not present

## 2021-06-26 DIAGNOSIS — Z79899 Other long term (current) drug therapy: Secondary | ICD-10-CM

## 2021-06-26 DIAGNOSIS — Z113 Encounter for screening for infections with a predominantly sexual mode of transmission: Secondary | ICD-10-CM | POA: Insufficient documentation

## 2021-06-26 DIAGNOSIS — K8020 Calculus of gallbladder without cholecystitis without obstruction: Secondary | ICD-10-CM

## 2021-06-26 DIAGNOSIS — Z114 Encounter for screening for human immunodeficiency virus [HIV]: Secondary | ICD-10-CM | POA: Diagnosis not present

## 2021-06-26 DIAGNOSIS — E559 Vitamin D deficiency, unspecified: Secondary | ICD-10-CM | POA: Diagnosis not present

## 2021-06-26 DIAGNOSIS — K449 Diaphragmatic hernia without obstruction or gangrene: Secondary | ICD-10-CM

## 2021-06-26 DIAGNOSIS — Z0000 Encounter for general adult medical examination without abnormal findings: Secondary | ICD-10-CM | POA: Diagnosis not present

## 2021-06-26 DIAGNOSIS — Z23 Encounter for immunization: Secondary | ICD-10-CM | POA: Diagnosis not present

## 2021-06-26 DIAGNOSIS — Z1159 Encounter for screening for other viral diseases: Secondary | ICD-10-CM

## 2021-06-26 DIAGNOSIS — Z6837 Body mass index (BMI) 37.0-37.9, adult: Secondary | ICD-10-CM

## 2021-06-26 NOTE — Patient Instructions (Addendum)
Health Maintenance, Female ?Adopting a healthy lifestyle and getting preventive care are important in promoting health and wellness. Ask your health care provider about: ?The right schedule for you to have regular tests and exams. ?Things you can do on your own to prevent diseases and keep yourself healthy. ?What should I know about diet, weight, and exercise? ?Eat a healthy diet ? ?Eat a diet that includes plenty of vegetables, fruits, low-fat dairy products, and lean protein. ?Do not eat a lot of foods that are high in solid fats, added sugars, or sodium. ?Maintain a healthy weight ?Body mass index (BMI) is used to identify weight problems. It estimates body fat based on height and weight. Your health care provider can help determine your BMI and help you achieve or maintain a healthy weight. ?Get regular exercise ?Get regular exercise. This is one of the most important things you can do for your health. Most adults should: ?Exercise for at least 150 minutes each week. The exercise should increase your heart rate and make you sweat (moderate-intensity exercise). ?Do strengthening exercises at least twice a week. This is in addition to the moderate-intensity exercise. ?Spend less time sitting. Even light physical activity can be beneficial. ?Watch cholesterol and blood lipids ?Have your blood tested for lipids and cholesterol at 38 years of age, then have this test every 5 years. ?Have your cholesterol levels checked more often if: ?Your lipid or cholesterol levels are high. ?You are older than 38 years of age. ?You are at high risk for heart disease. ?What should I know about cancer screening? ?Depending on your health history and family history, you may need to have cancer screening at various ages. This may include screening for: ?Breast cancer. ?Cervical cancer. ?Colorectal cancer. ?Skin cancer. ?Lung cancer. ?What should I know about heart disease, diabetes, and high blood pressure? ?Blood pressure and heart  disease ?High blood pressure causes heart disease and increases the risk of stroke. This is more likely to develop in people who have high blood pressure readings or are overweight. ?Have your blood pressure checked: ?Every 3-5 years if you are 19-36 years of age. ?Every year if you are 48 years old or older. ?Diabetes ?Have regular diabetes screenings. This checks your fasting blood sugar level. Have the screening done: ?Once every three years after age 18 if you are at a normal weight and have a low risk for diabetes. ?More often and at a younger age if you are overweight or have a high risk for diabetes. ?What should I know about preventing infection? ?Hepatitis B ?If you have a higher risk for hepatitis B, you should be screened for this virus. Talk with your health care provider to find out if you are at risk for hepatitis B infection. ?Hepatitis C ?Testing is recommended for: ?Everyone born from [birth year] through 1967. ?Anyone with known risk factors for hepatitis C. ?Sexually transmitted infections (STIs) ?Get screened for STIs, including gonorrhea and chlamydia, if: ?You are sexually active and are younger than 38 years of age. ?You are older than 39 years of age and your health care provider tells you that you are at risk for this type of infection. ?Your sexual activity has changed since you were last screened, and you are at increased risk for chlamydia or gonorrhea. Ask your health care provider if you are at risk. ?Ask your health care provider about whether you are at high risk for HIV. Your health care provider may recommend a prescription medicine to help prevent HIV  infection. If you choose to take medicine to prevent HIV, you should first get tested for HIV. You should then be tested every 3 months for as long as you are taking the medicine. ?Pregnancy ?If you are about to stop having your period (premenopausal) and you may become pregnant, seek counseling before you get pregnant. ?Take 400 to 800  micrograms (mcg) of folic acid every day if you become pregnant. ?Ask for birth control (contraception) if you want to prevent pregnancy. ?Osteoporosis and menopause ?Osteoporosis is a disease in which the bones lose minerals and strength with aging. This can result in bone fractures. If you are 1 years old or older, or if you are at risk for osteoporosis and fractures, ask your health care provider if you should: ?Be screened for bone loss. ?Take a calcium or vitamin D supplement to lower your risk of fractures. ?Be given hormone replacement therapy (HRT) to treat symptoms of menopause. ?Follow these instructions at home: ?Alcohol use ?Do not drink alcohol if: ?Your health care provider tells you not to drink. ?You are pregnant, may be pregnant, or are planning to become pregnant. ?If you drink alcohol: ?Limit how much you have to: ?0-1 drink a day. ?Know how much alcohol is in your drink. In the [place] one drink equals one 12 oz bottle of beer (355 mL), one 5 oz glass of wine (148 mL), or one 1? oz glass of hard liquor (44 mL). ?Lifestyle ?Do not use any products that contain nicotine or tobacco. These products include cigarettes, chewing tobacco, and vaping devices, such as e-cigarettes. If you need help quitting, ask your health care provider. ?Do not use street drugs. ?Do not share needles. ?Ask your health care provider for help if you need support or information about quitting drugs. ?General instructions ?Schedule regular health, dental, and eye exams. ?Stay current with your vaccines. ?Tell your health care provider if: ?You often feel depressed. ?You have ever been abused or do not feel safe at home. ?Summary ?Adopting a healthy lifestyle and getting preventive care are important in promoting health and wellness. ?Follow your health care provider's instructions about healthy diet, exercising, and getting tested or screened for diseases. ?Follow your health care provider's instructions on monitoring your  cholesterol and blood pressure. ?This information is not intended to replace advice given to you by your health care provider. Make sure you discuss any questions you have with your health care provider. ?Document Revised: 07/22/2020 Document Reviewed: 07/22/2020 ?Elsevier Patient Education ? [place]. ? ? ?Cholelithiasis ?Cholelithiasis is a disease in which gallstones form in the gallbladder. The gallbladder is an organ that stores bile. Bile is a fluid that helps to digest fats. Gallstones begin as small crystals and can slowly grow into stones. They may cause no symptoms until they block the gallbladder duct, or cystic duct, when the gallbladder tightens (contracts) after food is eaten. This can cause pain and is known as a gallbladder attack, or biliary colic. ?There are two main types of gallstones: ?Cholesterol stones. These are the most common type of gallstone. These stones are made of hardened cholesterol and are usually yellow-green in color. Cholesterol is a fat-like substance that is made in the liver. ?Pigment stones. These are dark in color and are made of a red-yellow substance, called bilirubin,that forms when hemoglobin from red blood cells breaks down. ?What are the causes? ?This condition may be caused by an imbalance in the different parts that make bile. This can happen if the  bile: ?Has too much bilirubin. This can happen in certain blood diseases, such as sickle cell anemia. ?Has too much cholesterol. ?Does not have enough bile salts. These salts help the body absorb and digest fats. ?In some cases, this condition can also be caused by the gallbladder not emptying completely or often enough. This is common during pregnancy. ?What increases the risk? ?The following factors may make you more likely to develop this condition: ?Being female. ?Having multiple pregnancies. Health care providers sometimes advise removing diseased gallbladders before future pregnancies. ?Eating a diet that  is heavy in fried foods, fat, and refined carbohydrates, such as white bread and white rice. ?Being obese. ?Being older than age 37. ?Using medicines that contain female hormones (estrogen) for a long time. ?L

## 2021-06-26 NOTE — Progress Notes (Signed)
?Industrial/product designer as a Education administrator for [employer], FNP.,have documented all relevant documentation on the behalf of Minette Brine, FNP,as directed by  Minette Brine, FNP while in the presence of Minette Brine, [place]. ? ?This visit occurred during the SARS-CoV-2 public health emergency.  Safety protocols were in place, including screening questions prior to the visit, additional usage of staff PPE, and extensive cleaning of exam room while observing appropriate contact time as indicated for disinfecting solutions. ? ?Subjective:  ?  ? Patient ID: [name] [name] , female    DOB: [birth year] , 36 y.o.   MRN: [id number] ? ? ?Chief Complaint  ?Patient presents with  ? Annual Exam  ? ? ?HPI ? ?Patient presents for HM.  Sees Dr. Garwin Brothers for her GYN care. Recently had an MRI of her Abdomen to evaluate fibroids.  She had a virtual visit with Dr. Loanne Drilling  ? ?Wt Readings from Last 3 Encounters: ?06/26/21 : 228 lb (103.4 kg) ?03/26/21 : 233 lb (105.7 kg) ?03/13/21 : 235 lb 9.6 oz (106.9 kg) ? ?She started out at 267 lbs.  ? ?  ? ?Past Medical History:  ?Diagnosis Date  ? Allergic rhinitis   ? Bronchitis   ? Dyspnea   ? 3 times a week  ? Headache   ? Hyperthyroidism   ? Neuromuscular disorder ([place])   ? carpel tunnel left hand  ? Pre-diabetes   ? SVD (spontaneous vaginal delivery) 2004  ? x 1  ?  ? ?Family History  ?Problem Relation Age of Onset  ? Heart disease Father   ? Thyroid disease Neg Hx   ? ? ? ?Current Outpatient Medications:  ?  albuterol (PROVENTIL HFA;VENTOLIN HFA) 108 (90 Base) MCG/ACT inhaler, Inhale 1-2 puffs into the lungs every 6 (six) hours as needed for wheezing or shortness of breath., Disp: 1 Inhaler, Rfl: 0 ?  cyclobenzaprine (FLEXERIL) 10 MG tablet, Take 1 tablet (10 mg total) by mouth 2 (two) times daily as needed for muscle spasms., Disp: 10 tablet, Rfl: 0 ?  fluticasone (FLONASE) 50 MCG/ACT nasal spray, Place 2 sprays into both nostrils daily., Disp: 1 g, Rfl: 0 ?  ibuprofen (ADVIL,MOTRIN) 800 MG  tablet, Take 1 tablet (800 mg total) by mouth every 8 (eight) hours as needed (mild pain)., Disp: 30 tablet, Rfl: 0 ?  levonorgestrel (MIRENA) 20 MCG/24HR IUD, 1 each by Intrauterine route once., Disp: , Rfl:  ?  methimazole (TAPAZOLE) 5 MG tablet, TAKE 1 TABLET BY MOUTH 3 TIMES A WEEK, Disp: 40 tablet, Rfl: 3 ?  montelukast (SINGULAIR) 10 MG tablet, Take 1 tablet (10 mg total) by mouth at bedtime., Disp: 30 tablet, Rfl: 0 ?  Vitamin D, Ergocalciferol, (DRISDOL) 1.25 MG (50000 UNIT) CAPS capsule, Take 1 capsule (50,000 Units total) by mouth 2 (two) times a week., Disp: 24 capsule, Rfl: 1 ?  albuterol (PROVENTIL) (2.5 MG/3ML) 0.083% nebulizer solution, Take 3 mLs (2.5 mg total) by nebulization every 6 (six) hours as needed for wheezing or shortness of breath. (Patient not taking: Reported on 06/26/2021), Disp: 75 mL, Rfl: 0  ? ?No Known Allergies  ? ? ?The patient states she uses IUD for birth control.  No LMP recorded. (Menstrual status: IUD).. Negative for Dysmenorrhea and Negative for Menorrhagia. Negative for: breast discharge, breast lump(s), breast pain and breast self exam. Associated symptoms include abnormal vaginal bleeding. Pertinent negatives include abnormal bleeding (hematology), anxiety, decreased libido, depression, difficulty falling sleep, dyspareunia, history of infertility, nocturia, sexual dysfunction, sleep disturbances, urinary incontinence, urinary  urgency, vaginal discharge and vaginal itching. Diet regular. She has been trying to fast the last few days and she will eat popcorn. The patient states her exercise level is  ?None.  ? ?The patient's tobacco use is:  ?Social History  ? ?Tobacco Use  ?Smoking Status Former  ? Packs/day: 0.25  ? Years: 17.00  ? Pack years: 4.25  ? Types: Cigars, Cigarettes  ? Quit date: 10/31/2017  ? Years since quitting: 3.6  ?Smokeless Tobacco Never  ?Tobacco Comments  ? Not smoked since 10/2017  ? ?She has been exposed to passive smoke. The patient's alcohol use is:   ?Social History  ? ?Substance and Sexual Activity  ?Alcohol Use Yes  ? Comment: Once or twice a month  ? ?Additional information: Last pap 05/2017, next one scheduled for with Dr. Garwin Brothers - she will call for an appt.   ? ?Review of Systems  ?Constitutional: Negative.   ?HENT: Negative.    ?Eyes: Negative.   ?Respiratory: Negative.    ?Cardiovascular: Negative.   ?Gastrointestinal: Negative.   ?Endocrine: Negative.   ?Genitourinary: Negative.   ?Musculoskeletal: Negative.   ?Skin: Negative.   ?Allergic/Immunologic: Negative.   ?Neurological: Negative.   ?Hematological: Negative.   ?Psychiatric/Behavioral: Negative.     ? ?Today's Vitals  ? 06/26/21 1412  ?BP: 120/76  ?Pulse: 75  ?Temp: 98.9 ?F (37.2 ?C)  ?TempSrc: Oral  ?SpO2: 96%  ?Weight: 228 lb (103.4 kg)  ?Height: '5\' 5"'$  (1.651 m)  ? ?Body mass index is 37.94 kg/m?.  ? ?Objective:  ?Physical Exam ?Constitutional:   ?   General: She is not in acute distress. ?   Appearance: Normal appearance. She is well-developed. She is obese.  ?HENT:  ?   Head: Normocephalic and atraumatic.  ?   Right Ear: Hearing, tympanic membrane, ear canal and external ear normal. There is no impacted cerumen.  ?   Left Ear: Hearing, tympanic membrane, ear canal and external ear normal. There is no impacted cerumen.  ?   Nose:  ?   Comments: Deferred - masked ?   Mouth/Throat:  ?   Comments: Deferred - masked ?Eyes:  ?   General: Lids are normal.  ?   Extraocular Movements: Extraocular movements intact.  ?   Conjunctiva/sclera: Conjunctivae normal.  ?   Pupils: Pupils are equal, round, and reactive to light.  ?   Funduscopic exam: ?   Right eye: No papilledema.     ?   Left eye: No papilledema.  ?Neck:  ?   Thyroid: No thyroid mass.  ?   Vascular: No carotid bruit.  ?Cardiovascular:  ?   Rate and Rhythm: Normal rate and regular rhythm.  ?   Pulses: Normal pulses.  ?   Heart sounds: Normal heart sounds. No murmur heard. ?Pulmonary:  ?   Effort: Pulmonary effort is normal.  ?   Breath sounds:  Normal breath sounds.  ?Chest:  ?   Chest wall: No mass.  ?Breasts: ?   Tanner Score is 5.  ?   Right: Normal. No mass or tenderness.  ?   Left: Normal. No mass or tenderness.  ?Abdominal:  ?   General: Abdomen is flat. Bowel sounds are normal. There is no distension.  ?   Palpations: Abdomen is soft.  ?   Tenderness: There is no abdominal tenderness.  ?Genitourinary: ?   Rectum: Guaiac result negative.  ?Musculoskeletal:     ?   General: No swelling. Normal range of motion.  ?  Cervical back: Full passive range of motion without pain, normal range of motion and neck supple.  ?   Right lower leg: No edema.  ?   Left lower leg: No edema.  ?Lymphadenopathy:  ?   Upper Body:  ?   Right upper body: No supraclavicular, axillary or pectoral adenopathy.  ?   Left upper body: No supraclavicular, axillary or pectoral adenopathy.  ?Skin: ?   General: Skin is warm and dry.  ?   Capillary Refill: Capillary refill takes less than 2 seconds.  ?Neurological:  ?   General: No focal deficit present.  ?   Mental Status: She is alert and oriented to person, place, and time.  ?   Cranial Nerves: No cranial nerve deficit.  ?   Sensory: No sensory deficit.  ?Psychiatric:     ?   Mood and Affect: Mood normal.     ?   Behavior: Behavior normal.     ?   Thought Content: Thought content normal.     ?   Judgment: Judgment normal.  ?  ? ?   ?Assessment And Plan:  ?   ?1. Encounter for annual physical exam ?Behavior modifications discussed and diet history reviewed.   ?Pt will continue to exercise regularly and modify diet with low GI, plant based foods and decrease intake of processed foods.  ?Recommend intake of daily multivitamin, Vitamin D, and calcium.  ?Recommend for preventive screenings, as well as recommend immunizations that include influenza, TDAP (given today) ?Discussed importance of self breast exams ? ?2. Encounter for hepatitis C screening test for low risk patient ?Will check Hepatitis C screening due to recent recommendations  to screen all adults 18 years and older ?- Hepatitis C antibody ? ?3. Class 2 severe obesity due to excess calories with serious comorbidity and body mass index (BMI) of 37.0 to 37.9 in adult [facility]) ?[place]

## 2021-06-27 LAB — CMP14+EGFR
ALT: 29 IU/L (ref 0–32)
AST: 18 IU/L (ref 0–40)
Albumin/Globulin Ratio: 1.6 (ref 1.2–2.2)
Albumin: 4.3 g/dL (ref 3.8–4.8)
Alkaline Phosphatase: 88 IU/L (ref 44–121)
BUN/Creatinine Ratio: 9 (ref 9–23)
BUN: 7 mg/dL (ref 6–20)
Bilirubin Total: 0.3 mg/dL (ref 0.0–1.2)
CO2: 20 mmol/L (ref 20–29)
Calcium: 9.6 mg/dL (ref 8.7–10.2)
Chloride: 103 mmol/L (ref 96–106)
Creatinine, Ser: 0.79 mg/dL (ref 0.57–1.00)
Globulin, Total: 2.7 g/dL (ref 1.5–4.5)
Glucose: 85 mg/dL (ref 70–99)
Potassium: 4.1 mmol/L (ref 3.5–5.2)
Sodium: 140 mmol/L (ref 134–144)
Total Protein: 7 g/dL (ref 6.0–8.5)
eGFR: 99 mL/min/{1.73_m2} (ref >59)

## 2021-06-27 LAB — LIPID PANEL
Chol/HDL Ratio: 3.8 ratio (ref 0.0–4.4)
Cholesterol, Total: 150 mg/dL (ref 100–199)
HDL: 40 mg/dL (ref >39)
LDL Chol Calc (NIH): 95 mg/dL (ref 0–99)
Triglycerides: 78 mg/dL (ref 0–149)
VLDL Cholesterol Cal: 15 mg/dL (ref 5–40)

## 2021-06-27 LAB — CBC
Hematocrit: 40.6 % (ref 34.0–46.6)
Hemoglobin: 13.3 g/dL (ref 11.1–15.9)
MCH: 27.4 pg (ref 26.6–33.0)
MCHC: 32.8 g/dL (ref 31.5–35.7)
MCV: 84 fL (ref 79–97)
Platelets: 385 10*3/uL (ref 150–450)
RBC: 4.86 x10E6/uL (ref 3.77–5.28)
RDW: 13.3 % (ref 11.7–15.4)
WBC: 7 10*3/uL (ref 3.4–10.8)

## 2021-06-27 LAB — INSULIN, RANDOM: INSULIN: 11.4 u[IU]/mL (ref 2.6–24.9)

## 2021-06-27 LAB — HEPATITIS C ANTIBODY: Hep C Virus Ab: NONREACTIVE

## 2021-06-27 LAB — HEMOGLOBIN A1C
Est. average glucose Bld gHb Est-mCnc: 120 mg/dL
Hgb A1c MFr Bld: 5.8 % — ABNORMAL HIGH (ref 4.8–5.6)

## 2021-06-27 LAB — HIV ANTIBODY (ROUTINE TESTING W REFLEX): HIV Screen 4th Generation wRfx: NONREACTIVE

## 2021-06-27 LAB — RPR: RPR Ser Ql: NONREACTIVE

## 2021-06-27 LAB — VITAMIN D 25 HYDROXY (VIT D DEFICIENCY, FRACTURES): Vit D, 25-Hydroxy: 24.3 ng/mL — ABNORMAL LOW (ref 30.0–100.0)

## 2021-06-27 LAB — HEPATITIS B SURFACE ANTIGEN: Hepatitis B Surface Ag: NEGATIVE

## 2021-06-27 LAB — HSV(HERPES SIMPLEX VRS) I + II AB-IGG
HSV 1 Glycoprotein G Ab, IgG: <0.91 index (ref 0.00–0.90)
HSV 2 IgG, Type Spec: <0.91 index (ref 0.00–0.90)

## 2021-06-30 DIAGNOSIS — Z01419 Encounter for gynecological examination (general) (routine) without abnormal findings: Secondary | ICD-10-CM | POA: Diagnosis not present

## 2021-06-30 DIAGNOSIS — D259 Leiomyoma of uterus, unspecified: Secondary | ICD-10-CM | POA: Diagnosis not present

## 2021-06-30 DIAGNOSIS — E0590 Thyrotoxicosis, unspecified without thyrotoxic crisis or storm: Secondary | ICD-10-CM | POA: Diagnosis not present

## 2021-06-30 DIAGNOSIS — N911 Secondary amenorrhea: Secondary | ICD-10-CM | POA: Diagnosis not present

## 2021-07-01 LAB — URINE CYTOLOGY ANCILLARY ONLY
Bacterial Vaginitis-Urine: NEGATIVE
Candida Urine: NEGATIVE
Chlamydia: NEGATIVE
Comment: NEGATIVE
Comment: NEGATIVE
Comment: NORMAL
Neisseria Gonorrhea: NEGATIVE
Trichomonas: NEGATIVE

## 2021-07-07 MED ORDER — VITAMIN D (ERGOCALCIFEROL) 1.25 MG (50000 UNIT) PO CAPS: 50000.0000 [IU] | ORAL_CAPSULE | ORAL | 1 refills | Status: DC

## 2021-07-23 DIAGNOSIS — K8020 Calculus of gallbladder without cholecystitis without obstruction: Secondary | ICD-10-CM | POA: Diagnosis not present

## 2021-07-24 DIAGNOSIS — J069 Acute upper respiratory infection, unspecified: Secondary | ICD-10-CM | POA: Diagnosis not present

## 2021-10-06 ENCOUNTER — Other Ambulatory Visit: Payer: Self-pay | Admitting: Obstetrics and Gynecology

## 2021-11-12 NOTE — Pre-Procedure Instructions (Signed)
Surgical Instructions    Your procedure is scheduled on Friday September 8.  Report to [facility] Main Entrance "A" at 1:00 P.M., then check in with the Admitting office.  Call this number if you have problems the morning of surgery:  [phone]   If you have any questions prior to your surgery date call [phone]: Open Monday-Friday 8am-4pm   Remember:  Do not eat after midnight the night before your surgery (please call your surgeon's office to determine if this can be later).  You may drink clear liquids until noon the morning of your surgery.   Clear liquids allowed are: Water, Non-Citrus Juices (without pulp), Carbonated Beverages, Clear Tea, Black Coffee ONLY (NO MILK, CREAM OR POWDERED CREAMER of any kind), and Gatorade    Take these medicines the morning of surgery with A SIP OF WATER:  methimazole (TAPAZOLE)  IF NEEDED albuterol (PROVENTIL HFA;VENTOLIN HFA) cyclobenzaprine (FLEXERIL)  fluticasone (FLONASE) levocetirizine (XYZAL)   As of today, STOP taking any Aspirin (unless otherwise instructed by your surgeon) Aleve, Naproxen, Ibuprofen, Motrin, Advil, Goody's, BC's, all herbal medications, fish oil, and all vitamins. This includes aspirin-acetaminophen-caffeine (EXCEDRIN MIGRAINE)           Do not wear jewelry or makeup. Do not wear lotions, powders, perfumes or deodorant. Do not shave 48 hours prior to surgery. Do not bring valuables to the hospital. Do not wear nail polish, gel polish, artificial nails, or any other type of covering on natural nails (fingers and toes) If you have artificial nails or gel coating that need to be removed by a nail salon, please have this removed prior to surgery. Artificial nails or gel coating may interfere with anesthesia's ability to adequately monitor your vital signs.  [facility] is not responsible for any belongings or valuables.    Do NOT Smoke (Tobacco/Vaping)  24 hours prior to your procedure  If you use a CPAP at  night, you may bring your mask for your overnight stay.   Contacts, glasses, hearing aids, dentures or partials may not be worn into surgery, please bring cases for these belongings   For patients admitted to the hospital, discharge time will be determined by your treatment team.   Patients discharged the day of surgery will not be allowed to drive home, and someone needs to stay with them for 24 hours.   SURGICAL WAITING ROOM VISITATION Patients having surgery or a procedure may have no more than 2 support people in the waiting area - these visitors may rotate.   Children under the age of 72 must have an adult with them who is not the patient. If the patient needs to stay at the hospital during part of their recovery, the visitor guidelines for inpatient rooms apply. Pre-op nurse will coordinate an appropriate time for 1 support person to accompany patient in pre-op.  This support person may not rotate.   Please refer to the [facility] website for the visitor guidelines for Inpatients (after your surgery is over and you are in a regular room).    Special instructions:    Oral Hygiene is also important to reduce your risk of infection.  Remember - BRUSH YOUR TEETH THE MORNING OF SURGERY WITH YOUR REGULAR TOOTHPASTE   [facility]- Preparing For Surgery  Before surgery, you can play an important role. Because skin is not sterile, your skin needs to be as free of germs as possible. You can reduce the number of germs on your skin by washing with CHG (  chlorahexidine gluconate) Soap before surgery.  CHG is an antiseptic cleaner which kills germs and bonds with the skin to continue killing germs even after washing.     Please do not use if you have an allergy to CHG or antibacterial soaps. If your skin becomes reddened/irritated stop using the CHG.  Do not shave (including legs and underarms) for at least 48 hours prior to first CHG shower. It is OK to shave your face.  Please follow these  instructions carefully.     Shower the NIGHT BEFORE SURGERY and the MORNING OF SURGERY with CHG Soap.   If you chose to wash your hair, wash your hair first as usual with your normal shampoo. After you shampoo, rinse your hair and body thoroughly to remove the shampoo.  Then [employer] and genitals (private parts) with your normal soap and rinse thoroughly to remove soap.  After that Use CHG Soap as you would any other liquid soap. You can apply CHG directly to the skin and wash gently with a scrungie or a clean washcloth.   Apply the CHG Soap to your body ONLY FROM THE NECK DOWN.  Do not use on open wounds or open sores. Avoid contact with your eyes, ears, mouth and genitals (private parts). Wash Face and genitals (private parts)  with your normal soap.   Wash thoroughly, paying special attention to the area where your surgery will be performed.  Thoroughly rinse your body with warm water from the neck down.  DO NOT shower/wash with your normal soap after using and rinsing off the CHG Soap.  Pat yourself dry with a CLEAN TOWEL.  Wear CLEAN PAJAMAS to bed the night before surgery  Place CLEAN SHEETS on your bed the night before your surgery  DO NOT SLEEP WITH PETS.   Day of Surgery:  Take a shower with CHG soap. Wear Clean/Comfortable clothing the morning of surgery Do not apply any deodorants/lotions.   Remember to brush your teeth WITH YOUR REGULAR TOOTHPASTE.    If you received a COVID test during your pre-op visit, it is requested that you wear a mask when out in public, stay away from anyone that may not be feeling well, and notify your surgeon if you develop symptoms. If you have been in contact with anyone that has tested positive in the last 10 days, please notify your surgeon.    Please read over the following fact sheets that you were given.

## 2021-11-13 ENCOUNTER — Encounter (HOSPITAL_COMMUNITY)
Admission: RE | Admit: 2021-11-13 | Discharge: 2021-11-13 | Disposition: A | Discharge status: Home / Self Care | Payer: BC Managed Care – PPO | Source: Ambulatory Visit | Attending: Obstetrics and Gynecology | Admitting: Obstetrics and Gynecology

## 2021-11-13 ENCOUNTER — Other Ambulatory Visit: Payer: Self-pay

## 2021-11-13 ENCOUNTER — Encounter (HOSPITAL_COMMUNITY): Payer: Self-pay

## 2021-11-13 VITALS — BP 109/74 | HR 71 | Temp 98.2°F | Resp 17 | Ht 65.0 in | Wt 228.7 lb

## 2021-11-13 DIAGNOSIS — Z01812 Encounter for preprocedural laboratory examination: Secondary | ICD-10-CM | POA: Insufficient documentation

## 2021-11-13 DIAGNOSIS — Z01818 Encounter for other preprocedural examination: Secondary | ICD-10-CM

## 2021-11-13 HISTORY — DX: Personal history of other diseases of the digestive system: Z87.19

## 2021-11-13 HISTORY — DX: Angina pectoris, unspecified: I20.9

## 2021-11-13 LAB — CBC
HCT: 41.3 % (ref 36.0–46.0)
Hemoglobin: 13.6 g/dL (ref 12.0–15.0)
MCH: 27.6 pg (ref 26.0–34.0)
MCHC: 32.9 g/dL (ref 30.0–36.0)
MCV: 83.9 fL (ref 80.0–100.0)
Platelets: 381 10*3/uL (ref 150–400)
RBC: 4.92 MIL/uL (ref 3.87–5.11)
RDW: 14 % (ref 11.5–15.5)
WBC: 6.1 10*3/uL (ref 4.0–10.5)
nRBC: 0 % (ref 0.0–0.2)

## 2021-11-13 LAB — TYPE AND SCREEN
ABO/RH(D): O POS
Antibody Screen: NEGATIVE

## 2021-11-13 NOTE — Progress Notes (Signed)
PCP - Minette Brine, FNP Cardiologist - denies  PPM/ICD - denies   Chest x-ray - 07/07/20 EKG - 09/26/20 Stress Test - denies ECHO - denies Cardiac Cath - denies  Sleep Study - denies   DM- denies  ASA/Blood Thinner Instructions: n/a   ERAS Protcol - yes, no drink   COVID TEST- n/a   Anesthesia review: no  Patient denies shortness of breath, fever, cough and chest pain at PAT appointment   All instructions explained to the patient, with a verbal understanding of the material. Patient agrees to go over the instructions while at home for a better understanding. The opportunity to ask questions was provided.

## 2021-11-21 ENCOUNTER — Encounter (HOSPITAL_COMMUNITY): Payer: Self-pay | Admitting: Anesthesiology

## 2021-11-21 ENCOUNTER — Encounter (HOSPITAL_COMMUNITY)
Admission: RE | Disposition: A | Discharge status: Home / Self Care | Payer: Self-pay | Source: Home / Self Care | Attending: Obstetrics and Gynecology

## 2021-11-21 ENCOUNTER — Ambulatory Visit (HOSPITAL_COMMUNITY)
Admission: RE | Admit: 2021-11-21 | Discharge: 2021-11-21 | Disposition: A | Discharge status: Home / Self Care | Payer: BC Managed Care – PPO | Attending: Obstetrics and Gynecology | Admitting: Obstetrics and Gynecology

## 2021-11-21 ENCOUNTER — Encounter (HOSPITAL_COMMUNITY): Payer: Self-pay | Admitting: Obstetrics and Gynecology

## 2021-11-21 ENCOUNTER — Other Ambulatory Visit: Payer: Self-pay

## 2021-11-21 DIAGNOSIS — D250 Submucous leiomyoma of uterus: Secondary | ICD-10-CM | POA: Insufficient documentation

## 2021-11-21 DIAGNOSIS — E0590 Thyrotoxicosis, unspecified without thyrotoxic crisis or storm: Secondary | ICD-10-CM | POA: Diagnosis not present

## 2021-11-21 DIAGNOSIS — D251 Intramural leiomyoma of uterus: Secondary | ICD-10-CM | POA: Insufficient documentation

## 2021-11-21 DIAGNOSIS — Z01818 Encounter for other preprocedural examination: Principal | ICD-10-CM

## 2021-11-21 LAB — POCT PREGNANCY, URINE: Preg Test, Ur: NEGATIVE

## 2021-11-21 SURGERY — LAPAROTOMY, EXPLORATORY
Anesthesia: General

## 2021-11-21 MED ORDER — ORAL CARE MOUTH RINSE: 15.0000 mL | Freq: Once | OROMUCOSAL | Status: AC

## 2021-11-21 MED ORDER — ACETAMINOPHEN 500 MG PO TABS
1000.0000 mg | ORAL_TABLET | Freq: Once | ORAL | Status: AC
  Administered 2021-11-21: 1000 mg via ORAL
  Filled 2021-11-21: qty 2

## 2021-11-21 MED ORDER — SCOPOLAMINE 1 MG/3DAYS TD PT72
1.0000 | MEDICATED_PATCH | TRANSDERMAL | Status: DC
  Administered 2021-11-21: 1.5 mg via TRANSDERMAL
  Filled 2021-11-21: qty 1

## 2021-11-21 MED ORDER — POVIDONE-IODINE 10 % EX SWAB
2.0000 | Freq: Once | CUTANEOUS | Status: AC
  Administered 2021-11-21: 2 via TOPICAL

## 2021-11-21 MED ORDER — CHLORHEXIDINE GLUCONATE 0.12 % MT SOLN
15.0000 mL | Freq: Once | OROMUCOSAL | Status: AC
  Administered 2021-11-21: 15 mL via OROMUCOSAL
  Filled 2021-11-21: qty 15

## 2021-11-21 MED ORDER — LACTATED RINGERS IV SOLN: INTRAVENOUS | Status: DC

## 2021-11-21 MED ORDER — CEFAZOLIN SODIUM-DEXTROSE 2-4 GM/100ML-% IV SOLN
2.0000 g | INTRAVENOUS | Status: DC
  Filled 2021-11-21: qty 100

## 2021-11-21 NOTE — Anesthesia Preprocedure Evaluation (Deleted)
Anesthesia Evaluation    Reviewed: Allergy & Precautions, Patient's Chart, lab work & pertinent test results  Airway        Dental   Pulmonary former smoker,  Quit smoking 2019          Cardiovascular negative cardio ROS       Neuro/Psych  Headaches, negative psych ROS   GI/Hepatic Neg liver ROS, hiatal hernia,   Endo/Other  diabetes (pre-diabetic), Type 2Obesity BMI 38 a1c 5.8  Renal/GU negative Renal ROS  negative genitourinary   Musculoskeletal negative musculoskeletal ROS (+)   Abdominal   Peds  Hematology negative hematology ROS (+) Hb 13.6, plt 381   Anesthesia Other Findings   Reproductive/Obstetrics uterine Fibroids, Lost IUD String                            Anesthesia Physical Anesthesia Plan  ASA: 2  Anesthesia Plan: General   Post-op Pain Management: Tylenol PO (pre-op)*, Toradol IV (intra-op)*, Ketamine IV*, Dilaudid IV and Precedex   Induction: Intravenous  PONV Risk Score and Plan: 4 or greater and Ondansetron, Dexamethasone, Midazolam, Scopolamine patch - Pre-op and Treatment may vary due to age or medical condition  Airway Management Planned: Oral ETT  Additional Equipment: None  Intra-op Plan:   Post-operative Plan: Extubation in OR  Informed Consent:   Plan Discussed with:   Anesthesia Plan Comments:         Anesthesia Quick Evaluation

## 2021-11-21 NOTE — Progress Notes (Signed)
Pt stated that she was cooking today and tasted the deviled eggs she was cooking around 11 am. Dr. Doroteo Glassman notified. Per Dr. Doroteo Glassman surgery will have to be delayed until 5pm. Notified Dr. Garwin Brothers of surgery delay. Dr. Harvie Bridge is unable to operate at 5pm due to scheduling conflicts. Surgery will have to be rescheduled. Dr. Garwin Brothers will be in to talk to patient.

## 2021-11-21 NOTE — H&P (Signed)
[name] [name] is an 36 y.o. female.  G2P1011 MBF presents for surgical management of uterine fibroid&   lost IUD. Pt underwent MRI of abdomen and pelvis 06/18/2021 where large 13 cm degenerating submucosal fibroid in posterior LUS was noted  in a 19 cm fibroid uterus. Pt desires to maintain fertility. Previous had myomectomy done 12/01/2017 where this posterior fibroid could not be displaced and a fundal 8 cm fibroid was removed  Pertinent Gynecological History: Menses:  IUD Bleeding: n/a Contraception: IUD DES exposure: denies Blood transfusions: none Sexually transmitted diseases: no past history Previous GYN Procedures: DNC  Last mammogram:  n/a  Date: n/a Last pap: normal Date: 2023 OB History: G2, P1011   Menstrual History: Menarche age: n/a No LMP recorded. (Menstrual status: IUD).    Past Medical History:  Diagnosis Date   Allergic rhinitis    Anginal pain (HCC)    hx of (pt had cardiac work-up and everything was normal)   Bronchitis    Dyspnea    3 times a week   Headache    History of hiatal hernia    Hyperthyroidism    Neuromuscular disorder ([place])    carpel tunnel left hand   Pre-diabetes    SVD (spontaneous vaginal delivery) 2004   x 1    Past Surgical History:  Procedure Laterality Date   DILATION AND CURETTAGE OF UTERUS     termination x 1   MYOMECTOMY N/A 12/01/2017   Procedure: MYOMECTOMY;  Surgeon: Christophe Louis, MD;  Location: [facility];  Service: Gynecology;  Laterality: N/A;   WISDOM TOOTH EXTRACTION      Family History  Problem Relation Age of Onset   Heart disease Father    Thyroid disease Neg Hx     Social History:  reports that she quit smoking about 4 years ago. Her smoking use included cigars and cigarettes. She has a 4.25 pack-year smoking history. She has never used smokeless tobacco. She reports current alcohol use. She reports that she does not use drugs.  Allergies: No Known Allergies  No medications prior to admission.    Review of  Systems  All other systems reviewed and are negative.   There were no vitals taken for this visit. Physical Exam Constitutional:      Appearance: Normal appearance. She is obese.  HENT:     Head: Atraumatic.     Mouth/Throat:     Mouth: Mucous membranes are moist.  Eyes:     Extraocular Movements: Extraocular movements intact.  Cardiovascular:     Rate and Rhythm: Regular rhythm.     Heart sounds: Normal heart sounds.  Pulmonary:     Breath sounds: Normal breath sounds.  Abdominal:     Comments: Soft . Pfannensteil skin incision.  Palp mass ( suprapubic) 3 FB  Genitourinary:    General: Normal vulva.     Comments: Vagina nl Large posterior cul de sac mass deviated to the left Cervix displaced ant Uterus enlarged  Adnexa nonpalpable Musculoskeletal:        General: Normal range of motion.     Cervical back: Neck supple.  Skin:    General: Skin is warm and dry.  Neurological:     General: No focal deficit present.     Mental Status: She is alert and oriented to person, place, and time.  Psychiatric:        Mood and Affect: Mood normal.        Behavior: Behavior normal.  No results found for this or any previous visit (from the past 24 hour(s)).  No results found.  Assessment/Plan: Large Fibroid uterus with impacted posterior SM/IM  LUS fibroid Lost IUD hyperthyroidism P) exp lap, myomectomy, removal of IUD Procedure explained. Risk of surgery reviewed including infection, bleeding, possible need for blood transfusion and its risk( HIV, hepatitis, acute rxn), internal scar tissue, need for C/S in the future, infertility due to scarring, inability to remove fibroid, 2% chance of hysterectomy.  2) remove overdue IUD  All ? answered  Jash Wahlen A Jamai Dolce 11/21/2021, 12:53 PM

## 2021-11-22 ENCOUNTER — Other Ambulatory Visit: Payer: Self-pay | Admitting: Obstetrics and Gynecology

## 2021-12-10 ENCOUNTER — Encounter (HOSPITAL_COMMUNITY): Payer: Self-pay | Admitting: Obstetrics and Gynecology

## 2021-12-11 ENCOUNTER — Encounter (HOSPITAL_COMMUNITY): Payer: Self-pay | Admitting: Obstetrics and Gynecology

## 2021-12-11 NOTE — Progress Notes (Signed)
error 

## 2021-12-11 NOTE — Progress Notes (Signed)
PCP - Darleen Crocker, FNP Cardiologist - n/a Endocrinology- n/a Dr Renato Shin retired  Chest x-ray - n/a EKG - n/a Stress Test - n/a ECHO - n/a Cardiac Cath - n/a  ICD Pacemaker/Loop - n/a  Sleep Study -  n/a CPAP - none  ERAS: Clear liquids til 9:45 AM DOS.  STOP now taking any Aspirin (unless otherwise instructed by your surgeon), Aleve, Naproxen, Ibuprofen, Motrin, Advil, Goody's, BC's, all herbal medications, fish oil, and all vitamins.   Coronavirus Screening Do you have any of the following symptoms:  Cough yes/no: No Fever (>100.9F)  yes/no: No Runny nose yes/no: No Sore throat yes/no: No Difficulty breathing/shortness of breath  yes/no: No  Have you traveled in the last 14 days and where? yes/no: No  Patient verbalized understanding of instructions that were given via phone.

## 2021-12-12 ENCOUNTER — Encounter (HOSPITAL_COMMUNITY)
Admission: RE | Disposition: A | Discharge status: Home / Self Care | Payer: Self-pay | Source: Ambulatory Visit | Attending: Obstetrics and Gynecology

## 2021-12-12 ENCOUNTER — Inpatient Hospital Stay (HOSPITAL_COMMUNITY): Payer: BC Managed Care – PPO | Admitting: Anesthesiology

## 2021-12-12 ENCOUNTER — Encounter (HOSPITAL_COMMUNITY): Payer: Self-pay | Admitting: Obstetrics and Gynecology

## 2021-12-12 ENCOUNTER — Inpatient Hospital Stay (HOSPITAL_COMMUNITY)
Admission: RE | Admit: 2021-12-12 | Discharge: 2021-12-14 | DRG: 742 | Disposition: A | Discharge status: Home / Self Care | Payer: BC Managed Care – PPO | Source: Ambulatory Visit | Attending: Obstetrics and Gynecology | Admitting: Obstetrics and Gynecology

## 2021-12-12 ENCOUNTER — Other Ambulatory Visit: Payer: Self-pay

## 2021-12-12 DIAGNOSIS — Z87891 Personal history of nicotine dependence: Secondary | ICD-10-CM | POA: Diagnosis not present

## 2021-12-12 DIAGNOSIS — E669 Obesity, unspecified: Secondary | ICD-10-CM | POA: Diagnosis present

## 2021-12-12 DIAGNOSIS — Z6839 Body mass index (BMI) 39.0-39.9, adult: Secondary | ICD-10-CM | POA: Diagnosis not present

## 2021-12-12 DIAGNOSIS — K660 Peritoneal adhesions (postprocedural) (postinfection): Secondary | ICD-10-CM | POA: Diagnosis present

## 2021-12-12 DIAGNOSIS — Z8249 Family history of ischemic heart disease and other diseases of the circulatory system: Secondary | ICD-10-CM | POA: Diagnosis not present

## 2021-12-12 DIAGNOSIS — Z9889 Other specified postprocedural states: Secondary | ICD-10-CM

## 2021-12-12 DIAGNOSIS — T8332XA Displacement of intrauterine contraceptive device, initial encounter: Secondary | ICD-10-CM | POA: Diagnosis present

## 2021-12-12 DIAGNOSIS — E0590 Thyrotoxicosis, unspecified without thyrotoxic crisis or storm: Secondary | ICD-10-CM | POA: Diagnosis not present

## 2021-12-12 DIAGNOSIS — R7303 Prediabetes: Secondary | ICD-10-CM | POA: Diagnosis not present

## 2021-12-12 DIAGNOSIS — D250 Submucous leiomyoma of uterus: Secondary | ICD-10-CM | POA: Diagnosis not present

## 2021-12-12 DIAGNOSIS — N8301 Follicular cyst of right ovary: Secondary | ICD-10-CM | POA: Diagnosis not present

## 2021-12-12 DIAGNOSIS — D252 Subserosal leiomyoma of uterus: Secondary | ICD-10-CM | POA: Diagnosis not present

## 2021-12-12 DIAGNOSIS — D259 Leiomyoma of uterus, unspecified: Secondary | ICD-10-CM | POA: Diagnosis not present

## 2021-12-12 DIAGNOSIS — D251 Intramural leiomyoma of uterus: Secondary | ICD-10-CM | POA: Diagnosis not present

## 2021-12-12 DIAGNOSIS — D62 Acute posthemorrhagic anemia: Secondary | ICD-10-CM | POA: Diagnosis present

## 2021-12-12 DIAGNOSIS — N8353 Torsion of ovary, ovarian pedicle and fallopian tube: Secondary | ICD-10-CM | POA: Diagnosis not present

## 2021-12-12 DIAGNOSIS — Y848 Other medical procedures as the cause of abnormal reaction of the patient, or of later complication, without mention of misadventure at the time of the procedure: Secondary | ICD-10-CM | POA: Diagnosis not present

## 2021-12-12 HISTORY — PX: IUD REMOVAL: SHX5392

## 2021-12-12 HISTORY — PX: MYOMECTOMY: SHX85

## 2021-12-12 HISTORY — PX: LAPAROTOMY: SHX154

## 2021-12-12 HISTORY — PX: SALPINGOOPHORECTOMY: SHX82

## 2021-12-12 LAB — CBC
HCT: 40 % (ref 36.0–46.0)
Hemoglobin: 12.9 g/dL (ref 12.0–15.0)
MCH: 27.8 pg (ref 26.0–34.0)
MCHC: 32.3 g/dL (ref 30.0–36.0)
MCV: 86.2 fL (ref 80.0–100.0)
Platelets: 381 10*3/uL (ref 150–400)
RBC: 4.64 MIL/uL (ref 3.87–5.11)
RDW: 14.3 % (ref 11.5–15.5)
WBC: 8.1 10*3/uL (ref 4.0–10.5)
nRBC: 0 % (ref 0.0–0.2)

## 2021-12-12 LAB — POCT I-STAT EG7
Acid-Base Excess: 0 mmol/L (ref 0.0–2.0)
Bicarbonate: 25 mmol/L (ref 20.0–28.0)
Calcium, Ion: 1.23 mmol/L (ref 1.15–1.40)
HCT: 29 % — ABNORMAL LOW (ref 36.0–46.0)
Hemoglobin: 9.9 g/dL — ABNORMAL LOW (ref 12.0–15.0)
O2 Saturation: 100 %
Patient temperature: 36.3
Potassium: 4.2 mmol/L (ref 3.5–5.1)
Sodium: 137 mmol/L (ref 135–145)
TCO2: 26 mmol/L (ref 22–32)
pCO2, Ven: 39.1 mmHg — ABNORMAL LOW (ref 44–60)
pH, Ven: 7.411 (ref 7.25–7.43)
pO2, Ven: 189 mmHg — ABNORMAL HIGH (ref 32–45)

## 2021-12-12 LAB — PREPARE RBC (CROSSMATCH)

## 2021-12-12 LAB — POCT PREGNANCY, URINE: Preg Test, Ur: NEGATIVE

## 2021-12-12 SURGERY — LAPAROTOMY, EXPLORATORY
Anesthesia: General

## 2021-12-12 MED ORDER — MIDAZOLAM HCL 2 MG/2ML IJ SOLN
INTRAMUSCULAR | Status: AC
  Filled 2021-12-12: qty 2

## 2021-12-12 MED ORDER — OXYCODONE HCL 5 MG PO TABS
5.0000 mg | ORAL_TABLET | ORAL | Status: DC | PRN
  Filled 2021-12-12: qty 1

## 2021-12-12 MED ORDER — STERILE WATER FOR IRRIGATION IR SOLN
Status: DC | PRN
  Administered 2021-12-12: 1000 mL

## 2021-12-12 MED ORDER — PHENYLEPHRINE 80 MCG/ML (10ML) SYRINGE FOR IV PUSH (FOR BLOOD PRESSURE SUPPORT)
PREFILLED_SYRINGE | INTRAVENOUS | Status: AC
  Filled 2021-12-12: qty 10

## 2021-12-12 MED ORDER — DIPHENHYDRAMINE HCL 50 MG/ML IJ SOLN: 12.5000 mg | Freq: Four times a day (QID) | INTRAMUSCULAR | Status: DC | PRN

## 2021-12-12 MED ORDER — LACTATED RINGERS IV SOLN: INTRAVENOUS | Status: DC

## 2021-12-12 MED ORDER — DIPHENHYDRAMINE HCL 12.5 MG/5ML PO ELIX: 12.5000 mg | ORAL_SOLUTION | Freq: Four times a day (QID) | ORAL | Status: DC | PRN

## 2021-12-12 MED ORDER — LIDOCAINE 2% (20 MG/ML) 5 ML SYRINGE
INTRAMUSCULAR | Status: DC | PRN
  Administered 2021-12-12: 100 mg via INTRAVENOUS

## 2021-12-12 MED ORDER — FENTANYL CITRATE (PF) 250 MCG/5ML IJ SOLN
INTRAMUSCULAR | Status: DC | PRN
  Administered 2021-12-12 (×2): 25 ug via INTRAVENOUS
  Administered 2021-12-12: 100 ug via INTRAVENOUS
  Administered 2021-12-12: 50 ug via INTRAVENOUS
  Administered 2021-12-12 (×2): 25 ug via INTRAVENOUS

## 2021-12-12 MED ORDER — NALOXONE HCL 0.4 MG/ML IJ SOLN: 0.4000 mg | INTRAMUSCULAR | Status: DC | PRN

## 2021-12-12 MED ORDER — HYDROMORPHONE 1 MG/ML IV SOLN: INTRAVENOUS | Status: DC

## 2021-12-12 MED ORDER — METHIMAZOLE 5 MG PO TABS
5.0000 mg | ORAL_TABLET | Freq: Every day | ORAL | Status: DC
  Administered 2021-12-14: 5 mg via ORAL
  Filled 2021-12-12 (×2): qty 1

## 2021-12-12 MED ORDER — ALBUMIN HUMAN 5 % IV SOLN: INTRAVENOUS | Status: DC | PRN

## 2021-12-12 MED ORDER — SCOPOLAMINE 1 MG/3DAYS TD PT72: 1.0000 | MEDICATED_PATCH | TRANSDERMAL | Status: DC

## 2021-12-12 MED ORDER — ONDANSETRON HCL 4 MG/2ML IJ SOLN
INTRAMUSCULAR | Status: AC
  Filled 2021-12-12: qty 2

## 2021-12-12 MED ORDER — SIMETHICONE 80 MG PO CHEW: 80.0000 mg | CHEWABLE_TABLET | Freq: Four times a day (QID) | ORAL | Status: DC | PRN

## 2021-12-12 MED ORDER — FENTANYL CITRATE (PF) 100 MCG/2ML IJ SOLN
25.0000 ug | INTRAMUSCULAR | Status: DC | PRN
  Administered 2021-12-12: 50 ug via INTRAVENOUS

## 2021-12-12 MED ORDER — 0.9 % SODIUM CHLORIDE (POUR BTL) OPTIME
TOPICAL | Status: DC | PRN
  Administered 2021-12-12 (×2): 1000 mL

## 2021-12-12 MED ORDER — VASOPRESSIN 20 UNIT/ML IV SOLN
INTRAVENOUS | Status: AC
  Filled 2021-12-12: qty 1

## 2021-12-12 MED ORDER — SUGAMMADEX SODIUM 500 MG/5ML IV SOLN
INTRAVENOUS | Status: AC
  Filled 2021-12-12: qty 5

## 2021-12-12 MED ORDER — SENNOSIDES-DOCUSATE SODIUM 8.6-50 MG PO TABS: 1.0000 | ORAL_TABLET | Freq: Every evening | ORAL | Status: DC | PRN

## 2021-12-12 MED ORDER — VASOPRESSIN 20 UNIT/ML IV SOLN
INTRAVENOUS | Status: DC | PRN
  Administered 2021-12-12: 20 mL via INTRAMUSCULAR
  Administered 2021-12-12: 10 mL via INTRAMUSCULAR

## 2021-12-12 MED ORDER — CEFAZOLIN SODIUM-DEXTROSE 2-4 GM/100ML-% IV SOLN
2.0000 g | INTRAVENOUS | Status: AC
  Administered 2021-12-12 (×2): 2 g via INTRAVENOUS

## 2021-12-12 MED ORDER — ONDANSETRON HCL 4 MG PO TABS: 4.0000 mg | ORAL_TABLET | Freq: Four times a day (QID) | ORAL | Status: DC | PRN

## 2021-12-12 MED ORDER — ONDANSETRON HCL 4 MG/2ML IJ SOLN: 4.0000 mg | Freq: Four times a day (QID) | INTRAMUSCULAR | Status: DC | PRN

## 2021-12-12 MED ORDER — POVIDONE-IODINE 10 % EX SWAB: 2.0000 | Freq: Once | CUTANEOUS | Status: DC

## 2021-12-12 MED ORDER — CHLORHEXIDINE GLUCONATE 0.12 % MT SOLN: 15.0000 mL | OROMUCOSAL | Status: AC

## 2021-12-12 MED ORDER — PANTOPRAZOLE SODIUM 40 MG PO TBEC
40.0000 mg | DELAYED_RELEASE_TABLET | Freq: Every day | ORAL | Status: DC
  Administered 2021-12-14: 40 mg via ORAL
  Filled 2021-12-12 (×2): qty 1

## 2021-12-12 MED ORDER — VASOPRESSIN 20 UNIT/ML IV SOLN
INTRAVENOUS | Status: AC
  Filled 2021-12-12: qty 2

## 2021-12-12 MED ORDER — MIDAZOLAM HCL 5 MG/5ML IJ SOLN
INTRAMUSCULAR | Status: DC | PRN
  Administered 2021-12-12: 2 mg via INTRAVENOUS

## 2021-12-12 MED ORDER — FENTANYL CITRATE (PF) 100 MCG/2ML IJ SOLN
INTRAMUSCULAR | Status: AC
  Administered 2021-12-12: 50 ug via INTRAVENOUS
  Filled 2021-12-12: qty 2

## 2021-12-12 MED ORDER — SODIUM CHLORIDE 0.9% FLUSH: 9.0000 mL | INTRAVENOUS | Status: DC | PRN

## 2021-12-12 MED ORDER — DEXTROSE IN LACTATED RINGERS 5 % IV SOLN: INTRAVENOUS | Status: DC

## 2021-12-12 MED ORDER — CEFAZOLIN SODIUM-DEXTROSE 2-4 GM/100ML-% IV SOLN
INTRAVENOUS | Status: AC
  Filled 2021-12-12: qty 100

## 2021-12-12 MED ORDER — ONDANSETRON HCL 4 MG/2ML IJ SOLN
INTRAMUSCULAR | Status: DC | PRN
  Administered 2021-12-12: 4 mg via INTRAVENOUS

## 2021-12-12 MED ORDER — FENTANYL CITRATE (PF) 250 MCG/5ML IJ SOLN
INTRAMUSCULAR | Status: AC
  Filled 2021-12-12: qty 5

## 2021-12-12 MED ORDER — PROMETHAZINE HCL 25 MG/ML IJ SOLN: 6.2500 mg | INTRAMUSCULAR | Status: DC | PRN

## 2021-12-12 MED ORDER — BUPIVACAINE HCL (PF) 0.25 % IJ SOLN
INTRAMUSCULAR | Status: AC
  Filled 2021-12-12: qty 30

## 2021-12-12 MED ORDER — PROPOFOL 10 MG/ML IV BOLUS
INTRAVENOUS | Status: AC
  Filled 2021-12-12: qty 20

## 2021-12-12 MED ORDER — SODIUM CHLORIDE (PF) 0.9 % IJ SOLN
INTRAMUSCULAR | Status: AC
  Filled 2021-12-12: qty 100

## 2021-12-12 MED ORDER — ROCURONIUM BROMIDE 10 MG/ML (PF) SYRINGE
PREFILLED_SYRINGE | INTRAVENOUS | Status: DC | PRN
  Administered 2021-12-12: 30 mg via INTRAVENOUS
  Administered 2021-12-12: 20 mg via INTRAVENOUS
  Administered 2021-12-12: 80 mg via INTRAVENOUS
  Administered 2021-12-12: 30 mg via INTRAVENOUS
  Administered 2021-12-12: 10 mg via INTRAVENOUS
  Administered 2021-12-12: 20 mg via INTRAVENOUS

## 2021-12-12 MED ORDER — SODIUM CHLORIDE 0.9 % IV SOLN: 10.0000 mL/h | Freq: Once | INTRAVENOUS | Status: AC

## 2021-12-12 MED ORDER — KETOROLAC TROMETHAMINE 30 MG/ML IJ SOLN
INTRAMUSCULAR | Status: AC
  Filled 2021-12-12: qty 1

## 2021-12-12 MED ORDER — IBUPROFEN 600 MG PO TABS
600.0000 mg | ORAL_TABLET | Freq: Four times a day (QID) | ORAL | Status: DC
  Administered 2021-12-13 – 2021-12-14 (×7): 600 mg via ORAL
  Filled 2021-12-12 (×9): qty 1

## 2021-12-12 MED ORDER — SCOPOLAMINE 1 MG/3DAYS TD PT72
MEDICATED_PATCH | TRANSDERMAL | Status: AC
  Administered 2021-12-12: 1.5 mg via TRANSDERMAL
  Filled 2021-12-12: qty 1

## 2021-12-12 MED ORDER — LIDOCAINE 2% (20 MG/ML) 5 ML SYRINGE
INTRAMUSCULAR | Status: AC
  Filled 2021-12-12: qty 5

## 2021-12-12 MED ORDER — PHENYLEPHRINE HCL (PRESSORS) 10 MG/ML IV SOLN
INTRAVENOUS | Status: DC | PRN
  Administered 2021-12-12 (×3): 80 ug via INTRAVENOUS
  Administered 2021-12-12 (×2): 160 ug via INTRAVENOUS
  Administered 2021-12-12: 80 ug via INTRAVENOUS

## 2021-12-12 MED ORDER — AMISULPRIDE (ANTIEMETIC) 5 MG/2ML IV SOLN: 10.0000 mg | Freq: Once | INTRAVENOUS | Status: DC | PRN

## 2021-12-12 MED ORDER — SUGAMMADEX SODIUM 200 MG/2ML IV SOLN
INTRAVENOUS | Status: DC | PRN
  Administered 2021-12-12: 400 mg via INTRAVENOUS

## 2021-12-12 MED ORDER — ACETAMINOPHEN 500 MG PO TABS: 1000.0000 mg | ORAL_TABLET | Freq: Once | ORAL | Status: AC

## 2021-12-12 MED ORDER — BUPIVACAINE HCL (PF) 0.25 % IJ SOLN
INTRAMUSCULAR | Status: DC | PRN
  Administered 2021-12-12: 10 mL

## 2021-12-12 MED ORDER — MENTHOL 3 MG MT LOZG: 1.0000 | LOZENGE | OROMUCOSAL | Status: DC | PRN

## 2021-12-12 MED ORDER — HYDROMORPHONE 1 MG/ML IV SOLN
INTRAVENOUS | Status: AC
  Filled 2021-12-12: qty 30

## 2021-12-12 MED ORDER — CHLORHEXIDINE GLUCONATE 0.12 % MT SOLN
OROMUCOSAL | Status: AC
  Administered 2021-12-12: 15 mL via OROMUCOSAL
  Filled 2021-12-12: qty 15

## 2021-12-12 MED ORDER — ACETAMINOPHEN 500 MG PO TABS
ORAL_TABLET | ORAL | Status: AC
  Administered 2021-12-12: 1000 mg via ORAL
  Filled 2021-12-12: qty 2

## 2021-12-12 MED ORDER — DEXAMETHASONE SODIUM PHOSPHATE 10 MG/ML IJ SOLN
INTRAMUSCULAR | Status: AC
  Filled 2021-12-12: qty 1

## 2021-12-12 MED ORDER — PROPOFOL 10 MG/ML IV BOLUS
INTRAVENOUS | Status: DC | PRN
  Administered 2021-12-12: 150 mg via INTRAVENOUS

## 2021-12-12 MED ORDER — DEXAMETHASONE SODIUM PHOSPHATE 10 MG/ML IJ SOLN
INTRAMUSCULAR | Status: DC | PRN
  Administered 2021-12-12: 10 mg via INTRAVENOUS

## 2021-12-12 SURGICAL SUPPLY — 63 items
APL SKNCLS STERI-STRIP NONHPOA (GAUZE/BANDAGES/DRESSINGS) ×2
BAG COUNTER SPONGE SURGICOUNT (BAG) ×2 IMPLANT
BAG SPNG CNTER NS LX DISP (BAG) ×2
BARRIER ADHS 3X4 INTERCEED (GAUZE/BANDAGES/DRESSINGS) ×2 IMPLANT
BENZOIN TINCTURE PRP APPL 2/3 (GAUZE/BANDAGES/DRESSINGS) ×2 IMPLANT
BRR ADH 4X3 ABS CNTRL BYND (GAUZE/BANDAGES/DRESSINGS) ×2
CANISTER SUCT 3000ML PPV (MISCELLANEOUS) ×2 IMPLANT
CANISTER WOUNDNEG PRESSURE 500 (CANNISTER) IMPLANT
CNTNR URN SCR LID CUP LEK RST (MISCELLANEOUS) IMPLANT
CONT PATH 16OZ SNAP LID 3702 (MISCELLANEOUS) ×2 IMPLANT
CONT SPEC 4OZ STRL OR WHT (MISCELLANEOUS) ×2
CONT SPEC PATH 64OZ SNAP LID (MISCELLANEOUS) ×2 IMPLANT
DRAPE CESAREAN BIRTH W POUCH (DRAPES) ×2 IMPLANT
DRAPE WARM FLUID 44X44 (DRAPES) ×2 IMPLANT
DRESSING PEEL AND PLAC PRVNA20 (GAUZE/BANDAGES/DRESSINGS) IMPLANT
DRSG OPSITE POSTOP 4X10 (GAUZE/BANDAGES/DRESSINGS) ×2 IMPLANT
DRSG PEEL AND PLACE PREVENA 20 (GAUZE/BANDAGES/DRESSINGS) ×2
DURAPREP 26ML APPLICATOR (WOUND CARE) ×2 IMPLANT
ELECT NDL TIP 2.8 STRL (NEEDLE) ×2 IMPLANT
ELECT NEEDLE TIP 2.8 STRL (NEEDLE) ×2 IMPLANT
GAUZE 4X4 16PLY ~~LOC~~+RFID DBL (SPONGE) IMPLANT
GLOVE BIOGEL PI IND STRL 7.0 (GLOVE) ×6 IMPLANT
GLOVE ECLIPSE 6.5 STRL STRAW (GLOVE) ×2 IMPLANT
GOWN STRL REUS W/ TWL LRG LVL3 (GOWN DISPOSABLE) ×6 IMPLANT
GOWN STRL REUS W/TWL LRG LVL3 (GOWN DISPOSABLE) ×6
KIT DRSG PREVENA PLUS 7DAY 125 (MISCELLANEOUS) IMPLANT
KIT TURNOVER KIT B (KITS) ×2 IMPLANT
LIGASURE IMPACT 36 18CM CVD LR (INSTRUMENTS) IMPLANT
NDL SPNL 22GX3.5 QUINCKE BK (NEEDLE) IMPLANT
NEEDLE HYPO 22GX1.5 SAFETY (NEEDLE) ×2 IMPLANT
NEEDLE SPNL 22GX3.5 QUINCKE BK (NEEDLE) ×2 IMPLANT
NS IRRIG 1000ML POUR BTL (IV SOLUTION) ×2 IMPLANT
PACK ABDOMINAL GYN (CUSTOM PROCEDURE TRAY) ×2 IMPLANT
PAD ARMBOARD 7.5X6 YLW CONV (MISCELLANEOUS) ×2 IMPLANT
PAD OB MATERNITY 4.3X12.25 (PERSONAL CARE ITEMS) ×2 IMPLANT
POWDER SURGICEL 3.0 GRAM (HEMOSTASIS) IMPLANT
PROTECTOR NERVE ULNAR (MISCELLANEOUS) ×2 IMPLANT
RETRACTOR TRAXI PANNICULUS (MISCELLANEOUS) IMPLANT
RTRCTR C-SECT PINK 25CM LRG (MISCELLANEOUS) ×2 IMPLANT
SPONGE T-LAP 18X18 ~~LOC~~+RFID (SPONGE) ×4 IMPLANT
STRIP CLOSURE SKIN 1/2X4 (GAUZE/BANDAGES/DRESSINGS) ×2 IMPLANT
SUT MON AB 2-0 SH 27 (SUTURE)
SUT MON AB 2-0 SH27 (SUTURE) IMPLANT
SUT MON AB 3-0 SH 27 (SUTURE) ×8
SUT MON AB 3-0 SH27 (SUTURE) ×6 IMPLANT
SUT MON AB 4-0 PS1 27 (SUTURE) ×2 IMPLANT
SUT PLAIN 2 0 XLH (SUTURE) IMPLANT
SUT VIC AB 0 CT1 18XCR BRD8 (SUTURE) ×2 IMPLANT
SUT VIC AB 0 CT1 27 (SUTURE) ×12
SUT VIC AB 0 CT1 27XBRD ANBCTR (SUTURE) IMPLANT
SUT VIC AB 0 CT1 36 (SUTURE) ×4 IMPLANT
SUT VIC AB 0 CT1 8-18 (SUTURE) ×2
SUT VIC AB 2-0 CT1 27 (SUTURE) ×2
SUT VIC AB 2-0 CT1 TAPERPNT 27 (SUTURE) ×2 IMPLANT
SUT VIC AB 2-0 SH 27 (SUTURE)
SUT VIC AB 2-0 SH 27XBRD (SUTURE) IMPLANT
SUT VIC AB 3-0 SH 27 (SUTURE) ×2
SUT VIC AB 3-0 SH 27X BRD (SUTURE) IMPLANT
SUT VICRYL 4-0 PS2 18IN ABS (SUTURE) IMPLANT
SYR CONTROL 10ML LL (SYRINGE) ×4 IMPLANT
TOWEL GREEN STERILE FF (TOWEL DISPOSABLE) ×4 IMPLANT
TRAXI PANNICULUS RETRACTOR (MISCELLANEOUS) ×2
TRAY FOLEY W/BAG SLVR 14FR (SET/KITS/TRAYS/PACK) ×2 IMPLANT

## 2021-12-12 NOTE — Anesthesia Procedure Notes (Signed)
Procedure Name: Intubation Date/Time: 12/12/2021 3:15 PM  Performed by: Maude Leriche, CRNAPre-anesthesia Checklist: Patient identified, Emergency Drugs available, Suction available and Patient being monitored Patient Re-evaluated:Patient Re-evaluated prior to induction Oxygen Delivery Method: Circle system utilized Preoxygenation: Pre-oxygenation with 100% oxygen Induction Type: IV induction Ventilation: Mask ventilation without difficulty Laryngoscope Size: Miller and 2 Grade View: Grade I Tube type: Oral Tube size: 7.0 mm Number of attempts: 1 Airway Equipment and Method: Stylet and Bite block Placement Confirmation: ETT inserted through vocal cords under direct vision, positive ETCO2 and breath sounds checked- equal and bilateral Secured at: 21 cm Tube secured with: Tape Dental Injury: Teeth and Oropharynx as per pre-operative assessment

## 2021-12-12 NOTE — Brief Op Note (Signed)
12/12/2021  2:82 PM  PATIENT:  [name] [name]  36 y.o. female  PRE-OPERATIVE DIAGNOSIS:  uterine fibroids, lost IUD string  POST-OPERATIVE DIAGNOSIS:  SS/IM/SM  uterine fibroids, lost IUD string, acute blood loss related anemia  PROCEDURE:  Procedure(s): EXPLORATORY LAPAROTOMY (N/A) ABDOMINAL MYOMECTOMY (N/A) INTRAUTERINE DEVICE (IUD) REMOVAL (N/A) OPEN LEFT SALPINGO OOPHORECTOMY (Left)  SURGEON:  Surgeon(s) and Role:    * Servando Salina, MD - Primary  PHYSICIAN ASSISTANT:   ASSISTANTS: Charlestine Massed RNFA   ANESTHESIA:   general FINDINGS; 12-13 cm post LUS  SS fibroids, multiple  IM /SM fibroids, IUD seen when cavity entered, omental adhesions to ant abd wall. Nl right tube and ovary, left ovary plastered to left posterior fundal uterus, distorted left tube, nl appendix, nl liver edge, kidneys EBL:  1000 mL   BLOOD ADMINISTERED: 2 units CC PRBC  DRAINS: none   LOCAL MEDICATIONS USED:  MARCAINE     SPECIMEN:  Source of Specimen:  myomas, left tube and ovary  DISPOSITION OF SPECIMEN:  PATHOLOGY  COUNTS:  YES  TOURNIQUET:  * No tourniquets in log *  DICTATION: .Other Dictation: Dictation Number [id number]  PLAN OF CARE: Admit to inpatient   PATIENT DISPOSITION:  PACU - hemodynamically stable.   Delay start of Pharmacological VTE agent (>24hrs) due to surgical blood loss or risk of bleeding: no

## 2021-12-12 NOTE — Transfer of Care (Signed)
Immediate Anesthesia Transfer of Care Note  Patient: [name] [name]  Procedure(s) Performed: EXPLORATORY LAPAROTOMY ABDOMINAL MYOMECTOMY INTRAUTERINE DEVICE (IUD) REMOVAL OPEN LEFT SALPINGO OOPHORECTOMY (Left)  Patient Location: PACU  Anesthesia Type:General  Level of Consciousness: awake, drowsy and patient cooperative  Airway & Oxygen Therapy: Patient Spontanous Breathing  Post-op Assessment: Report given to RN and Post -op Vital signs reviewed and stable  Post vital signs: Reviewed and stable  Last Vitals:  Vitals Value Taken Time  BP 115/64 12/12/21 1937  Temp    Pulse 84 12/12/21 1940  Resp 19 12/12/21 1940  SpO2 97 % 12/12/21 1940  Vitals shown include unvalidated device data.  Last Pain:  Vitals:   12/12/21 1333  TempSrc:   PainSc: 0-No pain      Patients Stated Pain Goal: 0 (84/21/03 1281)  Complications: No notable events documented.

## 2021-12-12 NOTE — H&P (Signed)
[name] [name] is an 36 y.o. female.  G2P1011 MBF presents for surgical management of uterine fibroid&   lost IUD. Pt underwent MRI of abdomen and pelvis 06/18/2021 where large 13 cm degenerating submucosal fibroid in posterior LUS was noted  in a 19 cm fibroid uterus. Pt desires to maintain fertility. Previous had myomectomy done 12/01/2017 where this posterior fibroid could not be displaced and a fundal 8 cm fibroid was removed  Pertinent Gynecological History: Menses:  IUD Bleeding: n/a Contraception: IUD DES exposure: denies Blood transfusions: none Sexually transmitted diseases: no past history Previous GYN Procedures: DNC  Last mammogram:  n/a  Date: n/a Last pap: normal Date: 2023 OB History: G2, P1011   Menstrual History: Menarche age: n/a No LMP recorded (lmp unknown). (Menstrual status: IUD).    Past Medical History:  Diagnosis Date   Allergic rhinitis    Anginal pain (HCC)    hx of (pt had cardiac work-up and everything was normal)   Bronchitis    Dyspnea    3 times a week   Headache    History of hiatal hernia    Hyperthyroidism    Neuromuscular disorder ([place])    carpel tunnel left hand   Pre-diabetes    no meds   SVD (spontaneous vaginal delivery) 2004   x 1    Past Surgical History:  Procedure Laterality Date   DILATION AND CURETTAGE OF UTERUS     termination x 1   MYOMECTOMY N/A 12/01/2017   Procedure: MYOMECTOMY;  Surgeon: Christophe Louis, MD;  Location: [facility];  Service: Gynecology;  Laterality: N/A;   WISDOM TOOTH EXTRACTION      Family History  Problem Relation Age of Onset   Heart disease Father    Thyroid disease Neg Hx     Social History:  reports that she quit smoking about 4 years ago. Her smoking use included cigars and cigarettes. She has a 4.25 pack-year smoking history. She has never used smokeless tobacco. She reports current alcohol use. She reports that she does not use drugs.  Allergies: No Known Allergies  No medications prior to  admission.    Review of Systems  All other systems reviewed and are negative.   Height '5\' 5"'$  (1.651 m), weight 106.6 kg. Physical Exam Constitutional:      Appearance: Normal appearance. She is obese.  HENT:     Head: Atraumatic.     Mouth/Throat:     Mouth: Mucous membranes are moist.  Eyes:     Extraocular Movements: Extraocular movements intact.  Cardiovascular:     Rate and Rhythm: Regular rhythm.     Heart sounds: Normal heart sounds.  Pulmonary:     Breath sounds: Normal breath sounds.  Abdominal:     Comments: Soft . Pfannensteil skin incision.  Palp mass ( suprapubic) 3 FB  Genitourinary:    General: Normal vulva.     Comments: Vagina nl Large posterior cul de sac mass deviated to the left Cervix displaced ant Uterus enlarged  Adnexa nonpalpable Musculoskeletal:        General: Normal range of motion.     Cervical back: Neck supple.  Skin:    General: Skin is warm and dry.  Neurological:     General: No focal deficit present.     Mental Status: She is alert and oriented to person, place, and time.  Psychiatric:        Mood and Affect: Mood normal.        Behavior:  Behavior normal.     No results found for this or any previous visit (from the past 24 hour(s)).  No results found.  Assessment/Plan: Large Fibroid uterus with impacted posterior SM/IM  LUS fibroid Lost IUD hyperthyroidism P) exp lap, myomectomy, removal of IUD Procedure explained. Risk of surgery reviewed including infection, bleeding, possible need for blood transfusion and its risk( HIV, hepatitis, acute rxn), internal scar tissue, need for C/S in the future, infertility due to scarring, inability to remove fibroid, 2% chance of hysterectomy.  2) remove overdue IUD  All ? answered  Malavika Lira A Kataleyah Carducci 12/12/2021, 4:27 AM

## 2021-12-12 NOTE — Interval H&P Note (Signed)
History and Physical Interval Note:  7/53/3917 9:21 PM  [name] [name]  has presented today for surgery, with the diagnosis of uterine fibroids, lost IUD string.  The various methods of treatment have been discussed with the patient and family. After consideration of risks, benefits and other options for treatment, the patient has consented to  Procedure(s): EXPLORATORY LAPAROTOMY (N/A) ABDOMINAL MYOMECTOMY (N/A) INTRAUTERINE DEVICE (IUD) REMOVAL (N/A) as a surgical intervention.  The patient's history has been reviewed, patient examined, no change in status, stable for surgery.  I have reviewed the patient's chart and labs.  Questions were answered to the patient's satisfaction.     Chanz Cahall A Londynn Sonoda

## 2021-12-12 NOTE — Anesthesia Preprocedure Evaluation (Addendum)
Anesthesia Evaluation  Patient identified by MRN, date of birth, ID band Patient awake    Reviewed: Allergy & Precautions, NPO status , Patient's Chart, lab work & pertinent test results  History of Anesthesia Complications Negative for: history of anesthetic complications  Airway Mallampati: II  TM Distance: >3 FB Neck ROM: Full    Dental no notable dental hx. (+) Dental Advisory Given   Pulmonary neg pulmonary ROS, former smoker,    Pulmonary exam normal        Cardiovascular negative cardio ROS Normal cardiovascular exam     Neuro/Psych negative neurological ROS     GI/Hepatic Neg liver ROS, hiatal hernia,   Endo/Other  Hyperthyroidism   Renal/GU negative Renal ROS     Musculoskeletal negative musculoskeletal ROS (+)   Abdominal   Peds  Hematology negative hematology ROS (+)   Anesthesia Other Findings   Reproductive/Obstetrics                            Anesthesia Physical Anesthesia Plan  ASA: 2  Anesthesia Plan: General   Post-op Pain Management: Tylenol PO (pre-op)*   Induction: Intravenous  PONV Risk Score and Plan: 4 or greater and Ondansetron, Dexamethasone, Midazolam and Scopolamine patch - Pre-op  Airway Management Planned: Oral ETT  Additional Equipment:   Intra-op Plan:   Post-operative Plan: Extubation in OR  Informed Consent: I have reviewed the patients History and Physical, chart, labs and discussed the procedure including the risks, benefits and alternatives for the proposed anesthesia with the patient or authorized representative who has indicated his/her understanding and acceptance.     Dental advisory given  Plan Discussed with: Anesthesiologist and CRNA  Anesthesia Plan Comments:        Anesthesia Quick Evaluation

## 2021-12-13 ENCOUNTER — Encounter (HOSPITAL_COMMUNITY): Payer: Self-pay | Admitting: Obstetrics and Gynecology

## 2021-12-13 LAB — TYPE AND SCREEN
ABO/RH(D): O POS
Antibody Screen: NEGATIVE
Unit division: 0
Unit division: 0

## 2021-12-13 LAB — CBC
HCT: 34.5 % — ABNORMAL LOW (ref 36.0–46.0)
Hemoglobin: 11.7 g/dL — ABNORMAL LOW (ref 12.0–15.0)
MCH: 29.3 pg (ref 26.0–34.0)
MCHC: 33.9 g/dL (ref 30.0–36.0)
MCV: 86.3 fL (ref 80.0–100.0)
Platelets: 270 10*3/uL (ref 150–400)
RBC: 4 MIL/uL (ref 3.87–5.11)
RDW: 14.1 % (ref 11.5–15.5)
WBC: 12.4 10*3/uL — ABNORMAL HIGH (ref 4.0–10.5)
nRBC: 0 % (ref 0.0–0.2)

## 2021-12-13 LAB — BPAM RBC
Blood Product Expiration Date: 202310312359
Blood Product Expiration Date: 202310312359
ISSUE DATE / TIME: 202309291822
ISSUE DATE / TIME: 202309291822
Unit Type and Rh: 5100
Unit Type and Rh: 5100

## 2021-12-13 LAB — HEMOGLOBIN AND HEMATOCRIT, BLOOD
HCT: 36.3 % (ref 36.0–46.0)
Hemoglobin: 12.5 g/dL (ref 12.0–15.0)

## 2021-12-13 NOTE — Anesthesia Postprocedure Evaluation (Signed)
Anesthesia Post Note  Patient: [name] [name]  Procedure(s) Performed: EXPLORATORY LAPAROTOMY ABDOMINAL MYOMECTOMY INTRAUTERINE DEVICE (IUD) REMOVAL OPEN LEFT SALPINGO OOPHORECTOMY (Left)     Patient location during evaluation: PACU Anesthesia Type: General Level of consciousness: awake and alert Pain management: pain level controlled Vital Signs Assessment: post-procedure vital signs reviewed and stable Respiratory status: spontaneous breathing, nonlabored ventilation, respiratory function stable and patient connected to nasal cannula oxygen Cardiovascular status: blood pressure returned to baseline and stable Postop Assessment: no apparent nausea or vomiting Anesthetic complications: no   No notable events documented.  Last Vitals:  Vitals:   12/13/21 0044 12/13/21 0113  BP: 106/65   Pulse: 75   Resp:  18  Temp: 36.7 C   SpO2: 95% 95%    Last Pain:  Vitals:   12/13/21 0113  TempSrc:   PainSc: Asleep                 March Rummage Cheral Cappucci

## 2021-12-13 NOTE — Op Note (Signed)
NAME: [name] [name], [name] [name] MEDICAL RECORD NO: [id number] ACCOUNT NO: [id number] DATE OF BIRTH: [birth year] FACILITY: [facility] LOCATION: [facility] PHYSICIAN: Jodell Weitman A. Garwin Brothers, MD  Operative Report   DATE OF PROCEDURE: 12/12/2021  PREOPERATIVE DIAGNOSES:  Uterine fibroids, lost IUD string.  PROCEDURE:  Exploratory laparotomy, multiple myomectomy, removal of IUD, left salpingo-oophorectomy.  POSTOPERATIVE DIAGNOSES:  Intramural, submucosal, subserosal uterine fibroids; lost IUD string; acute blood loss  anemia.  ANESTHESIA:  General.  SURGEON:  Farryn Linares A. Garwin Brothers, MD  ASSISTANT:  Gaylord Shih, RNFA  DESCRIPTION OF PROCEDURE:  Under adequate general anesthesia, the patient was placed in the supine position.  Examination under anesthesia revealed a cervix that was in intraabdominal location due to a large posterior fibroid filling the posterior  cul-de-sac and the uterus extending up to the level of the umbilicus.  The patient had a previous myomectomy with inability to remove that fibroid.  The patient was sterilely prepped and draped in the usual fashion.  Due to her pannus, a traxi retractor  was placed on the abdomen. 0.25% Marcaine was injected along the previous Pfannenstiel skin incision.  Pfannenstiel skin incision was then made, carried down to the rectus fascia.  Rectus fascia opened transversely.  The rectus fascia was then bluntly  and sharply dissected off the rectus muscle in a superior and inferior fashion.  The rectus muscle was sharply opened in the midline with subsequent finding of omental adhesions to the anterior abdominal wall.  With carefully opening of the peritoneum,  the omental adhesions were lysed in order to further facilitate extending the parietal peritoneum entry. Under direct visualization, the fascia was further dissected superiorly in order to gain access.  Once the omental adhesions were lysed, we then  noted that the uterus on palpation was sitting on top of a  large lower uterine segment posterior subserosal fibroid  deep in the posterior cul-de-sac fibroid. The right tube and ovary could not be able to seen. The left tube and ovary was distorted in anatomy with the left ovary adherent to the  posterior fundal portion with a bleb of fluid-filled material adjacent to it.  The fallopian tube was also twisted on that left side. An Alexis self-retaining retractor was then placed.  The bowel was gently displaced  upwardly and packed. The procedure was started with carefully bringing up the uterus, which contained an anterior intramural and subserosal fibroid, multiple posterior fundal palpable fibroids as well.  Once the upper part of the this large fibroid posteriorly was  identified, a dilute solution of Pitressin was injected overlying the surface of that fibroid proximally. A transverse incision was made over the serosal surface of the fibroid and the incision was carried down to the fibroid was reached, at which time the enucleation was  started with towel clamps  and single tooth tenaculum being placed on the fibroid and pulling with countertraction and blunt dissection of the fibroid, the fibroid was brought up out of the pelvis and ultimately a vacuum noise was subsequently heard where the fibroid finally was brought up from below  the sacral promontory posteriorly.  This allowed for better visualization and performance of the myomectomy.  Posteriorly, there were superficial adhesions underneath the fibroid and with the needlepoint cautery, these adhesions were carefully removed because of the superficial vessels notes in the serosal surface. The remaining portion of the fibroid was then enucleated in the usual manner with the majority of the fibroid was really subserosal in its location, with the portion that was intramural was  carefully dissected.  Through that incision, other  fibroids were removed and the opportunity was then utilized because of that incision  to take care of the additional posterior fundal fibroid that was present, taking care to find that we had the location of the round ligament and both fallopian tubes  location to avoid making an incision across the top of the fundus.  Additional Pitressin was injected into those fibroids and the incision extended to allow for removal of the additional fibroids. In the process of doing that, the endometrial cavity was  entered in part as the arm of the IUD was then seen and the IUD was then removed.  The procedure was continued by removing as many fibroids through that posterior incision.  Once that felt to be done, the fibroid defect space was closed with  interrupted 0 Vicryl figure-of-eight sutures until the serosal surface was reached, at which time it was then closed using 3-0 Monocryl suture in a semi-baseball fashion.  Bleeding at that point was started to be encountered, particularly in the  posterior cul-de-sac area and the surface of where the fibroid had been previously, which was thought to be just the surface bleeding; however, continued sponges were placed in the cul-de-sac to allow for tamponade because these were small bleeders that  could not be fully cauterized.  Bleeding as the rest of the areas being inspected, there was what would be the mesosalpinx area of the left tube was bleeding.  3-0 Monocryl suture was carefully used to do a running stitch on the peritoneal area  of that tube. Underlying deep mattress sutures was also placed and continued bleeding was noted.  In addition, surface area of overlying tissue that had been part of the fibroid posteriorly was also noted and was approximated using the 3-0 Monocryl  suture as well posteriorly.  Bleeding on the left lateral wall was also clamped, free tied with Monocryl and when bleeding was felt to have been at that point it was stable, attention was then turned to the anterior fibroid that was present.  A Pitressin  was injected into the  fibroid and a vertical incision was made.  The fibroid was also enucleated from its base.  The defect was closed in the same fashion using 0 Vicryl figure-of-eight sutures to the serosal surface, which was then closed in a baseball  fashion using 3-0 Monocryl suture.  The packings were removed that were soaked.  We continued looking on the side towards the promontory where the bowels were, bringing up the bowels as well, looking for any areas of bleeding was continued and it was  just diffuse bleeding at that point. Intraoperatively, a CBC was requested, which came back with H and H of  9 or something which did not correlate with the amount of bleeding noted  The pelvis was irrigated, suctioned. Careful inspection was then continued to be  done to check for any cause of bleeding . A pumping vessel was identified and that was clamped and sutured, free tied and cauterized as well. When it was felt that we had the bleeding controlled, Surgicel powder was then injected over  all and then waited for the appropriate time and then the pelvis was irrigated.  While this was being done, we waited to check to make sure the hemostasis had been achieved.  It was then noted that bleeding again started on the left adnexa.  Additional  sutures were placed.  Bleeding still continued.  At that point, the  decision was then made to at least try to take the tube, which was the main bleeder; however, the process of freeing up the adhesions around the tube and taking off the ovary that had  been adherent to the left posterior fundal area, bleeding in the mesovarium area started as well that could not be hemostasis without compromising the blood supply to that ovary and a decision was then made to remove that left adnexa.  Using a LigaSure, serially clamped, cauterized and cut the mesosalpinx until the tube and the ovary on the left was  removed.  Some bleeding from the site where the ovary was adherent resulted in 3-0 Monocryl  sutures being placed in that site.  Hemostasis was then achieved and pelvis again was inspected and Interceed was placed over fundal region anteriorly and  posteriorly.  The omentum was inspected and a small bleeder was also clamped, free tied. 2-0 Vicryl suture was then used to close the peritoneum.  The rectus fascia was closed with 0 Vicryl x2.  The subcutaneous area, which was about inch and a half  deep was  closed with interrupted 2-0 plain sutures after small bleeders cauterized and 4-0 Vicryl subcuticular closure was then placed.  A Prevena wound vacuum was placed due to the depth of that subcutaneous area.  SPECIMEN: labelled Left tube and ovary, myomas weighing 763 grams sent to Pathology.  INTRAOPERATIVE FLUIDS:  250 mL of albumin, 2 units of packed red cells, 4 liters of crystalloid.  URINE OUTPUT:  950 mL.  ESTIMATED BLOOD LOSS:  1000 ml which was probably underestimated.  COMPLICATIONS:  None.  DISPOSITION:  The patient appeared to have tolerated the procedure well and was transferred to recovery room in stable condition.   NIK D: 12/12/2021 8:52:18 pm T: 12/13/2021 2:32:00 am  JOB: [id number]/ [id number]

## 2021-12-13 NOTE — Progress Notes (Signed)
Subjective: Patient reports incisional pain.    Objective: I have reviewed patient's vital signs.  vital signs, intake and output, and labs. Vitals:   12/13/21 0433 12/13/21 0438  BP:  111/63  Pulse:  64  Resp: 12   Temp:  (!) 97.5 F (36.4 C)  SpO2: 96% 98%   I/O last 3 completed shifts: In: 72 [I.V.:3900; Blood:598; IV Piggyback:350] Out: 2876 [Urine:1550; Blood:1000] No intake/output data recorded.  Lab Results  Component Value Date   WBC 12.4 (H) 12/13/2021   HGB 11.7 (L) 12/13/2021   HCT 34.5 (L) 12/13/2021   MCV 86.3 12/13/2021   PLT 270 12/13/2021   Lab Results  Component Value Date   CREATININE 0.79 06/26/2021    EXAM General: alert, cooperative, and no distress Resp: clear to auscultation bilaterally Cardio: regular rate and rhythm, S1, S2 normal, no murmur, click, rub or gallop GI:  obese soft active bowel sounds Extremities: no edema, redness or tenderness in the calves or thighs Vaginal Bleeding: minimal  Assessment: s/p Procedure(s): EXPLORATORY LAPAROTOMY ABDOMINAL MYOMECTOMY INTRAUTERINE DEVICE (IUD) REMOVAL OPEN LEFT SALPINGO OOPHORECTOMY: stable  Plan: Advance diet Encourage ambulation Advance to PO medication Incentive spirometry D/c PCA once tolerate diet   LOS: 1 day    Marvene Staff, MD 12/13/2021 7:29 AM    12/13/2021, 7:29 AM

## 2021-12-13 NOTE — Progress Notes (Signed)
Refused SCD, encouraging her to ambulate.

## 2021-12-14 ENCOUNTER — Inpatient Hospital Stay (HOSPITAL_COMMUNITY): Payer: BC Managed Care – PPO

## 2021-12-14 MED ORDER — ALBUTEROL SULFATE (2.5 MG/3ML) 0.083% IN NEBU
2.5000 mg | INHALATION_SOLUTION | Freq: Once | RESPIRATORY_TRACT | Status: DC
  Filled 2021-12-14: qty 3

## 2021-12-14 MED ORDER — BISACODYL 10 MG RE SUPP
10.0000 mg | Freq: Once | RECTAL | Status: AC
  Administered 2021-12-14: 10 mg via RECTAL
  Filled 2021-12-14: qty 1

## 2021-12-14 MED ORDER — ALBUTEROL SULFATE HFA 108 (90 BASE) MCG/ACT IN AERS: 2.0000 | INHALATION_SPRAY | RESPIRATORY_TRACT | 3 refills | Status: DC | PRN

## 2021-12-14 MED ORDER — OXYCODONE HCL 5 MG PO TABS: 5.0000 mg | ORAL_TABLET | Freq: Four times a day (QID) | ORAL | 0 refills | Status: AC | PRN

## 2021-12-14 MED ORDER — IBUPROFEN 600 MG PO TABS: 600.0000 mg | ORAL_TABLET | Freq: Four times a day (QID) | ORAL | 11 refills | Status: DC | PRN

## 2021-12-14 MED ORDER — ALBUTEROL SULFATE HFA 108 (90 BASE) MCG/ACT IN AERS
2.0000 | INHALATION_SPRAY | RESPIRATORY_TRACT | Status: DC | PRN
  Filled 2021-12-14: qty 6.7

## 2021-12-14 NOTE — Progress Notes (Signed)
Patient continuing to request to hold off on suppository at this time, ambulating in room. This nurse educated patient on dulcolax, patient verbalized understanding. Significant other remains at bedside.

## 2021-12-14 NOTE — Progress Notes (Signed)
Subjective: Patient reports no problems voiding.  C/o wheezing and problem with catching breath. Per pt when she feels like this she uses her husband's inhaler but denies asthma. Denies n/v or passage of gas  Objective: I have reviewed patient's vital signs.  vital signs. Vitals:   12/13/21 2008 12/14/21 0536  BP: 115/65 (!) 103/51  Pulse: 80 65  Resp: 16 17  Temp: 97.8 F (36.6 C) 98.7 F (37.1 C)  SpO2: 98% 99%   I/O last 3 completed shifts: In: 500 [I.V.:500] Out: 1350 [Urine:1150; Blood:200] No intake/output data recorded.  Lab Results  Component Value Date   WBC 12.4 (H) 12/13/2021   HGB 11.7 (L) 12/13/2021   HCT 34.5 (L) 12/13/2021   MCV 86.3 12/13/2021   PLT 270 12/13/2021   Lab Results  Component Value Date   CREATININE 0.79 06/26/2021    EXAM General: alert, cooperative, and appears stated age Resp: wheezes bilaterally Cardio: regular rate and rhythm, S1, S2 normal, no murmur, click, rub or gallop GI: incision: clean, dry, and intact and soft obese (+) BS Extremities: no edema, redness or tenderness in the calves or thighs Vaginal Bleeding: minimal  Assessment: s/p Procedure(s): EXPLORATORY LAPAROTOMY ABDOMINAL MYOMECTOMY INTRAUTERINE DEVICE (IUD) REMOVAL OPEN LEFT SALPINGO OOPHORECTOMY:  stable Bilateral wheezing r/o pulmonary edema Plan: Encourage ambulation Discontinue IV fluids CXR PA and lateral Breathing treatment Dulcolax suppository   LOS: 2 days    Marvene Staff, MD 12/14/2021 9:46 AM    12/14/2021, 9:46 AM

## 2021-12-14 NOTE — Progress Notes (Signed)
Patient requesting to wait on Dulcolax suppository. Ambulating in hallway with significant other and drinking prune juice at this time.

## 2021-12-14 NOTE — Discharge Summary (Signed)
Physician Discharge Summary  Patient ID: [name] [name] MRN: [id number] DOB/AGE: 1986/02/25 36 y.o.  Admit date: 12/12/2021 Discharge date: 12/14/2021  Admission Diagnoses:  Discharge Diagnoses:  Principal Problem:   Uterine fibroid Active Problems:   S/P myomectomy   Discharged Condition: {condition:18240}  Hospital Course: ***  Consults: {consultation:18241}  Significant Diagnostic Studies: {diagnostics:18242}  Treatments: {Tx:18249}  Discharge Exam: Blood pressure 102/61, pulse 70, temperature 97.9 F (36.6 C), temperature source Oral, resp. rate 17, height '5\' 5"'$  (1.651 m), weight 106.6 kg, SpO2 98 %. {physical HENI:7782423}  Disposition: Discharge disposition: 01-Home or Self Care       Discharge Instructions     Call MD for:  persistant nausea and vomiting   Complete by: As directed    Call MD for:  severe uncontrolled pain   Complete by: As directed    Call MD for:  temperature >100.4   Complete by: As directed    Diet general   Complete by: As directed    Discharge instructions   Complete by: As directed    Call if temperature greater than equal to 100.4, nothing per vagina for 4-6 weeks or severe nausea vomiting, increased incisional pain , drainage or redness in the incision site, no straining with bowel movements, showers no bath   Discharge wound care:   Complete by: As directed    Call office for appt to remove vacuum   Leave dressing on - Keep it clean, dry, and intact until clinic visit   Complete by: As directed    May walk up steps   Complete by: As directed       Allergies as of 12/14/2021   No Known Allergies      Medication List     TAKE these medications    albuterol 108 (90 Base) MCG/ACT inhaler Commonly known as: VENTOLIN HFA Inhale 2 puffs into the lungs every 4 (four) hours as needed for wheezing or shortness of breath. What changed:  how much to take when to take this   aspirin-acetaminophen-caffeine 250-250-65 MG  tablet Commonly known as: EXCEDRIN MIGRAINE Take 1-2 tablets by mouth every 6 (six) hours as needed for headache.   fluticasone 50 MCG/ACT nasal spray Commonly known as: FLONASE Place 2 sprays into both nostrils daily. What changed:  when to take this reasons to take this   ibuprofen 600 MG tablet Commonly known as: ADVIL Take 1 tablet (600 mg total) by mouth every 6 (six) hours as needed.   levocetirizine 5 MG tablet Commonly known as: XYZAL Take 5 mg by mouth daily as needed for allergies.   methimazole 5 MG tablet Commonly known as: TAPAZOLE TAKE 1 TABLET BY MOUTH 3 TIMES A WEEK   oxyCODONE 5 MG immediate release tablet Commonly known as: Oxy IR/ROXICODONE Take 1 tablet (5 mg total) by mouth every 6 (six) hours as needed for up to 5 days for moderate pain or severe pain.   Vitamin D (Ergocalciferol) 1.25 MG (50000 UNIT) Caps capsule Commonly known as: DRISDOL Take 1 capsule (50,000 Units total) by mouth 2 (two) times a week. What changed: when to take this               Discharge Care Instructions  (From admission, onward)           Start     Ordered   12/14/21 0000  Leave dressing on - Keep it clean, dry, and intact until clinic visit        12/14/21 1642  12/14/21 0000  Discharge wound care:       Comments: Call office for appt to remove vacuum   12/14/21 1642            Follow-up Information     Servando Salina, MD. Schedule an appointment as soon as possible for a visit.   Specialty: Obstetrics and Gynecology Why: for wound vacuum removal Contact information: [address] [place] [address] [phone]                 Signed: Marvene Staff 12/14/2021, 4:56 PM

## 2021-12-14 NOTE — Progress Notes (Signed)
CXR neg Per RN, pt wants to hold off on nebulizer treatment Will order inhaler for prn use With flatus and or BM, d/c home today Ambulate and continue to use incentive spirometry

## 2021-12-14 NOTE — Progress Notes (Signed)
PCA stopped, patient pain 0/10 at this time. Significant other at bedside. Ambulation and incentive spirometer encouraged. Call bell in reach.

## 2021-12-15 NOTE — Addendum Note (Signed)
Addendum  created 12/15/21 0850 by Josephine Igo, CRNA   Order list changed, Pharmacy for encounter modified

## 2021-12-16 LAB — SURGICAL PATHOLOGY

## 2021-12-29 ENCOUNTER — Ambulatory Visit: Payer: BC Managed Care – PPO | Admitting: Nurse Practitioner

## 2022-01-05 ENCOUNTER — Encounter: Payer: Self-pay | Admitting: Nurse Practitioner

## 2022-01-05 ENCOUNTER — Ambulatory Visit (INDEPENDENT_AMBULATORY_CARE_PROVIDER_SITE_OTHER): Payer: BC Managed Care – PPO | Admitting: Nurse Practitioner

## 2022-01-05 VITALS — BP 122/68 | HR 66 | Temp 98.1°F | Ht 65.0 in | Wt 226.0 lb

## 2022-01-05 DIAGNOSIS — E0590 Thyrotoxicosis, unspecified without thyrotoxic crisis or storm: Secondary | ICD-10-CM

## 2022-01-05 DIAGNOSIS — E88819 Insulin resistance, unspecified: Secondary | ICD-10-CM | POA: Diagnosis not present

## 2022-01-05 DIAGNOSIS — Z6837 Body mass index (BMI) 37.0-37.9, adult: Secondary | ICD-10-CM

## 2022-01-05 DIAGNOSIS — E559 Vitamin D deficiency, unspecified: Secondary | ICD-10-CM | POA: Diagnosis not present

## 2022-01-05 DIAGNOSIS — E7800 Pure hypercholesterolemia, unspecified: Secondary | ICD-10-CM | POA: Diagnosis not present

## 2022-01-05 DIAGNOSIS — Z9889 Other specified postprocedural states: Secondary | ICD-10-CM

## 2022-01-05 NOTE — Patient Instructions (Signed)
Preventing Type 2 Diabetes Mellitus Type 2 diabetes, also called type 2 diabetes mellitus, is a long-term (chronic) disease that affects sugar (glucose) levels in your blood. Normally, a hormone called insulin allows glucose to enter cells in your body. The cells use glucose for energy. With type 2 diabetes, you will have one or both of these problems: Your pancreas does not make enough insulin. Cells in your body do not respond properly to insulin that your body makes (insulin resistance). Insulin resistance or lack of insulin causes extra glucose to build up in the blood instead of going into cells. As a result, high blood glucose (hyperglycemia) develops. That can cause many complications. Being overweight or obese and having an inactive (sedentary) lifestyle can increase your risk for diabetes. Type 2 diabetes can be delayed or prevented by making certain nutrition and lifestyle changes. How can this condition affect me? If you do not take steps to prevent diabetes, your blood glucose levels may keep increasing over time. Too much glucose in your blood for a long time can damage your blood vessels, heart, kidneys, nerves, and eyes. Type 2 diabetes can lead to chronic health problems and complications, such as: Heart disease. Stroke. Blindness. Kidney disease. Depression. Poor circulation in your feet and legs. In severe cases, a foot or leg may need to be surgically removed (amputated). What can increase my risk? You may be more likely to develop type 2 diabetes if you: Have type 2 diabetes in your family. Are overweight or obese. Have a sedentary lifestyle. Have insulin resistance or a history of prediabetes. Have a history of pregnancy-related (gestational) diabetes or polycystic ovary syndrome (PCOS). What actions can I take to prevent this? It can be difficult to recognize signs of type 2 diabetes. Taking action to prevent the disease before you develop symptoms is the best way to avoid  possible damage to your body. Making certain nutrition and lifestyle changes may prevent or delay the disease and related health problems. Nutrition  Eat healthy meals and snacks regularly. Do not skip meals. Fruit or a handful of nuts is a healthy snack between meals. Drink water throughout the day. Avoid drinks that contain added sugar, such as soda or sweetened tea. Drink enough fluid to keep your urine pale yellow. Follow instructions from your health care provider about eating or drinking restrictions. Limit the amount of food you eat by: Managing how much you eat at a time (portion size). Checking food labels for the serving sizes of food. Using a kitchen scale to weigh amounts of food. Saut or steam food instead of frying it. Cook with water or broth instead of oils or butter. Limit saturated fat and salt (sodium) in your diet. Have no more than 1 tsp (2,400 mg) of sodium a day. If you have heart disease or high blood pressure, use less than ? tsp (1,500 mg) of sodium a day. Lifestyle  Lose weight if needed and as told. Your health care provider can determine how much weight loss is best for you and can help you lose weight safely. If you are overweight or obese, you may be told to lose at least 5?7% of your body weight. Manage blood pressure, cholesterol, and stress. Your health care provider will help determine the best treatment for you. Do not use any products that contain nicotine or tobacco. These products include cigarettes, chewing tobacco, and vaping devices, such as e-cigarettes. If you need help quitting, ask your health care provider. Activity  Do physical   activity that makes your heart beat faster and makes you sweat (moderate intensity). Do this for at least 30 minutes on at least 5 days of the week, or as much as told by your health care provider. Ask your health care provider what activities are safe for you. A mix of activities may be best, such as walking, swimming,  cycling, and strength training. Try to add physical activity into your day. For example: Park your car farther away than usual so that you walk more. Take a walk during your lunch break. Use stairs instead of elevators or escalators. Walk or bike to work instead of driving. Alcohol use If you drink alcohol: Limit how much you have to: 0?1 drink a day for women who are not pregnant. 0?2 drinks a day for men. Know how much alcohol is in your drink. In the [place] one drink equals one 12 oz bottle of beer (355 mL), one 5 oz glass of wine (148 mL), or one 1 oz glass of hard liquor (44 mL). General information Talk with your health care provider about your risk factors and how you can reduce your risk for diabetes. Have your blood glucose tested regularly, as told by your health care provider. Get screening tests as told by your health care provider. You may have these regularly, especially if you have certain risk factors for type 2 diabetes. Make an appointment with a registered dietitian. This diet and nutrition specialist can help you make a healthy eating plan and help you understand portion sizes and food labels. Where to find support Ask your health care provider to recommend a registered dietitian, a certified diabetes care and education specialist, or a weight loss program. Look for local or online weight loss groups. Join a gym, fitness club, or outdoor activity group, such as a walking club. Where to find more information For help and guidance and to learn more about diabetes and diabetes prevention, visit: American Diabetes Association (ADA): www.diabetes.org National Institute of Diabetes and Digestive and Kidney Diseases: www.niddk.nih.gov To learn more about healthy eating, visit: [place] Department of Agriculture (USDA): www.choosemyplate.gov Office of Disease Prevention and Health Promotion (ODPHP): health.gov Summary You can delay or prevent type 2 diabetes by eating healthy  foods, losing weight if needed, and increasing your physical activity. Talk with your health care provider about your risk factors for type 2 diabetes and how you can reduce your risk. It can be difficult to recognize the signs of type 2 diabetes. The best way to avoid possible damage to your body is to take action to prevent the disease before you develop symptoms. Get screening tests as told by your health care provider. This information is not intended to replace advice given to you by your health care provider. Make sure you discuss any questions you have with your health care provider. Document Revised: 05/27/2020 Document Reviewed: 05/27/2020 Elsevier Patient Education  2023 [employer]  

## 2022-01-05 NOTE — Progress Notes (Signed)
I,[name] [name],acting as a Education administrator for [employer], FNP.,have documented all relevant documentation on the behalf of [name] Brine, FNP,as directed by  [name] Brine, FNP while in the presence of [name] [name], [name] [name].  Subjective:     Patient ID: [name] [name] , female    DOB: Jun 29, 1985 , 36 y.o.   MRN: [id number]   Chief Complaint  Patient presents with   Prediabetes    HPI  Patient presents today for prediabetes follow up.   Wt Readings from Last 3 Encounters: 01/05/22 : 226 lb (102.5 kg) 12/12/21 : 235 lb (106.6 kg) 11/21/21 : 228 lb (103.4 kg)  She had surgery 3 weeks ago for myomectomy. She had 2 blood transfusions and had her left tube and left ovary - was connected to her fibroid. She has one daughter - 90 y/o - who has recently had a baby one week ago. Not currently exercising. She is not doing well with her diet; eats increased amount of bad foods. She does brown rice or long grain rice. She is back at work due to working from home. Next appt is in 3 more weeks. She had 14 fibroids removed recently.      Past Medical History:  Diagnosis Date   Allergic rhinitis    Anginal pain (HCC)    hx of (pt had cardiac work-up and everything was normal)   Bronchitis    Dyspnea    3 times a week   Headache    History of hiatal hernia    Hyperthyroidism    Neuromuscular disorder (HCC)    carpel tunnel left hand   Pre-diabetes    no meds   SVD (spontaneous vaginal delivery) 2004   x 1     Family History  Problem Relation Age of Onset   Heart disease Father    Thyroid disease Neg Hx      Current Outpatient Medications:    albuterol (VENTOLIN HFA) 108 (90 Base) MCG/ACT inhaler, Inhale 2 puffs into the lungs every 4 (four) hours as needed for wheezing or shortness of breath., Disp: 1 each, Rfl: 3   aspirin-acetaminophen-caffeine (EXCEDRIN MIGRAINE) 250-250-65 MG tablet, Take 1-2 tablets by mouth every 6 (six) hours as needed for headache., Disp: , Rfl:    fluticasone  (FLONASE) 50 MCG/ACT nasal spray, Place 2 sprays into both nostrils daily. (Patient taking differently: Place 2 sprays into both nostrils daily as needed for allergies.), Disp: 1 g, Rfl: 0   ibuprofen (ADVIL) 600 MG tablet, Take 1 tablet (600 mg total) by mouth every 6 (six) hours as needed., Disp: 30 tablet, Rfl: 11   levocetirizine (XYZAL) 5 MG tablet, Take 5 mg by mouth daily as needed for allergies., Disp: , Rfl:    methimazole (TAPAZOLE) 5 MG tablet, TAKE 1 TABLET BY MOUTH 3 TIMES A WEEK, Disp: 40 tablet, Rfl: 3   Vitamin D, Ergocalciferol, (DRISDOL) 1.25 MG (50000 UNIT) CAPS capsule, Take 1 capsule (50,000 Units total) by mouth 2 (two) times a week. (Patient taking differently: Take 50,000 Units by mouth every 14 (fourteen) days.), Disp: 24 capsule, Rfl: 1   No Known Allergies   Review of Systems  Constitutional: Negative.   Respiratory: Negative.    Cardiovascular: Negative.   Gastrointestinal: Negative.   Neurological: Negative.   Psychiatric/Behavioral: Negative.       Today's Vitals   01/05/22 1556  BP: 122/68  Pulse: 66  Temp: 98.1 F (36.7 C)  TempSrc: Oral  Weight: 226 lb (102.5 kg)  Height:  $'5\' 5"'A$  (1.651 m)   Body mass index is 37.61 kg/m.  Wt Readings from Last 3 Encounters:  01/05/22 226 lb (102.5 kg)  12/12/21 235 lb (106.6 kg)  11/21/21 228 lb (103.4 kg)    Objective:  Physical Exam Vitals reviewed.  Constitutional:      General: She is not in acute distress.    Appearance: Normal appearance. She is obese.  Cardiovascular:     Rate and Rhythm: Normal rate and regular rhythm.     Pulses: Normal pulses.     Heart sounds: Normal heart sounds. No murmur heard. Pulmonary:     Effort: Pulmonary effort is normal. No respiratory distress.     Breath sounds: Normal breath sounds. No wheezing.  Skin:    General: Skin is warm.     Capillary Refill: Capillary refill takes less than 2 seconds.  Neurological:     General: No focal deficit present.     Mental  Status: She is alert and oriented to person, place, and time.     Cranial Nerves: No cranial nerve deficit.     Motor: No weakness.         Assessment And Plan:     1. Insulin resistance Comments: Diet controlled, contine focusing on healthy diet low in sugar and starches. Exercise when you are cleared from your recent surgery - Hemoglobin A1c - Insulin, random  2. Vitamin D deficiency - VITAMIN D 25 Hydroxy (Vit-D Deficiency, Fractures)  3. Hyperthyroidism Comments: She is followed by Endocrinology at [facility].   4. Elevated cholesterol Comments: Diet controlled, continue following a low fat diet.  5. Class 2 severe obesity due to excess calories with serious comorbidity and body mass index (BMI) of 37.0 to 37.9 in adult [facility])  She is encouraged to strive for BMI less than 30 to decrease cardiac risk. Advised to aim for at least 150 minutes of exercise per week  6. S/P myomectomy Continue your f/u with your GYN   Patient was given opportunity to ask questions. Patient verbalized understanding of the plan and was able to repeat key elements of the plan. All questions were answered to their satisfaction.  [name] Brine, FNP   I, [name] Brine, FNP, have reviewed all documentation for this visit. The documentation on 01/05/22 for the exam, diagnosis, procedures, and orders are all accurate and complete.   IF YOU HAVE BEEN REFERRED TO A SPECIALIST, IT MAY TAKE 1-2 WEEKS TO SCHEDULE/PROCESS THE REFERRAL. IF YOU HAVE NOT HEARD FROM US/SPECIALIST IN TWO WEEKS, PLEASE GIVE [name] A CALL AT [phone].   THE PATIENT IS ENCOURAGED TO PRACTICE SOCIAL DISTANCING DUE TO THE COVID-19 PANDEMIC.

## 2022-01-06 LAB — HEMOGLOBIN A1C
Est. average glucose Bld gHb Est-mCnc: 108 mg/dL
Hgb A1c MFr Bld: 5.4 % (ref 4.8–5.6)

## 2022-01-06 LAB — VITAMIN D 25 HYDROXY (VIT D DEFICIENCY, FRACTURES): Vit D, 25-Hydroxy: 29.1 ng/mL — ABNORMAL LOW (ref 30.0–100.0)

## 2022-01-06 LAB — INSULIN, RANDOM: INSULIN: 21.4 u[IU]/mL (ref 2.6–24.9)

## 2022-01-18 MED ORDER — VITAMIN D (ERGOCALCIFEROL) 1.25 MG (50000 UNIT) PO CAPS: 50000.0000 [IU] | ORAL_CAPSULE | ORAL | 1 refills | Status: DC

## 2022-01-20 DIAGNOSIS — E049 Nontoxic goiter, unspecified: Secondary | ICD-10-CM | POA: Diagnosis not present

## 2022-01-20 DIAGNOSIS — E0590 Thyrotoxicosis, unspecified without thyrotoxic crisis or storm: Secondary | ICD-10-CM | POA: Diagnosis not present

## 2022-01-20 DIAGNOSIS — Z8349 Family history of other endocrine, nutritional and metabolic diseases: Secondary | ICD-10-CM | POA: Diagnosis not present

## 2022-01-22 DIAGNOSIS — Z09 Encounter for follow-up examination after completed treatment for conditions other than malignant neoplasm: Secondary | ICD-10-CM | POA: Diagnosis not present

## 2022-02-18 ENCOUNTER — Ambulatory Visit: Payer: BC Managed Care – PPO | Admitting: Nurse Practitioner

## 2022-02-23 ENCOUNTER — Ambulatory Visit: Payer: BC Managed Care – PPO | Admitting: Nurse Practitioner

## 2022-02-23 DIAGNOSIS — E0590 Thyrotoxicosis, unspecified without thyrotoxic crisis or storm: Secondary | ICD-10-CM | POA: Diagnosis not present

## 2022-03-19 DIAGNOSIS — Z1159 Encounter for screening for other viral diseases: Secondary | ICD-10-CM | POA: Diagnosis not present

## 2022-03-19 DIAGNOSIS — Z01419 Encounter for gynecological examination (general) (routine) without abnormal findings: Secondary | ICD-10-CM | POA: Diagnosis not present

## 2022-03-19 DIAGNOSIS — Z113 Encounter for screening for infections with a predominantly sexual mode of transmission: Secondary | ICD-10-CM | POA: Diagnosis not present

## 2022-03-19 DIAGNOSIS — Z9889 Other specified postprocedural states: Secondary | ICD-10-CM | POA: Diagnosis not present

## 2022-04-23 DIAGNOSIS — E0500 Thyrotoxicosis with diffuse goiter without thyrotoxic crisis or storm: Secondary | ICD-10-CM | POA: Diagnosis not present

## 2022-04-23 DIAGNOSIS — E049 Nontoxic goiter, unspecified: Secondary | ICD-10-CM | POA: Diagnosis not present

## 2022-04-23 DIAGNOSIS — Z8639 Personal history of other endocrine, nutritional and metabolic disease: Secondary | ICD-10-CM | POA: Diagnosis not present

## 2022-07-14 ENCOUNTER — Encounter: Payer: BC Managed Care – PPO | Admitting: Nurse Practitioner

## 2022-07-14 NOTE — Progress Notes (Signed)
Not seen

## 2022-07-14 NOTE — Patient Instructions (Signed)

## 2022-07-29 DIAGNOSIS — E0500 Thyrotoxicosis with diffuse goiter without thyrotoxic crisis or storm: Secondary | ICD-10-CM | POA: Diagnosis not present

## 2022-07-29 DIAGNOSIS — E049 Nontoxic goiter, unspecified: Secondary | ICD-10-CM | POA: Diagnosis not present

## 2022-09-01 ENCOUNTER — Encounter: Payer: Self-pay | Admitting: Nurse Practitioner

## 2022-09-24 ENCOUNTER — Ambulatory Visit: Payer: BC Managed Care – PPO | Admitting: Nurse Practitioner

## 2022-09-24 ENCOUNTER — Encounter: Payer: Self-pay | Admitting: Nurse Practitioner

## 2022-09-24 VITALS — BP 136/64 | HR 75 | Temp 98.8°F | Ht 65.0 in | Wt 240.6 lb

## 2022-09-24 DIAGNOSIS — R7303 Prediabetes: Secondary | ICD-10-CM

## 2022-09-24 DIAGNOSIS — Z79899 Other long term (current) drug therapy: Secondary | ICD-10-CM | POA: Diagnosis not present

## 2022-09-24 DIAGNOSIS — Z0000 Encounter for general adult medical examination without abnormal findings: Secondary | ICD-10-CM

## 2022-09-24 DIAGNOSIS — E559 Vitamin D deficiency, unspecified: Secondary | ICD-10-CM

## 2022-09-24 DIAGNOSIS — Z6841 Body Mass Index (BMI) 40.0 and over, adult: Secondary | ICD-10-CM

## 2022-09-24 DIAGNOSIS — E0590 Thyrotoxicosis, unspecified without thyrotoxic crisis or storm: Secondary | ICD-10-CM

## 2022-09-24 DIAGNOSIS — N926 Irregular menstruation, unspecified: Secondary | ICD-10-CM | POA: Diagnosis not present

## 2022-09-24 DIAGNOSIS — Z2821 Immunization not carried out because of patient refusal: Secondary | ICD-10-CM

## 2022-09-24 DIAGNOSIS — K130 Diseases of lips: Secondary | ICD-10-CM

## 2022-09-24 DIAGNOSIS — E7800 Pure hypercholesterolemia, unspecified: Secondary | ICD-10-CM | POA: Diagnosis not present

## 2022-09-24 DIAGNOSIS — Z0001 Encounter for general adult medical examination with abnormal findings: Secondary | ICD-10-CM

## 2022-09-24 NOTE — Progress Notes (Signed)
Madelaine Bhat, CMA,acting as a Neurosurgeon for Arnette Felts, FNP.,have documented all relevant documentation on the behalf of Arnette Felts, FNP,as directed by  Arnette Felts, FNP while in the presence of Arnette Felts, FNP.  Subjective:    Patient ID: [name] [name] , female    DOB: [birth year] , 37 y.o.   MRN: [id number]  Chief Complaint  Patient presents with   Annual Exam    HPI  Patient presents today for HM, patient reports compliance with medications. Patient reports her lips have been really dry, patient reports about 3 weeks ago her lips had purulent filled bumps, her lips burned. Patient reports trying over the counter medications for her lips but nothing helped currently she only has dry lips and darken of her lips.  She is no longer on methimazole with her new Endo (Dr. Evelene Croon) and diagnosed with Graves Disease.      Past Medical History:  Diagnosis Date   Allergic rhinitis    Anginal pain (HCC)    hx of (pt had cardiac work-up and everything was normal)   Bronchitis    Dyspnea    3 times a week   Headache    History of hiatal hernia    Hyperthyroidism    Neuromuscular disorder (HCC)    carpel tunnel left hand   Pre-diabetes    no meds   SVD (spontaneous vaginal delivery) 2004   x 1     Family History  Problem Relation Age of Onset   Heart disease Father    Thyroid disease Neg Hx      Current Outpatient Medications:    albuterol (VENTOLIN HFA) 108 (90 Base) MCG/ACT inhaler, Inhale 2 puffs into the lungs every 4 (four) hours as needed for wheezing or shortness of breath., Disp: 1 each, Rfl: 3   aspirin-acetaminophen-caffeine (EXCEDRIN MIGRAINE) 250-250-65 MG tablet, Take 1-2 tablets by mouth every 6 (six) hours as needed for headache., Disp: , Rfl:    cyanocobalamin (VITAMIN B12) 1000 MCG tablet, Take 1,000 mcg by mouth daily., Disp: , Rfl:    fluticasone (FLONASE) 50 MCG/ACT nasal spray, Place 2 sprays into both nostrils daily. (Patient taking differently: Place  2 sprays into both nostrils daily as needed for allergies.), Disp: 1 g, Rfl: 0   levocetirizine (XYZAL) 5 MG tablet, Take 5 mg by mouth daily as needed for allergies., Disp: , Rfl:    methimazole (TAPAZOLE) 5 MG tablet, TAKE 1 TABLET BY MOUTH 3 TIMES A WEEK, Disp: 40 tablet, Rfl: 3   Vitamin D, Ergocalciferol, (DRISDOL) 1.25 MG (50000 UNIT) CAPS capsule, Take 1 capsule (50,000 Units total) by mouth 2 (two) times a week., Disp: 24 capsule, Rfl: 1   No Known Allergies    The patient states she uses none for birth control.  Negative for Dysmenorrhea and Negative for Menorrhagia. Negative for: breast discharge, breast lump(s), breast pain and breast self exam. Associated symptoms include abnormal vaginal bleeding. Pertinent negatives include abnormal bleeding (hematology), anxiety, decreased libido, depression, difficulty falling sleep, dyspareunia, history of infertility, nocturia, sexual dysfunction, sleep disturbances, urinary incontinence, urinary urgency, vaginal discharge and vaginal itching. Diet regular. The patient states her exercise level is minimal with some walking admits to probably not getting in as much as she should  The patient's tobacco use is:  Social History   Tobacco Use  Smoking Status Former   Current packs/day: 0.00   Average packs/day: 0.3 packs/day for 17.0 years (4.3 ttl pk-yrs)   Types: Cigars, Cigarettes  Start date: 10/31/2000   Quit date: 10/31/2017   Years since quitting: 4.9  Smokeless Tobacco Never  Tobacco Comments   Not smoked since 10/2017   She has been exposed to passive smoke. The patient's alcohol use is:  Social History   Substance and Sexual Activity  Alcohol Use Yes   Comment: Once or twice a month   Additional information: Last pap last year per patient, next one scheduled for next year.     Review of Systems  Constitutional: Negative.   HENT: Negative.    Eyes: Negative.   Respiratory: Negative.    Cardiovascular: Negative.    Gastrointestinal: Negative.   Endocrine: Negative.   Genitourinary: Negative.   Musculoskeletal: Negative.   Skin: Negative.   Allergic/Immunologic: Negative.   Neurological: Negative.   Hematological: Negative.   Psychiatric/Behavioral: Negative.       Today's Vitals   09/24/22 1602  BP: 136/64  Pulse: 75  Temp: 98.8 F (37.1 C)  TempSrc: Oral  Weight: 240 lb 9.6 oz (109.1 kg)  Height: 5\' 5"  (1.651 m)  PainSc: 0-No pain   Body mass index is 40.04 kg/m.  Wt Readings from Last 3 Encounters:  09/24/22 240 lb 9.6 oz (109.1 kg)  01/05/22 226 lb (102.5 kg)  12/12/21 235 lb (106.6 kg)     Objective:  Physical Exam Vitals reviewed.  Constitutional:      General: She is not in acute distress.    Appearance: Normal appearance. She is well-developed. She is obese.  HENT:     Head: Normocephalic and atraumatic.     Right Ear: Hearing, tympanic membrane, ear canal and external ear normal. There is no impacted cerumen.     Left Ear: Hearing, tympanic membrane, ear canal and external ear normal. There is no impacted cerumen.     Nose: Nose normal.     Mouth/Throat:     Mouth: Mucous membranes are moist.     Comments: No notable rash to lips Eyes:     General: Lids are normal.     Extraocular Movements: Extraocular movements intact.     Conjunctiva/sclera: Conjunctivae normal.     Pupils: Pupils are equal, round, and reactive to light.     Funduscopic exam:    Right eye: No papilledema.        Left eye: No papilledema.  Neck:     Thyroid: No thyroid mass.     Vascular: No carotid bruit.  Cardiovascular:     Rate and Rhythm: Normal rate and regular rhythm.     Pulses: Normal pulses.     Heart sounds: Normal heart sounds. No murmur heard. Pulmonary:     Effort: Pulmonary effort is normal.     Breath sounds: Normal breath sounds.  Chest:     Chest wall: No mass.  Breasts:    Tanner Score is 5.     Right: Normal. No mass or tenderness.     Left: Normal. No mass or  tenderness.  Abdominal:     General: Abdomen is flat. Bowel sounds are normal. There is no distension.     Palpations: Abdomen is soft.     Tenderness: There is no abdominal tenderness.  Genitourinary:    Comments: Deferred - seen by GYN  Musculoskeletal:        General: No swelling. Normal range of motion.     Cervical back: Full passive range of motion without pain, normal range of motion and neck supple.     Right  lower leg: No edema.     Left lower leg: No edema.  Lymphadenopathy:     Upper Body:     Right upper body: No supraclavicular, axillary or pectoral adenopathy.     Left upper body: No supraclavicular, axillary or pectoral adenopathy.  Skin:    General: Skin is warm and dry.     Capillary Refill: Capillary refill takes less than 2 seconds.  Neurological:     General: No focal deficit present.     Mental Status: She is alert and oriented to person, place, and time.     Cranial Nerves: No cranial nerve deficit.     Sensory: No sensory deficit.  Psychiatric:        Mood and Affect: Mood normal.        Behavior: Behavior normal.        Thought Content: Thought content normal.        Judgment: Judgment normal.         Assessment And Plan:     Encounter for annual health examination Assessment & Plan: Behavior modifications discussed and diet history reviewed.   Pt will continue to exercise regularly and modify diet with low GI, plant based foods and decrease intake of processed foods.  Recommend intake of daily multivitamin, Vitamin D, and calcium.  Recommend monthly self breast exams for preventive screenings, as well as recommend immunizations that include influenza, TDAP    Vitamin D deficiency Assessment & Plan: Will check vitamin D level and supplement as needed.    Also encouraged to spend 15 minutes in the sun daily.    Orders: -     VITAMIN D 25 Hydroxy (Vit-D Deficiency, Fractures)  Hyperthyroidism Assessment & Plan: Continue f/u with  Endocrinology  Orders: -     TSH + free T4  Elevated cholesterol Assessment & Plan: Cholesterol levels have been normal at last visit, will recheck today  Orders: -     Lipid panel  Prediabetes Assessment & Plan: HgbA1c was normal at last visit, continue focusing on diet low in sugar and starches.   Orders: -     CMP14+EGFR -     Hemoglobin A1c  Missed period Assessment & Plan: Urine pregnancy is negative.   Orders: -     POCT urine pregnancy  Rash on lips Assessment & Plan: No obvious rash during visit however pictures show small fine papules to upper lip. Discussed possible causes to include HSV.    COVID-19 vaccination declined Assessment & Plan: Declines covid 19 vaccine. Discussed risk of covid 46 and if she changes her mind about the vaccine to call the office. Education has been provided regarding the importance of this vaccine but patient still declined. Advised may receive this vaccine at local pharmacy or Health Dept.or vaccine clinic. Aware to provide a copy of the vaccination record if obtained from local pharmacy or Health Dept.  Encouraged to take multivitamin, vitamin d, vitamin c and zinc to increase immune system. Aware can call office if would like to have vaccine here at office. Verbalized acceptance and understanding.    Class 3 severe obesity due to excess calories with serious comorbidity and body mass index (BMI) of 40.0 to 44.9 in adult [facility]) Assessment & Plan: She is encouraged to strive for BMI less than 30 to decrease cardiac risk. Advised to aim for at least 150 minutes of exercise per week.    Other long term (current) drug therapy -     CBC with Differential/Platelet  Other orders -     HSV 1 and 2 Ab, IgG -     Specimen status report     Return for 1 year physical, 6 month thyroid check. Patient was given opportunity to ask questions. Patient verbalized understanding of the plan and was able to repeat key elements of the plan. All  questions were answered to their satisfaction.   Arnette Felts, FNP  I, Arnette Felts, FNP, have reviewed all documentation for this visit. The documentation on 09/24/22 for the exam, diagnosis, procedures, and orders are all accurate and complete.

## 2022-09-25 LAB — CMP14+EGFR
ALT: 21 IU/L (ref 0–32)
AST: 22 IU/L (ref 0–40)
Albumin: 4.5 g/dL (ref 3.9–4.9)
Alkaline Phosphatase: 98 IU/L (ref 44–121)
BUN/Creatinine Ratio: 9 (ref 9–23)
BUN: 7 mg/dL (ref 6–20)
Bilirubin Total: 0.3 mg/dL (ref 0.0–1.2)
CO2: 22 mmol/L (ref 20–29)
Calcium: 9.6 mg/dL (ref 8.7–10.2)
Chloride: 103 mmol/L (ref 96–106)
Creatinine, Ser: 0.74 mg/dL (ref 0.57–1.00)
Globulin, Total: 3.1 g/dL (ref 1.5–4.5)
Glucose: 86 mg/dL (ref 70–99)
Potassium: 4.6 mmol/L (ref 3.5–5.2)
Sodium: 142 mmol/L (ref 134–144)
Total Protein: 7.6 g/dL (ref 6.0–8.5)
eGFR: 106 mL/min/{1.73_m2} (ref >59)

## 2022-09-25 LAB — HEMOGLOBIN A1C
Est. average glucose Bld gHb Est-mCnc: 128 mg/dL
Hgb A1c MFr Bld: 6.1 % — ABNORMAL HIGH (ref 4.8–5.6)

## 2022-09-25 LAB — CBC WITH DIFFERENTIAL/PLATELET
Basophils Absolute: 0.1 10*3/uL (ref 0.0–0.2)
Basos: 1 %
EOS (ABSOLUTE): 0.4 10*3/uL (ref 0.0–0.4)
Eos: 5 %
Hematocrit: 41.7 % (ref 34.0–46.6)
Hemoglobin: 13.6 g/dL (ref 11.1–15.9)
Immature Grans (Abs): 0 10*3/uL (ref 0.0–0.1)
Immature Granulocytes: 0 %
Lymphocytes Absolute: 3.5 10*3/uL — ABNORMAL HIGH (ref 0.7–3.1)
Lymphs: 51 %
MCH: 27.3 pg (ref 26.6–33.0)
MCHC: 32.6 g/dL (ref 31.5–35.7)
MCV: 84 fL (ref 79–97)
Monocytes Absolute: 0.4 10*3/uL (ref 0.1–0.9)
Monocytes: 6 %
Neutrophils Absolute: 2.5 10*3/uL (ref 1.4–7.0)
Neutrophils: 37 %
Platelets: 414 10*3/uL (ref 150–450)
RBC: 4.98 x10E6/uL (ref 3.77–5.28)
RDW: 13.5 % (ref 11.7–15.4)
WBC: 6.8 10*3/uL (ref 3.4–10.8)

## 2022-09-25 LAB — LIPID PANEL
Chol/HDL Ratio: 4 ratio (ref 0.0–4.4)
Cholesterol, Total: 172 mg/dL (ref 100–199)
HDL: 43 mg/dL (ref >39)
LDL Chol Calc (NIH): 111 mg/dL — ABNORMAL HIGH (ref 0–99)
Triglycerides: 99 mg/dL (ref 0–149)
VLDL Cholesterol Cal: 18 mg/dL (ref 5–40)

## 2022-09-25 LAB — TSH+FREE T4
Free T4: 1.27 ng/dL (ref 0.82–1.77)
TSH: 1.12 u[IU]/mL (ref 0.450–4.500)

## 2022-09-25 LAB — VITAMIN D 25 HYDROXY (VIT D DEFICIENCY, FRACTURES): Vit D, 25-Hydroxy: 44.7 ng/mL (ref 30.0–100.0)

## 2022-09-30 ENCOUNTER — Other Ambulatory Visit: Payer: Self-pay | Admitting: Nurse Practitioner

## 2022-09-30 MED ORDER — VALACYCLOVIR HCL 1 G PO TABS: 1000.0000 mg | ORAL_TABLET | Freq: Two times a day (BID) | ORAL | 0 refills | Status: AC

## 2022-10-06 LAB — SPECIMEN STATUS REPORT

## 2022-10-06 LAB — HSV 1 AND 2 AB, IGG
HSV 1 Glycoprotein G Ab, IgG: <0.91 index (ref 0.00–0.90)
HSV 2 IgG, Type Spec: <0.91 index (ref 0.00–0.90)

## 2022-10-07 ENCOUNTER — Other Ambulatory Visit: Payer: Self-pay | Admitting: Nurse Practitioner

## 2022-10-07 DIAGNOSIS — L819 Disorder of pigmentation, unspecified: Secondary | ICD-10-CM

## 2022-10-12 DIAGNOSIS — N926 Irregular menstruation, unspecified: Secondary | ICD-10-CM | POA: Insufficient documentation

## 2022-10-12 DIAGNOSIS — K130 Diseases of lips: Secondary | ICD-10-CM | POA: Insufficient documentation

## 2022-10-12 DIAGNOSIS — R7303 Prediabetes: Secondary | ICD-10-CM | POA: Insufficient documentation

## 2022-10-12 DIAGNOSIS — E7800 Pure hypercholesterolemia, unspecified: Secondary | ICD-10-CM | POA: Insufficient documentation

## 2022-10-12 DIAGNOSIS — E559 Vitamin D deficiency, unspecified: Secondary | ICD-10-CM | POA: Insufficient documentation

## 2022-10-12 DIAGNOSIS — Z0000 Encounter for general adult medical examination without abnormal findings: Secondary | ICD-10-CM | POA: Insufficient documentation

## 2022-10-12 DIAGNOSIS — Z2821 Immunization not carried out because of patient refusal: Secondary | ICD-10-CM | POA: Insufficient documentation

## 2022-10-12 NOTE — Assessment & Plan Note (Signed)
Cholesterol levels have been normal at last visit, will recheck today

## 2022-10-12 NOTE — Assessment & Plan Note (Signed)
HgbA1c was normal at last visit, continue focusing on diet low in sugar and starches.

## 2022-10-12 NOTE — Assessment & Plan Note (Signed)
Continue f/u with Endocrinology.

## 2022-10-12 NOTE — Assessment & Plan Note (Signed)
She is encouraged to strive for BMI less than 30 to decrease cardiac risk. Advised to aim for at least 150 minutes of exercise per week.  

## 2022-10-12 NOTE — Assessment & Plan Note (Signed)

## 2022-10-12 NOTE — Assessment & Plan Note (Signed)
Behavior modifications discussed and diet history reviewed.   Pt will continue to exercise regularly and modify diet with low GI, plant based foods and decrease intake of processed foods.  Recommend intake of daily multivitamin, Vitamin D, and calcium.  Recommend monthly self breast exams for preventive screenings, as well as recommend immunizations that include influenza, TDAP

## 2022-10-12 NOTE — Assessment & Plan Note (Signed)
Will check vitamin D level and supplement as needed.    Also encouraged to spend 15 minutes in the sun daily.   

## 2022-10-12 NOTE — Assessment & Plan Note (Signed)
Urine pregnancy is negative.

## 2022-10-12 NOTE — Assessment & Plan Note (Signed)
No obvious rash during visit however pictures show small fine papules to upper lip. Discussed possible causes to include HSV.

## 2022-10-21 DIAGNOSIS — E049 Nontoxic goiter, unspecified: Secondary | ICD-10-CM | POA: Diagnosis not present

## 2022-10-21 DIAGNOSIS — E0500 Thyrotoxicosis with diffuse goiter without thyrotoxic crisis or storm: Secondary | ICD-10-CM | POA: Diagnosis not present

## 2022-10-21 DIAGNOSIS — K130 Diseases of lips: Secondary | ICD-10-CM | POA: Diagnosis not present

## 2022-10-21 DIAGNOSIS — Z8639 Personal history of other endocrine, nutritional and metabolic disease: Secondary | ICD-10-CM | POA: Diagnosis not present

## 2022-10-22 ENCOUNTER — Ambulatory Visit: Payer: BC Managed Care – PPO | Admitting: Family Medicine

## 2022-11-26 ENCOUNTER — Ambulatory Visit: Payer: BC Managed Care – PPO | Admitting: Family Medicine

## 2022-12-16 ENCOUNTER — Encounter: Payer: Self-pay | Admitting: Nurse Practitioner

## 2022-12-18 ENCOUNTER — Ambulatory Visit: Payer: BC Managed Care – PPO | Admitting: Family Medicine

## 2022-12-18 ENCOUNTER — Encounter: Payer: Self-pay | Admitting: Family Medicine

## 2022-12-18 VITALS — BP 116/84 | HR 82 | Temp 97.6°F | Ht 65.0 in | Wt 231.0 lb

## 2022-12-18 DIAGNOSIS — R7303 Prediabetes: Secondary | ICD-10-CM | POA: Diagnosis not present

## 2022-12-18 DIAGNOSIS — E559 Vitamin D deficiency, unspecified: Secondary | ICD-10-CM

## 2022-12-18 DIAGNOSIS — K130 Diseases of lips: Secondary | ICD-10-CM

## 2022-12-18 NOTE — Progress Notes (Signed)
New Patient Office Visit  Subjective    Patient ID: [name] [name], female    DOB: February 21, 1986  Age: 37 y.o. MRN: [id number]  CC:  Chief Complaint  Patient presents with   Establish Care    Lips turn dark and get really chapped, feels like rash under bottom lip. Has been going on for months. Went to last PCP about it and took HSV tests which were negative, may have been having allergic reaction and so stopped chapstick. May need referral to dermatologist.     HPI IVAH GIRARDOT presents to establish care  OB/GYN- Dr. Cherly [name]   C/o dry patches on her lips for the past 2 years. Darkened lips intermittently.   Using Aquaphor  She does use lip stick.   Endocrinologist- Dr. Sharl [name] at [facility]   Vitamin D deficiency      Outpatient Encounter Medications as of 12/18/2022  Medication Sig   albuterol (VENTOLIN HFA) 108 (90 Base) MCG/ACT inhaler Inhale 2 puffs into the lungs every 4 (four) hours as needed for wheezing or shortness of breath.   aspirin-acetaminophen-caffeine (EXCEDRIN MIGRAINE) 250-250-65 MG tablet Take 1-2 tablets by mouth every 6 (six) hours as needed for headache.   cyanocobalamin (VITAMIN B12) 1000 MCG tablet Take 1,000 mcg by mouth daily.   fluticasone (FLONASE) 50 MCG/ACT nasal spray Place 2 sprays into both nostrils daily. (Patient taking differently: Place 2 sprays into both nostrils daily as needed for allergies.)   levocetirizine (XYZAL) 5 MG tablet Take 5 mg by mouth daily as needed for allergies.   Vitamin D, Ergocalciferol, (DRISDOL) 1.25 MG (50000 UNIT) CAPS capsule Take 1 capsule (50,000 Units total) by mouth 2 (two) times a week.   [DISCONTINUED] methimazole (TAPAZOLE) 5 MG tablet TAKE 1 TABLET BY MOUTH 3 TIMES A WEEK   No facility-administered encounter medications on file as of 12/18/2022.    Past Medical History:  Diagnosis Date   Allergic rhinitis    Anginal pain (HCC)    hx of (pt had cardiac work-up and everything was normal)   Bronchitis     Dyspnea    3 times a week   Headache    History of hiatal hernia    Hyperthyroidism    Neuromuscular disorder (HCC)    carpel tunnel left hand   Pre-diabetes    no meds   SVD (spontaneous vaginal delivery) 2004   x 1    Past Surgical History:  Procedure Laterality Date   DILATION AND CURETTAGE OF UTERUS     termination x 1   IUD REMOVAL N/A 12/12/2021   Procedure: INTRAUTERINE DEVICE (IUD) REMOVAL;  Surgeon: [name] Better, MD;  [facility];  Service: Gynecology;  Laterality: N/A;   LAPAROTOMY N/A 12/12/2021   Procedure: EXPLORATORY LAPAROTOMY;  Surgeon: [name] Better, MD;  Location: [facility];  Service: Gynecology;  Laterality: N/A;   MYOMECTOMY N/A 12/01/2017   Procedure: MYOMECTOMY;  Surgeon: [name] Leitz, MD;  Location: [facility];  Service: Gynecology;  Laterality: N/A;   MYOMECTOMY N/A 12/12/2021   Procedure: ABDOMINAL MYOMECTOMY;  Surgeon: [name] Better, MD;  Location[facility];  Service: Gynecology;  Laterality: N/A;   SALPINGOOPHORECTOMY Left 12/12/2021   Procedure: OPEN LEFT SALPINGO OOPHORECTOMY;  Surgeon: [name] Better, MD;  Location[facility];  Service: Gynecology;  Laterality: Left;   WISDOM TOOTH EXTRACTION      Family History  Problem Relation Age of Onset   Heart disease Father    Thyroid disease Neg Hx  Social History   Socioeconomic History   Marital status: Married    Spouse name: Thereasa Distance   Number of children: 1   Years of education: Not on file   Highest education level: Master's degree (e.g., MA, MS, MEng, MEd, MSW, MBA)  Occupational History   Not on file  Tobacco Use   Smoking status: Former    Current packs/day: 0.00    Average packs/day: 0.3 packs/day for 17.0 years (4.3 ttl pk-yrs)    Types: Cigarettes, Cigars    Start date: 10/31/2000    Quit date: 10/31/2017    Years since quitting: 5.1   Smokeless tobacco: Never   Tobacco comments:    Not smoked since 10/2017  Vaping Use   Vaping status: Never Used  Substance and  Sexual Activity   Alcohol use: Yes    Comment: Once or twice a month   Drug use: No   Sexual activity: Yes    Birth control/protection: I.U.D.    Comment: Mirena IUD  Other Topics Concern   Not on file  Social History Narrative   Right Handed    Lives in an apartment on the second floor    Social Determinants of Health   Financial Resource Strain: Low Risk  (12/16/2022)   Overall Financial Resource Strain (CARDIA)    Difficulty of Paying Living Expenses: Not very hard  Food Insecurity: No Food Insecurity (12/16/2022)   Hunger Vital Sign    Worried About Running Out of Food in the Last Year: Never true    Ran Out of Food in the Last Year: Never true  Transportation Needs: No Transportation Needs (12/16/2022)   PRAPARE - Administrator, Civil Service (Medical): No    Lack of Transportation (Non-Medical): No  Physical Activity: Unknown (12/16/2022)   Exercise Vital Sign    Days of Exercise per Week: 0 days    Minutes of Exercise per Session: Not on file  Stress: Stress Concern Present (12/16/2022)   [employer] of Occupational Health - Occupational Stress Questionnaire    Feeling of Stress : To some extent  Social Connections: Moderately Integrated (12/16/2022)   Social Connection and Isolation Panel [NHANES]    Frequency of Communication with Friends and Family: More than three times a week    Frequency of Social Gatherings with Friends and Family: Once a week    Attends Religious Services: More than 4 times per year    Active Member of [employer] or Organizations: No    Attends Engineer, structural: Not on file    Marital Status: Married  Catering manager Violence: Not on file    Review of Systems  Constitutional:  Negative for chills, fever and malaise/fatigue.  HENT:  Negative for congestion, ear pain and sore throat.   Respiratory:  Negative for shortness of breath.   Cardiovascular:  Negative for chest pain, palpitations and leg swelling.   Gastrointestinal:  Negative for abdominal pain, constipation, diarrhea, nausea and vomiting.  Genitourinary:  Negative for dysuria, frequency and urgency.  Skin:  Positive for rash.       Below lower lip  Neurological:  Negative for dizziness and focal weakness.        Objective    BP 116/84 (BP Location: Left Arm, Patient Position: Sitting, Cuff Size: Large)   Pulse 82   Temp 97.6 F (36.4 C) (Temporal)   Ht 5\' 5"  (1.651 m)   Wt 231 lb (104.8 kg)   SpO2 98%   BMI 38.44  kg/m   Physical Exam Constitutional:      General: She is not in acute distress.    Appearance: She is not ill-appearing.  HENT:     Nose: Nose normal.     Mouth/Throat:     Mouth: Mucous membranes are moist.     Pharynx: Oropharynx is clear.  Eyes:     Extraocular Movements: Extraocular movements intact.     Conjunctiva/sclera: Conjunctivae normal.  Cardiovascular:     Rate and Rhythm: Normal rate.  Pulmonary:     Effort: Pulmonary effort is normal.  Musculoskeletal:     Cervical back: Normal range of motion and neck supple.  Skin:    General: Skin is warm and dry.     Findings: Rash present.     Comments: Red raised bump below lower lip. No drainage or crusting. No vesicles.   Neurological:     General: No focal deficit present.     Mental Status: She is alert and oriented to person, place, and time.  Psychiatric:        Mood and Affect: Mood normal.        Behavior: Behavior normal.        Thought Content: Thought content normal.         Assessment & Plan:   Problem List Items Addressed This Visit       Digestive   Rash on lips - Primary     Other   Prediabetes   Vitamin D deficiency   Here to establish care.  Recurrent rash below lower lip. No vesicles or sign of strep. Advised her to avoid wearing lipstick. Only use Aquaphor or Vaseline.  She has been referred to dermatology by previous PCP. She will call to schedule.  Reviewed labs with patient. Continue vitamin D  supplement.  Follow up in 3 months   Return in about 3 months (around 03/20/2023).   Hetty Blend, NP-C

## 2022-12-18 NOTE — Patient Instructions (Signed)
You were referred to [facility] in July.  You can call to schedule your visit.  [phone]   Dermatology offices   [facility]: Phone #: [phone] Address: [address] [place], [place] [address]  [facility]: Phone: [phone]  Address: [address] [place], [place] [address]  [facility]: [phone] Address: [address], [place], [place] [address] [place], [place] [address]  [facility] Address: [address] [place], [place], [place] [address] Phone: [phone]

## 2023-01-16 ENCOUNTER — Ambulatory Visit
Admission: EM | Admit: 2023-01-16 | Discharge: 2023-01-16 | Disposition: A | Discharge status: Home / Self Care | Payer: BC Managed Care – PPO | Attending: Internal Medicine | Admitting: Internal Medicine

## 2023-01-16 ENCOUNTER — Other Ambulatory Visit: Payer: Self-pay

## 2023-01-16 DIAGNOSIS — Z1152 Encounter for screening for COVID-19: Secondary | ICD-10-CM | POA: Insufficient documentation

## 2023-01-16 DIAGNOSIS — J069 Acute upper respiratory infection, unspecified: Secondary | ICD-10-CM | POA: Insufficient documentation

## 2023-01-16 DIAGNOSIS — R062 Wheezing: Secondary | ICD-10-CM | POA: Insufficient documentation

## 2023-01-16 MED ORDER — PREDNISONE 20 MG PO TABS: 40.0000 mg | ORAL_TABLET | Freq: Every day | ORAL | 0 refills | Status: AC

## 2023-01-16 MED ORDER — BENZONATATE 100 MG PO CAPS: 100.0000 mg | ORAL_CAPSULE | Freq: Three times a day (TID) | ORAL | 0 refills | Status: DC | PRN

## 2023-01-16 MED ORDER — ALBUTEROL SULFATE HFA 108 (90 BASE) MCG/ACT IN AERS: 1.0000 | INHALATION_SPRAY | Freq: Four times a day (QID) | RESPIRATORY_TRACT | 0 refills | Status: AC | PRN

## 2023-01-16 MED ORDER — AEROCHAMBER PLUS FLO-VU SMALL MISC
1.0000 | Freq: Once | Status: AC
  Administered 2023-01-16: 1

## 2023-01-16 NOTE — Discharge Instructions (Signed)
Suspect that you have a viral illness that is causing wheezing, shortness of breath, and your other symptoms.  I have sent you an albuterol inhaler, cough medication, prednisone to help with symptoms.  COVID test is also pending.  Please follow-up if any symptoms persist or worsen.

## 2023-01-16 NOTE — ED Triage Notes (Signed)
Patient presents with sore throat, shortness of breath, coughing, running nose, had a headache, body soreness, lose of appetite and fatigue. Symptoms started with a sore throat on Tuesday, now scratchy, felt hot yesterday. Treated with Nyquil and Allergy medicine without relief.

## 2023-01-16 NOTE — ED Provider Notes (Signed)
[facility]    CSN: [id number] Arrival date & time: 01/16/23  0805      History   Chief Complaint No chief complaint on file.   HPI [name] [name] is a 37 y.o. female.   Patient presents with approximately 4 to 5-day history of sore throat, coughing, runny nose, nasal congestion, body aches, decreased appetite, fatigue.  Denies any known sick contacts or fever at home.  Reports that she has been having intermittent shortness of breath and has been using her family member's albuterol inhaler with temporary improvement in symptoms.  Denies formal diagnosis of asthma but reports that she does get asthma related symptoms when she has an acute illness.  She has been taking NyQuil with minimal improvement in symptoms.  She denies that she smokes cigarettes.     Past Medical History:  Diagnosis Date   Allergic rhinitis    Anginal pain (HCC)    hx of (pt had cardiac work-up and everything was normal)   Bronchitis    Dyspnea    3 times a week   Headache    History of hiatal hernia    Hyperthyroidism    Neuromuscular disorder (HCC)    carpel tunnel left hand   Pre-diabetes    no meds   SVD (spontaneous vaginal delivery) 2004   x 1    Patient Active Problem List   Diagnosis Date Noted   Encounter for annual health examination 10/12/2022   Vitamin D deficiency 10/12/2022   Elevated cholesterol 10/12/2022   COVID-19 vaccination declined 10/12/2022   Missed period 10/12/2022   Class 3 severe obesity due to excess calories with serious comorbidity and body mass index (BMI) of 40.0 to 44.9 in adult [facility]) 10/12/2022   Prediabetes 10/12/2022   Rash on lips 10/12/2022   Uterine fibroid 12/12/2021   Ptosis 11/13/2020   Insulin resistance 09/30/2020   Wheezing 06/23/2018   Acute bronchitis 06/23/2018   S/P myomectomy 12/01/2017   Dyspnea 03/29/2017   Allergic rhinitis    Hyperthyroidism 01/29/2017    Past Surgical History:  Procedure Laterality Date    DILATION AND CURETTAGE OF UTERUS     termination x 1   IUD REMOVAL N/A 12/12/2021   Procedure: INTRAUTERINE DEVICE (IUD) REMOVAL;  Surgeon: Maxie Better, MD;  [facility];  Service: Gynecology;  Laterality: N/A;   LAPAROTOMY N/A 12/12/2021   Procedure: EXPLORATORY LAPAROTOMY;  Surgeon: Maxie Better, MD;  Location: [facility];  Service: Gynecology;  Laterality: N/A;   MYOMECTOMY N/A 12/01/2017   Procedure: MYOMECTOMY;  Surgeon: Gerald Leitz, MD;  Location: [facility];  Service: Gynecology;  Laterality: N/A;   MYOMECTOMY N/A 12/12/2021   Procedure: ABDOMINAL MYOMECTOMY;  Surgeon: Maxie Better, MD;  [facility];  Service: Gynecology;  Laterality: N/A;   SALPINGOOPHORECTOMY Left 12/12/2021   Procedure: OPEN LEFT SALPINGO OOPHORECTOMY;  Surgeon: Maxie Better, MD;  Location: [facility];  Service: Gynecology;  Laterality: Left;   WISDOM TOOTH EXTRACTION      OB History   No obstetric history on file.      Home Medications    Prior to Admission medications   Medication Sig Start Date End Date Taking? Authorizing Provider  benzonatate (TESSALON) 100 MG capsule Take 1 capsule (100 mg total) by mouth every 8 (eight) hours as needed for cough. 01/16/23  Yes Marguarite Markov, Rolly Salter E, FNP  predniSONE (DELTASONE) 20 MG tablet Take 2 tablets (40 mg total) by mouth daily for 5 days. 01/16/23 01/21/23 Yes Ervin Knack  E, FNP  albuterol (VENTOLIN HFA) 108 (90 Base) MCG/ACT inhaler Inhale 1-2 puffs into the lungs every 6 (six) hours as needed for wheezing or shortness of breath. 01/16/23   Gustavus Bryant, FNP  aspirin-acetaminophen-caffeine (EXCEDRIN MIGRAINE) [id number] MG tablet Take 1-2 tablets by mouth every 6 (six) hours as needed for headache.    [provider]  cyanocobalamin (VITAMIN B12) 1000 MCG tablet Take 1,000 mcg by mouth daily.    [provider]  fluticasone (FLONASE) 50 MCG/ACT nasal spray Place 2 sprays into both nostrils daily. Patient taking differently: Place  2 sprays into both nostrils daily as needed for allergies. 06/20/18   Cathie Hoops, Amy V, PA-C  levocetirizine (XYZAL) 5 MG tablet Take 5 mg by mouth daily as needed for allergies.    [provider]  Vitamin D, Ergocalciferol, (DRISDOL) 1.25 MG (50000 UNIT) CAPS capsule Take 1 capsule (50,000 Units total) by mouth 2 (two) times a week. 01/19/22   Arnette Felts, FNP    Family History Family History  Problem Relation Age of Onset   Heart disease Father    Thyroid disease Neg Hx     Social History Social History   Tobacco Use   Smoking status: Former    Current packs/day: 0.00    Average packs/day: 0.3 packs/day for 17.0 years (4.3 ttl pk-yrs)    Types: Cigarettes, Cigars    Start date: 10/31/2000    Quit date: 10/31/2017    Years since quitting: 5.2   Smokeless tobacco: Never   Tobacco comments:    Not smoked since 10/2017  Vaping Use   Vaping status: Never Used  Substance Use Topics   Alcohol use: Yes    Comment: Once or twice a month   Drug use: No     Allergies   Patient has no known allergies.   Review of Systems Review of Systems Per HPI  Physical Exam Triage Vital Signs ED Triage Vitals  Encounter Vitals Group     BP 01/16/23 0836 116/80     Systolic BP Percentile --      Diastolic BP Percentile --      Pulse Rate 01/16/23 0836 94     Resp --      Temp 01/16/23 0836 98.8 F (37.1 C)     Temp Source 01/16/23 0836 Oral     SpO2 01/16/23 0836 98 %     Weight 01/16/23 0835 237 lb (107.5 kg)     Height 01/16/23 0835 5\' 5"  (1.651 m)     Head Circumference --      Peak Flow --      Pain Score 01/16/23 0835 0     Pain Loc --      Pain Education --      Exclude from Growth Chart --    No data found.  Updated Vital Signs BP 116/80 (BP Location: Left Arm)   Pulse 94   Temp 98.8 F (37.1 C) (Oral)   Ht 5\' 5"  (1.651 m)   Wt 237 lb (107.5 kg)   LMP 12/30/2022   SpO2 98%   BMI 39.44 kg/m   Visual Acuity Right Eye Distance:   Left Eye Distance:    Bilateral Distance:    Right Eye Near:   Left Eye Near:    Bilateral Near:     Physical Exam Constitutional:      General: She is not in acute distress.    Appearance: Normal appearance. She is not toxic-appearing or diaphoretic.  HENT:     Head: Normocephalic and atraumatic.     Right Ear: Tympanic membrane and ear canal normal.     Left Ear: Tympanic membrane and ear canal normal.     Nose: Congestion present.     Mouth/Throat:     Mouth: Mucous membranes are moist.     Pharynx: No posterior oropharyngeal erythema.  Eyes:     Extraocular Movements: Extraocular movements intact.     Conjunctiva/sclera: Conjunctivae normal.     Pupils: Pupils are equal, round, and reactive to light.  Cardiovascular:     Rate and Rhythm: Normal rate and regular rhythm.     Pulses: Normal pulses.     Heart sounds: Normal heart sounds.  Pulmonary:     Effort: Pulmonary effort is normal. No respiratory distress.     Breath sounds: No stridor. Wheezing present. No rhonchi or rales.     Comments: Mild inspiratory wheezes on auscultation Abdominal:     General: Abdomen is flat. Bowel sounds are normal.     Palpations: Abdomen is soft.  Musculoskeletal:        General: Normal range of motion.     Cervical back: Normal range of motion.  Skin:    General: Skin is warm and dry.  Neurological:     General: No focal deficit present.     Mental Status: She is alert and oriented to person, place, and time. Mental status is at baseline.  Psychiatric:        Mood and Affect: Mood normal.        Behavior: Behavior normal.      UC Treatments / Results  Labs (all labs ordered are listed, but only abnormal results are displayed) Labs Reviewed  SARS CORONAVIRUS 2 (TAT 6-24 HRS)    EKG   Radiology No results found.  Procedures Procedures (including critical care time)  Medications Ordered in UC Medications  AeroChamber Plus Flo-Vu Small device MISC 1 each (1 each Other Given 01/16/23 0900)     Initial Impression / Assessment and Plan / UC Course  I have reviewed the triage vital signs and the nursing notes.  Pertinent labs & imaging results that were available during my care of the patient were reviewed by me and considered in my medical decision making (see chart for details).     Patient presents with symptoms likely from a viral upper respiratory infection.  Suspect possible bronchitis due to wheezing versus asthma exacerbation.  Patient is nontoxic appearing and not in need of emergent medical intervention.  COVID test pending.  Patient is not tachypneic and oxygen is normal so do not think that chest imaging or emergent evaluation is necessary.  Recommended symptom control with medications, supportive care, fluids, rest.  Will treat with albuterol inhaler, cough medication, prednisone steroid burst.  Return if symptoms fail to improve. Patient states understanding and is agreeable.  Discharged with PCP followup.  Final Clinical Impressions(s) / UC Diagnoses   Final diagnoses:  Viral upper respiratory tract infection with cough  Wheezing     Discharge Instructions      Suspect that you have a viral illness that is causing wheezing, shortness of breath, and your other symptoms.  I have sent you an albuterol inhaler, cough medication, prednisone to help with symptoms.  COVID test is also pending.  Please follow-up if any symptoms persist or worsen.    ED Prescriptions     Medication Sig Dispense Auth. Provider   albuterol (VENTOLIN HFA) 108 (  90 Base) MCG/ACT inhaler Inhale 1-2 puffs into the lungs every 6 (six) hours as needed for wheezing or shortness of breath. 1 each [place], [place], [place]   benzonatate (TESSALON) 100 MG capsule Take 1 capsule (100 mg total) by mouth every 8 (eight) hours as needed for cough. 21 capsule [place], [place], [place]   predniSONE (DELTASONE) 20 MG tablet Take 2 tablets (40 mg total) by mouth daily for 5 days. 10 tablet Gustavus Bryant, [place]       PDMP not reviewed this encounter.   Gustavus Bryant, [place] 01/16/23 [phone]

## 2023-01-17 LAB — SARS CORONAVIRUS 2 (TAT 6-24 HRS): SARS Coronavirus 2: NEGATIVE

## 2023-02-01 DIAGNOSIS — F330 Major depressive disorder, recurrent, mild: Secondary | ICD-10-CM | POA: Diagnosis not present

## 2023-02-16 DIAGNOSIS — F330 Major depressive disorder, recurrent, mild: Secondary | ICD-10-CM | POA: Diagnosis not present

## 2023-02-22 DIAGNOSIS — F330 Major depressive disorder, recurrent, mild: Secondary | ICD-10-CM | POA: Diagnosis not present

## 2023-03-01 DIAGNOSIS — F330 Major depressive disorder, recurrent, mild: Secondary | ICD-10-CM | POA: Diagnosis not present

## 2023-03-22 DIAGNOSIS — F330 Major depressive disorder, recurrent, mild: Secondary | ICD-10-CM | POA: Diagnosis not present

## 2023-03-29 ENCOUNTER — Telehealth: Payer: Self-pay

## 2023-03-29 ENCOUNTER — Ambulatory Visit: Payer: BC Managed Care – PPO | Admitting: Nurse Practitioner

## 2023-03-29 DIAGNOSIS — F330 Major depressive disorder, recurrent, mild: Secondary | ICD-10-CM | POA: Diagnosis not present

## 2023-03-29 NOTE — Telephone Encounter (Signed)
 Patient was called to let know her appointment with [facility] today has been cancelled since she has established care with [facility] family [facility].

## 2023-03-31 ENCOUNTER — Ambulatory Visit: Payer: BC Managed Care – PPO | Admitting: Family Medicine

## 2023-04-05 DIAGNOSIS — F330 Major depressive disorder, recurrent, mild: Secondary | ICD-10-CM | POA: Diagnosis not present

## 2023-04-05 DIAGNOSIS — F411 Generalized anxiety disorder: Secondary | ICD-10-CM | POA: Diagnosis not present

## 2023-04-12 DIAGNOSIS — F411 Generalized anxiety disorder: Secondary | ICD-10-CM | POA: Diagnosis not present

## 2023-04-12 DIAGNOSIS — F330 Major depressive disorder, recurrent, mild: Secondary | ICD-10-CM | POA: Diagnosis not present

## 2023-04-16 ENCOUNTER — Ambulatory Visit: Payer: BC Managed Care – PPO | Admitting: Family Medicine

## 2023-04-26 DIAGNOSIS — F330 Major depressive disorder, recurrent, mild: Secondary | ICD-10-CM | POA: Diagnosis not present

## 2023-04-26 DIAGNOSIS — F411 Generalized anxiety disorder: Secondary | ICD-10-CM | POA: Diagnosis not present

## 2023-04-27 ENCOUNTER — Encounter: Payer: Self-pay | Admitting: Family Medicine

## 2023-04-27 ENCOUNTER — Ambulatory Visit: Payer: BC Managed Care – PPO | Admitting: Family Medicine

## 2023-04-27 VITALS — BP 108/70 | HR 65 | Temp 97.6°F | Ht 65.0 in | Wt 243.0 lb

## 2023-04-27 DIAGNOSIS — R7303 Prediabetes: Secondary | ICD-10-CM

## 2023-04-27 DIAGNOSIS — K130 Diseases of lips: Secondary | ICD-10-CM

## 2023-04-27 DIAGNOSIS — E0590 Thyrotoxicosis, unspecified without thyrotoxic crisis or storm: Secondary | ICD-10-CM

## 2023-04-27 DIAGNOSIS — E559 Vitamin D deficiency, unspecified: Secondary | ICD-10-CM

## 2023-04-27 DIAGNOSIS — E7800 Pure hypercholesterolemia, unspecified: Secondary | ICD-10-CM

## 2023-04-27 DIAGNOSIS — L819 Disorder of pigmentation, unspecified: Secondary | ICD-10-CM | POA: Diagnosis not present

## 2023-04-27 DIAGNOSIS — L299 Pruritus, unspecified: Secondary | ICD-10-CM

## 2023-04-27 DIAGNOSIS — Z6841 Body Mass Index (BMI) 40.0 and over, adult: Secondary | ICD-10-CM

## 2023-04-27 LAB — COMPREHENSIVE METABOLIC PANEL
ALT: 19 U/L (ref 0–35)
AST: 21 U/L (ref 0–37)
Albumin: 4.2 g/dL (ref 3.5–5.2)
Alkaline Phosphatase: 80 U/L (ref 39–117)
BUN: 8 mg/dL (ref 6–23)
CO2: 27 meq/L (ref 19–32)
Calcium: 9.1 mg/dL (ref 8.4–10.5)
Chloride: 104 meq/L (ref 96–112)
Creatinine, Ser: 0.73 mg/dL (ref 0.40–1.20)
GFR: 103.55 mL/min (ref >60.00)
Glucose, Bld: 106 mg/dL — ABNORMAL HIGH (ref 70–99)
Potassium: 4 meq/L (ref 3.5–5.1)
Sodium: 138 meq/L (ref 135–145)
Total Bilirubin: 0.4 mg/dL (ref 0.2–1.2)
Total Protein: 7.5 g/dL (ref 6.0–8.3)

## 2023-04-27 LAB — T4, FREE: Free T4: 0.84 ng/dL (ref 0.60–1.60)

## 2023-04-27 LAB — TSH: TSH: 1.29 u[IU]/mL (ref 0.35–5.50)

## 2023-04-27 LAB — LIPID PANEL
Cholesterol: 156 mg/dL (ref 0–200)
HDL: 46.3 mg/dL (ref >39.00)
LDL Cholesterol: 93 mg/dL (ref 0–99)
NonHDL: 110.06
Total CHOL/HDL Ratio: 3
Triglycerides: 83 mg/dL (ref 0.0–149.0)
VLDL: 16.6 mg/dL (ref 0.0–40.0)

## 2023-04-27 LAB — CBC WITH DIFFERENTIAL/PLATELET
Basophils Absolute: 0.1 10*3/uL (ref 0.0–0.1)
Basophils Relative: 0.9 % (ref 0.0–3.0)
Eosinophils Absolute: 0.4 10*3/uL (ref 0.0–0.7)
Eosinophils Relative: 5.4 % — ABNORMAL HIGH (ref 0.0–5.0)
HCT: 41.3 % (ref 36.0–46.0)
Hemoglobin: 13.5 g/dL (ref 12.0–15.0)
Lymphocytes Relative: 52.3 % — ABNORMAL HIGH (ref 12.0–46.0)
Lymphs Abs: 3.5 10*3/uL (ref 0.7–4.0)
MCHC: 32.6 g/dL (ref 30.0–36.0)
MCV: 86.4 fL (ref 78.0–100.0)
Monocytes Absolute: 0.4 10*3/uL (ref 0.1–1.0)
Monocytes Relative: 5.7 % (ref 3.0–12.0)
Neutro Abs: 2.4 10*3/uL (ref 1.4–7.7)
Neutrophils Relative %: 35.7 % — ABNORMAL LOW (ref 43.0–77.0)
Platelets: 380 10*3/uL (ref 150.0–400.0)
RBC: 4.78 Mil/uL (ref 3.87–5.11)
RDW: 14.7 % (ref 11.5–15.5)
WBC: 6.7 10*3/uL (ref 4.0–10.5)

## 2023-04-27 LAB — HEMOGLOBIN A1C: Hgb A1c MFr Bld: 6.1 % (ref 4.6–6.5)

## 2023-04-27 LAB — VITAMIN D 25 HYDROXY (VIT D DEFICIENCY, FRACTURES): VITD: 24.51 ng/mL — ABNORMAL LOW (ref 30.00–100.00)

## 2023-04-27 NOTE — Progress Notes (Signed)
Subjective:     Patient ID: [name] [name], female    DOB: 1985-11-04, 38 y.o.   MRN: [id number]  Chief Complaint  Patient presents with   Medical Management of Chronic Issues    3 month f/u    HPI   History of Present Illness         Here to follow up on chronic health conditions.   OB/GYN- Dr. Cherly Hensen  Endocrinologist- Dr. Sharl Ma at [place] for goiter    She was seeing [facility] endocrinology for hyperthyroid but has not been there in several months. Would like to have her thyroid function checked. Reports itching recently.   Hx of prediabetes, HLD, and vitamin D def.   Reports intermittent lip rash and darkened skin around her lips. She did not see dermatology.  She was using only Aquaphor but wore lipstick again on recent vacation.         Health Maintenance Due  Topic Date Due   COVID-19 Vaccine (1) Never done   Cervical Cancer Screening (HPV/Pap Cotest)  05/24/2020    Past Medical History:  Diagnosis Date   Allergic rhinitis    Anginal pain (HCC)    hx of (pt had cardiac work-up and everything was normal)   Bronchitis    Dyspnea    3 times a week   Headache    History of hiatal hernia    Hyperthyroidism    Neuromuscular disorder (HCC)    carpel tunnel left hand   Pre-diabetes    no meds   SVD (spontaneous vaginal delivery) 2004   x 1    Past Surgical History:  Procedure Laterality Date   DILATION AND CURETTAGE OF UTERUS     termination x 1   IUD REMOVAL N/A 12/12/2021   Procedure: INTRAUTERINE DEVICE (IUD) REMOVAL;  Surgeon: Maxie Better, MD;  Location: [facility];  Service: Gynecology;  Laterality: N/A;   LAPAROTOMY N/A 12/12/2021   Procedure: EXPLORATORY LAPAROTOMY;  Surgeon: Maxie Better, MD;  Location: [facility];  Service: Gynecology;  Laterality: N/A;   MYOMECTOMY N/A 12/01/2017   Procedure: MYOMECTOMY;  Surgeon: Gerald Leitz, MD;  Location: [facility];  Service: Gynecology;  Laterality: N/A;   MYOMECTOMY N/A 12/12/2021   Procedure: ABDOMINAL  MYOMECTOMY;  Surgeon: Maxie Better, MD;  [facility];  Service: Gynecology;  Laterality: N/A;   SALPINGOOPHORECTOMY Left 12/12/2021   Procedure: OPEN LEFT SALPINGO OOPHORECTOMY;  Surgeon: Maxie Better, MD;  Location: [facility];  Service: Gynecology;  Laterality: Left;   WISDOM TOOTH EXTRACTION      Family History  Problem Relation Age of Onset   Heart disease Father    Thyroid disease Neg Hx     Social History   Socioeconomic History   Marital status: Married    Spouse name: Thereasa Distance   Number of children: 1   Years of education: Not on file   Highest education level: Master's degree (e.g., MA, MS, MEng, MEd, MSW, MBA)  Occupational History   Not on file  Tobacco Use   Smoking status: Former    Current packs/day: 0.00    Average packs/day: 0.3 packs/day for 17.0 years (4.3 ttl pk-yrs)    Types: Cigarettes, Cigars    Start date: 10/31/2000    Quit date: 10/31/2017    Years since quitting: 5.4   Smokeless tobacco: Never   Tobacco comments:    Not smoked since 10/2017  Vaping Use   Vaping status: Never Used  Substance and Sexual Activity   Alcohol  use: Yes    Comment: Once or twice a month   Drug use: No   Sexual activity: Yes    Birth control/protection: I.U.D.    Comment: Mirena IUD  Other Topics Concern   Not on file  Social History Narrative   Right Handed    Lives in an apartment on the second floor    Social Drivers of Health   Financial Resource Strain: Low Risk  (12/16/2022)   Overall Financial Resource Strain (CARDIA)    Difficulty of Paying Living Expenses: Not very hard  Food Insecurity: No Food Insecurity (12/16/2022)   Hunger Vital Sign    Worried About Running Out of Food in the Last Year: Never true    Ran Out of Food in the Last Year: Never true  Transportation Needs: No Transportation Needs (12/16/2022)   PRAPARE - Administrator, Civil Service (Medical): No    Lack of Transportation (Non-Medical): No  Physical Activity:  Unknown (12/16/2022)   Exercise Vital Sign    Days of Exercise per Week: 0 days    Minutes of Exercise per Session: Not on file  Stress: Stress Concern Present (12/16/2022)   [employer] of Occupational Health - Occupational Stress Questionnaire    Feeling of Stress : To some extent  Social Connections: Moderately Integrated (12/16/2022)   Social Connection and Isolation Panel [NHANES]    Frequency of Communication with Friends and Family: More than three times a week    Frequency of Social Gatherings with Friends and Family: Once a week    Attends Religious Services: More than 4 times per year    Active Member of [employer] or Organizations: No    Attends Engineer, structural: Not on file    Marital Status: Married  Catering manager Violence: Not on file    Outpatient Medications Prior to Visit  Medication Sig Dispense Refill   albuterol (VENTOLIN HFA) 108 (90 Base) MCG/ACT inhaler Inhale 1-2 puffs into the lungs every 6 (six) hours as needed for wheezing or shortness of breath. 1 each 0   aspirin-acetaminophen-caffeine (EXCEDRIN MIGRAINE) 250-250-65 MG tablet Take 1-2 tablets by mouth every 6 (six) hours as needed for headache.     cyanocobalamin (VITAMIN B12) 1000 MCG tablet Take 1,000 mcg by mouth daily.     fluticasone (FLONASE) 50 MCG/ACT nasal spray Place 2 sprays into both nostrils daily. (Patient taking differently: Place 2 sprays into both nostrils daily as needed for allergies.) 1 g 0   levocetirizine (XYZAL) 5 MG tablet Take 5 mg by mouth daily as needed for allergies.     Vitamin D, Ergocalciferol, (DRISDOL) 1.25 MG (50000 UNIT) CAPS capsule Take 1 capsule (50,000 Units total) by mouth 2 (two) times a week. 24 capsule 1   benzonatate (TESSALON) 100 MG capsule Take 1 capsule (100 mg total) by mouth every 8 (eight) hours as needed for cough. (Patient not taking: Reported on 04/27/2023) 21 capsule 0   No facility-administered medications prior to visit.    No Known  Allergies  Review of Systems  Constitutional:  Negative for chills and fever.  Respiratory:  Negative for shortness of breath.   Cardiovascular:  Negative for chest pain, palpitations and leg swelling.  Gastrointestinal:  Negative for abdominal pain, constipation, diarrhea, nausea and vomiting.  Genitourinary:  Negative for dysuria, frequency and urgency.  Skin:  Positive for itching. Negative for rash.  Neurological:  Negative for dizziness, focal weakness and headaches.  Psychiatric/Behavioral:  Negative for depression. The  patient is not nervous/anxious.        Objective:    Physical Exam Constitutional:      General: She is not in acute distress.    Appearance: She is not ill-appearing.  HENT:     Mouth/Throat:     Lips: Pink. No lesions.     Mouth: Mucous membranes are moist. No angioedema.     Pharynx: Oropharynx is clear.  Eyes:     Extraocular Movements: Extraocular movements intact.     Conjunctiva/sclera: Conjunctivae normal.  Cardiovascular:     Rate and Rhythm: Normal rate.  Pulmonary:     Effort: Pulmonary effort is normal.  Musculoskeletal:        General: Normal range of motion.     Cervical back: Normal range of motion and neck supple.  Skin:    General: Skin is warm and dry.     Findings: No rash.  Neurological:     General: No focal deficit present.     Mental Status: She is alert and oriented to person, place, and time.     Motor: No weakness.     Coordination: Coordination normal.     Gait: Gait normal.  Psychiatric:        Mood and Affect: Mood normal.        Behavior: Behavior normal.        Thought Content: Thought content normal.      BP 108/70 (BP Location: Left Arm, Patient Position: Sitting)   Pulse 65   Temp 97.6 F (36.4 C) (Temporal)   Ht 5\' 5"  (1.651 m)   Wt 243 lb (110.2 kg)   SpO2 98%   BMI 40.44 kg/m  Wt Readings from Last 3 Encounters:  04/27/23 243 lb (110.2 kg)  01/16/23 237 lb (107.5 kg)  12/18/22 231 lb (104.8 kg)        Assessment & Plan:   Problem List Items Addressed This Visit     Discoloration of skin of face   Relevant Orders   Ambulatory referral to Allergy   Hyperthyroidism   Relevant Orders   TSH (Completed)   T4, free (Completed)   Prediabetes - Primary   Relevant Orders   CBC with Differential/Platelet (Completed)   Hemoglobin A1c (Completed)   Comprehensive metabolic panel (Completed)   Pure hypercholesterolemia   Relevant Orders   Lipid panel (Completed)   Rash on lips   Relevant Orders   Ambulatory referral to Allergy   Vitamin D deficiency   Relevant Orders   VITAMIN D 25 Hydroxy (Vit-D Deficiency, Fractures) (Completed)   Other Visit Diagnoses       Severe obesity (BMI >= 40) (HCC)         Pruritus          Here for labs to check thyroid function since she has not seen her endocrinologist in several months. Reportedly, her thyroid medication was stopped by endo. Follow up pending results.  Suspect allergic dermatitis for recurrent lip rash. Recommend not wearing lipstick. Referral to allergy specialist.  Check fasting labs for HLD and A1c for prediabetes and complications related to severe obesity.   I have discontinued Ronni Rumble. Qadir's benzonatate. I am also having her maintain her fluticasone, aspirin-acetaminophen-caffeine, levocetirizine, Vitamin D (Ergocalciferol), cyanocobalamin, and albuterol.  No orders of the defined types were placed in this encounter.

## 2023-04-27 NOTE — Patient Instructions (Signed)
Please go downstairs for labs.

## 2023-05-10 DIAGNOSIS — F330 Major depressive disorder, recurrent, mild: Secondary | ICD-10-CM | POA: Diagnosis not present

## 2023-05-10 DIAGNOSIS — F411 Generalized anxiety disorder: Secondary | ICD-10-CM | POA: Diagnosis not present

## 2023-05-25 NOTE — Progress Notes (Unsigned)
 New Patient Note  RE: [name] [name] MRN: [id number] DOB: [birth year] Date of Office Visit: 05/26/2023  Consult requested by: Avanell Shackleton, NP-C Primary care provider: Avanell Shackleton, NP-C  Chief Complaint: Rash (On lips that is dry no blisters but crusty bumps that peel and itch. Used carmex. HSV bloodwork negative. )  History of Present Illness: I had the pleasure of seeing [name] [name] for initial evaluation at the [facility] on 05/26/2023. She is a 38 y.o. female, who is referred here by Avanell Shackleton, NP-C for the evaluation of rash.  Discussed the use of AI scribe software for clinical note transcription with the patient, who gave verbal consent to proceed.    She has experienced dry patches on her lips intermittently for years, which have worsened over the past two years. Initially, the dry patches resolved on their own, but they have progressed to include crusty bumps on the outside of her lips. These bumps are not blisters but are described as crusty. The condition causes tenderness and burning, especially when consuming spicy foods or when exposed to the sun.  She was using Carmex lip balm, which seemed to exacerbate the condition, leading her previous doctor to suggest a possible allergy. After discontinuing Carmex, the bumps resolved, but she continues to experience dry patches that turn dark and feel raw. She currently uses Aquaphor on her lips, which helps manage the symptoms, but the flares still occur approximately once a month, lasting about a week each time. During flares, her lips itch slightly, particularly under the tiny bumps.  She has a history of being allergic to costume jewelry, which causes dark, crusty, and flaky skin around her ears. She suspects a possible correlation between wearing earrings and lip flares. She reports no known medication or food allergies, but experiences blistering with mosquito bites. She has not undergone  allergy testing but suspects year-round environmental allergies, for which she has taken fluticasone and Allegra without significant relief.  She has undergone blood work for HSV 1 and 2, which returned negative results. She has not tried any medicated topical creams for her lips other than Aquaphor.  She has a history of asthma-like symptoms, using an inhaler and nebulizer as needed. She experiences difficulty breathing with physical activity, attributing it to being out of shape. Her husband and brother have asthma. She does not smoke and has no pets at home.  No fever, chills, or systemic illness associated with her lip condition. Normal eating and bathroom habits. No hives or eczema and does not frequently require antibiotics.     04/27/2023 PCP visit: "Reports intermittent lip rash and darkened skin around her lips. She did not see dermatology.  She was using only Aquaphor but wore lipstick again on recent vacation."  Assessment and Plan: [name] [name] is a 38 y.o. 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 Symptoms persist for years, exacerbating monthly. Worsened by Carmex, now discontinued. Aquaphor reduces severity. Potential triggers: spicy/acidic foods, sun exposure, nickel contact. Negative HSV test. No clear environmental or food allergy triggers. Etiology unclear.  Use plain vaseline ointment or Aquaphor ointment as a moisturizer.  Keep track of rashes and take pictures. Write down what you had done/eaten during flares.  See below for proper skin care. Use fragrance free and dye free products. No dryer sheets or fabric softener.   Avoid spicy foods, acidic foods, irritating foods. Place a thin layer of vaseline around  mouth prior to eating and wipe down with water after eating.  Avoid sodium lauryl sulfate containing toothpaste.  Limit mouthwash.  Use desonide 0.05% ointment twice a day as needed for mild rash flares -  okay to use on the face, neck, groin area. Do not use more than 1 week at a time. Consider patch testing in future.  Other allergic rhinitis Year-round allergy symptoms unresponsive to OTC medications like fluticasone and Allegra. No prior allergy testing.  Consider skin testing in future. Use over the counter antihistamines such as Zyrtec (cetirizine), Claritin (loratadine), Allegra (fexofenadine), or Xyzal (levocetirizine) daily as needed. May take twice a day during allergy flares. May switch antihistamines every few months.  Mild intermittent reactive airway disease Occasional exertional dyspnea attributed to deconditioning. No regular inhaler use required. Uses rescue inhaler and nebulizer as needed. No asthma diagnosis Today's spirometry was normal with 3.9% and 90cc improvement in FEV1 post bronchodilator treatment. Clinically feeling slightly improved. Monitor symptoms. May use albuterol rescue inhaler 2 puffs or nebulizer every 4 to 6 hours as needed for shortness of breath, chest tightness, coughing, and wheezing. May use albuterol rescue inhaler 2 puffs 5 to 15 minutes prior to strenuous physical activities. Monitor frequency of use - if you need to use it more than twice per week on a consistent basis let us know.   Return in about 3 months (around 08/26/2023).  Meds ordered this encounter  Medications   desonide (DESOWEN) 0.05 % ointment    Sig: Apply 1 Application topically 2 (two) times daily as needed (mild rash flare). Okay to use on the face, neck, groin area. Do not use more than 1 week at a time.    Dispense:  60 g    Refill:  2   Lab Orders  No laboratory test(s) ordered today    Other allergy screening: Asthma:  Had issues with breathing with exertion and uses albuterol prn with good benefit.   Rhino conjunctivitis: yes Perennial symptoms and takes Flonase and allegra with no benefit.  No prior allergy testing.   Food allergy: no Medication allergy:  no Hymenoptera allergy: no Urticaria: no Eczema:no History of recurrent infections suggestive of immunodeficency: no  Diagnostics: Spirometry:  Tracings reviewed. Her effort: Good reproducible efforts. FVC: 2.83L FEV1: 2.31L, 77% predicted FEV1/FVC ratio: 82% Interpretation: Spirometry consistent with normal pattern with 3.9% and 90cc improvement in FEV1 post bronchodilator treatment. Clinically feeling slightly improved.   Please see scanned spirometry results for details. Results discussed with patient/family.  Past Medical History: Patient Active Problem List   Diagnosis Date Noted   Discoloration of skin of face 04/27/2023   Encounter for annual health examination 10/12/2022   Vitamin D deficiency 10/12/2022   Pure hypercholesterolemia 10/12/2022   COVID-19 vaccination declined 10/12/2022   Missed period 10/12/2022   Class 3 severe obesity due to excess calories with serious comorbidity and body mass index (BMI) of 40.0 to 44.9 in adult [facility]) 10/12/2022   Prediabetes 10/12/2022   Rash on lips 10/12/2022   Uterine fibroid 12/12/2021   Ptosis 11/13/2020   Insulin resistance 09/30/2020   Wheezing 06/23/2018   Acute bronchitis 06/23/2018   S/P myomectomy 12/01/2017   Dyspnea 03/29/2017   Allergic rhinitis    Hyperthyroidism 01/29/2017   Past Medical History:  Diagnosis Date   Allergic rhinitis    Anginal pain (HCC)    hx of (pt had cardiac work-up and everything was normal)   Bronchitis    Dyspnea    3 times  a week   Headache    History of hiatal hernia    Hyperthyroidism    Neuromuscular disorder (HCC)    carpel tunnel left hand   Pre-diabetes    no meds   SVD (spontaneous vaginal delivery) 2004   x 1   Past Surgical History: Past Surgical History:  Procedure Laterality Date   DILATION AND CURETTAGE OF UTERUS     termination x 1   IUD REMOVAL N/A 12/12/2021   Procedure: INTRAUTERINE DEVICE (IUD) REMOVAL;  Surgeon: Maxie Better, MD;  [facility];  Service: Gynecology;  Laterality: N/A;   LAPAROTOMY N/A 12/12/2021   Procedure: EXPLORATORY LAPAROTOMY;  Surgeon: Maxie Better, MD;  [facility]: [facility];  Service: Gynecology;  Laterality: N/A;   MYOMECTOMY N/A 12/01/2017   Procedure: MYOMECTOMY;  Surgeon: Gerald Leitz, MD;  Location: [facility];  Service: Gynecology;  Laterality: N/A;   MYOMECTOMY N/A 12/12/2021   Procedure: ABDOMINAL MYOMECTOMY;  Surgeon: Maxie Better, MD;  [facility];  Service: Gynecology;  Laterality: N/A;   SALPINGOOPHORECTOMY Left 12/12/2021   Procedure: OPEN LEFT SALPINGO OOPHORECTOMY;  Surgeon: Maxie Better, MD;  Location: [facility];  Service: Gynecology;  Laterality: Left;   WISDOM TOOTH EXTRACTION     Medication List:  Current Outpatient Medications  Medication Sig Dispense Refill   albuterol (VENTOLIN HFA) 108 (90 Base) MCG/ACT inhaler Inhale 1-2 puffs into the lungs every 6 (six) hours as needed for wheezing or shortness of breath. 1 each 0   APPLE CIDER VINEGAR PO Take by mouth.     Cholecalciferol (VITAMIN D-3 PO) Take by mouth.     cyanocobalamin (VITAMIN B12) 1000 MCG tablet Take 1,000 mcg by mouth daily.     desonide (DESOWEN) 0.05 % ointment Apply 1 Application topically 2 (two) times daily as needed (mild rash flare). Okay to use on the face, neck, groin area. Do not use more than 1 week at a time. 60 g 2   fluticasone (FLONASE) 50 MCG/ACT nasal spray Place 2 sprays into both nostrils daily. (Patient taking differently: Place 2 sprays into both nostrils daily as needed for allergies.) 1 g 0   levocetirizine (XYZAL) 5 MG tablet Take 5 mg by mouth daily as needed for allergies. (Patient not taking: Reported on 05/26/2023)     No current facility-administered medications for this visit.   Allergies: No Known Allergies Social History: Social History   Socioeconomic History   Marital status: Married    Spouse name: Thereasa Distance   Number of children: 1   Years of education: Not on file    Highest education level: Master's degree (e.g., MA, MS, MEng, MEd, MSW, MBA)  Occupational History   Not on file  Tobacco Use   Smoking status: Former    Current packs/day: 0.00    Average packs/day: 0.3 packs/day for 17.0 years (4.3 ttl pk-yrs)    Types: Cigarettes, Cigars    Start date: 10/31/2000    Quit date: 10/31/2017    Years since quitting: 5.5    Passive exposure: Never   Smokeless tobacco: Never   Tobacco comments:    Not smoked since 10/2017  Vaping Use   Vaping status: Never Used  Substance and Sexual Activity   Alcohol use: Yes    Comment: Once or twice a month   Drug use: No   Sexual activity: Yes    Birth control/protection: I.U.D.    Comment: Mirena IUD  Other Topics Concern   Not on file  Social History Narrative  Right Handed    Lives in an apartment on the second floor    Social Drivers of Health   Financial Resource Strain: Low Risk  (12/16/2022)   Overall Financial Resource Strain (CARDIA)    Difficulty of Paying Living Expenses: Not very hard  Food Insecurity: No Food Insecurity (12/16/2022)   Hunger Vital Sign    Worried About Running Out of Food in the Last Year: Never true    Ran Out of Food in the Last Year: Never true  Transportation Needs: No Transportation Needs (12/16/2022)   PRAPARE - Administrator, Civil Service (Medical): No    Lack of Transportation (Non-Medical): No  Physical Activity: Unknown (12/16/2022)   Exercise Vital Sign    Days of Exercise per Week: 0 days    Minutes of Exercise per Session: Not on file  Stress: Stress Concern Present (12/16/2022)   [employer] of Occupational Health - Occupational Stress Questionnaire    Feeling of Stress : To some extent  Social Connections: Moderately Integrated (12/16/2022)   Social Connection and Isolation Panel [NHANES]    Frequency of Communication with Friends and Family: More than three times a week    Frequency of Social Gatherings with Friends and Family: Once a week     Attends Religious Services: More than 4 times per year    Active Member of [employer] or Organizations: No    Attends Engineer, structural: Not on file    Marital Status: Married   Lives in an apartment. Smoking: denies Occupation: Immunologist   Environmental History: Water Damage/mildew in the house: no Carpet in the family room: no Carpet in the bedroom: yes Heating: electric Cooling: central Pet: no  Family History: Family History  Problem Relation Age of Onset   Heart disease Father    Thyroid disease Neg Hx    Problem                               Relation Asthma                                   Brother  Eczema                                no Food allergy                          no Allergic rhino conjunctivitis     no  Review of Systems  Constitutional:  Negative for appetite change, chills, fever and unexpected weight change.  HENT:  Negative for congestion and rhinorrhea.   Eyes:  Negative for itching.  Respiratory:  Negative for cough, chest tightness, shortness of breath and wheezing.   Cardiovascular:  Negative for chest pain.  Gastrointestinal:  Negative for abdominal pain.  Genitourinary:  Negative for difficulty urinating.  Skin:  Positive for rash.  Neurological:  Negative for headaches.    Objective: BP 102/74   Pulse 88   Temp 98.2 F (36.8 C) (Temporal)   Resp 16   Ht 5\' 8"  (1.727 m)   Wt 239 lb (108.4 kg)   SpO2 99%   BMI 36.34 kg/m  Body mass index is 36.34 kg/m. Physical Exam Vitals and nursing note reviewed.  Constitutional:  Appearance: Normal appearance. She is well-developed.  HENT:     Head: Normocephalic and atraumatic.     Right Ear: Tympanic membrane and external ear normal.     Left Ear: Tympanic membrane and external ear normal.     Nose: Nose normal.     Mouth/Throat:     Mouth: Mucous membranes are moist.     Pharynx: Oropharynx is clear.  Eyes:     Conjunctiva/sclera: Conjunctivae normal.   Cardiovascular:     Rate and Rhythm: Normal rate and regular rhythm.     Heart sounds: Normal heart sounds. No murmur heard.    No friction rub. No gallop.  Pulmonary:     Effort: Pulmonary effort is normal.     Breath sounds: Normal breath sounds. No wheezing, rhonchi or rales.  Musculoskeletal:     Cervical back: Neck supple.  Skin:    General: Skin is warm.     Findings: No rash.  Neurological:     Mental Status: She is alert and oriented to person, place, and time.  Psychiatric:        Behavior: Behavior normal.   The plan was reviewed with the patient/family, and all questions/concerned were addressed.  It was my pleasure to see Marcheta today and participate in her care. Please feel free to contact me with any questions or concerns.  Sincerely,  Wyline Mood, DO Allergy & Immunology  [facility] office: [phone] [facility] office: [phone]

## 2023-05-26 ENCOUNTER — Ambulatory Visit: Payer: BC Managed Care – PPO | Admitting: Allergy

## 2023-05-26 ENCOUNTER — Encounter: Payer: Self-pay | Admitting: Allergy

## 2023-05-26 VITALS — BP 102/74 | HR 88 | Temp 98.2°F | Resp 16 | Ht 68.0 in | Wt 239.0 lb

## 2023-05-26 DIAGNOSIS — J3089 Other allergic rhinitis: Secondary | ICD-10-CM | POA: Diagnosis not present

## 2023-05-26 DIAGNOSIS — J4520 Mild intermittent asthma, uncomplicated: Secondary | ICD-10-CM | POA: Diagnosis not present

## 2023-05-26 DIAGNOSIS — L309 Dermatitis, unspecified: Secondary | ICD-10-CM

## 2023-05-26 MED ORDER — DESONIDE 0.05 % EX OINT: 1.0000 | TOPICAL_OINTMENT | Freq: Two times a day (BID) | CUTANEOUS | 2 refills | Status: AC | PRN

## 2023-05-26 NOTE — Patient Instructions (Addendum)
 Lip Use plain vaseline ointment or Aquaphor ointment as a moisturizer.  Keep track of rashes and take pictures. Write down what you had done/eaten during flares.  See below for proper skin care. Use fragrance free and dye free products. No dryer sheets or fabric softener.   Avoid spicy foods, acidic foods, irritating foods. Place a thin layer of vaseline around mouth prior to eating and wipe down with water after eating.  Avoid sodium lauryl sulfate containing toothpaste.  Limit mouthwash.  Use desonide 0.05% ointment twice a day as needed for mild rash flares - okay to use on the face, neck, groin area. Do not use more than 1 week at a time.  Consider patch testing Patches are best placed on Monday with return to office on Wednesday and Friday of same week for readings.  Patches once placed should not get wet.  You do not have to stop any medications for patch testing but should not be on oral prednisone. You can schedule a patch testing visit when convenient for your schedule.    Breathing Monitor symptoms. May use albuterol rescue inhaler 2 puffs or nebulizer every 4 to 6 hours as needed for shortness of breath, chest tightness, coughing, and wheezing. May use albuterol rescue inhaler 2 puffs 5 to 15 minutes prior to strenuous physical activities. Monitor frequency of use - if you need to use it more than twice per week on a consistent basis let us know.   Environmental allergies Consider skin testing in future. Use over the counter antihistamines such as Zyrtec (cetirizine), Claritin (loratadine), Allegra (fexofenadine), or Xyzal (levocetirizine) daily as needed. May take twice a day during allergy flares. May switch antihistamines every few months.  Skin testing instructions:  Make sure you don't take any antihistamines for 3 days before the skin testing appointment. Don't put any lotion on the back and arms on the day of testing.  Plan on being here for 30-60 minutes.   Follow up  in 3 months or sooner if needed.  Skin care recommendations  Bath time: Always use lukewarm water. AVOID very hot or cold water. Keep bathing time to 5-10 minutes. Do NOT use bubble bath. Use a mild soap and use just enough to wash the dirty areas. Do NOT scrub skin vigorously.  After bathing, pat dry your skin with a towel. Do NOT rub or scrub the skin.  Moisturizers and prescriptions:  ALWAYS apply moisturizers immediately after bathing (within 3 minutes). This helps to lock-in moisture. Use the moisturizer several times a day over the whole body. Good summer moisturizers include: Aveeno, CeraVe, Cetaphil. Good winter moisturizers include: Aquaphor, Vaseline, Cerave, Cetaphil, Eucerin, Vanicream. When using moisturizers along with medications, the moisturizer should be applied about one hour after applying the medication to prevent diluting effect of the medication or moisturize around where you applied the medications. When not using medications, the moisturizer can be continued twice daily as maintenance.  Laundry and clothing: Avoid laundry products with added color or perfumes. Use unscented hypo-allergenic laundry products such as Tide free, Cheer free & gentle, and All free and clear.  If the skin still seems dry or sensitive, you can try double-rinsing the clothes. Avoid tight or scratchy clothing such as wool. Do not use fabric softeners or dyer sheets.

## 2023-06-10 DIAGNOSIS — F330 Major depressive disorder, recurrent, mild: Secondary | ICD-10-CM | POA: Diagnosis not present

## 2023-06-10 DIAGNOSIS — F411 Generalized anxiety disorder: Secondary | ICD-10-CM | POA: Diagnosis not present

## 2023-06-21 DIAGNOSIS — F330 Major depressive disorder, recurrent, mild: Secondary | ICD-10-CM | POA: Diagnosis not present

## 2023-06-21 DIAGNOSIS — F411 Generalized anxiety disorder: Secondary | ICD-10-CM | POA: Diagnosis not present

## 2023-08-29 NOTE — Progress Notes (Deleted)
 Follow Up Note  RE: [name] [name] MRN: [id number] DOB: 12/15/1985 Date of Office Visit: 08/30/2023  Referring provider: Abram Abraham, NP-C Primary care provider: Abram Abraham, NP-C  Chief Complaint: No chief complaint on file.  History of Present Illness: I had the pleasure of seeing [name] [name] for a follow up visit at the [facility] on 08/30/2023. She is a 38 y.o. female, who is being followed for left dermatitis, allergic rhinitis, reactive airway disease. Her previous allergy office visit was on 05/26/2023 with Dr. Burdette Carolin. Today is a regular follow up visit.  Discussed the use of AI scribe software for clinical note transcription with the patient, who gave verbal consent to proceed.  History of Present Illness            ***  Assessment and Plan: [name] is a 38 y.o. 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 Symptoms persist for years, exacerbating monthly. Worsened by Carmex, now discontinued. Aquaphor reduces severity. Potential triggers: spicy/acidic foods, sun exposure, nickel contact. Negative HSV test. No clear environmental or food allergy triggers. Etiology unclear.  Use plain vaseline ointment or Aquaphor ointment as a moisturizer.  Keep track of rashes and take pictures. Write down what you had done/eaten during flares.  See below for proper skin care. Use fragrance free and dye free products. No dryer sheets or fabric softener.   Avoid spicy foods, acidic foods, irritating foods. Place a thin layer of vaseline around mouth prior to eating and wipe down with water  after eating.  Avoid sodium lauryl sulfate containing toothpaste.  Limit mouthwash.  Use desonide  0.05% ointment twice a day as needed for mild rash flares - okay to use on the face, neck, groin area. Do not use more than 1 week at a time. Consider patch testing in future.   Other allergic  rhinitis Year-round allergy symptoms unresponsive to OTC medications like fluticasone  and Allegra. No prior allergy testing.  Consider skin testing in future. Use over the counter antihistamines such as Zyrtec (cetirizine), Claritin  (loratadine ), Allegra (fexofenadine), or Xyzal (levocetirizine) daily as needed. May take twice a day during allergy flares. May switch antihistamines every few months.   Mild intermittent reactive airway disease Occasional exertional dyspnea attributed to deconditioning. No regular inhaler use required. Uses rescue inhaler and nebulizer as needed. No asthma diagnosis Today's spirometry was normal with 3.9% and 90cc improvement in FEV1 post bronchodilator treatment. Clinically feeling slightly improved. Monitor symptoms. May use albuterol  rescue inhaler 2 puffs or nebulizer every 4 to 6 hours as needed for shortness of breath, chest tightness, coughing, and wheezing. May use albuterol  rescue inhaler 2 puffs 5 to 15 minutes prior to strenuous physical activities. Monitor frequency of use - if you need to use it more than twice per week on a consistent basis let us  know.  Assessment and Plan              No follow-ups on file.  No orders of the defined types were placed in this encounter.  Lab Orders  No laboratory test(s) ordered today    Diagnostics: Spirometry:  Tracings reviewed. Her effort: {Blank single:19197::Good reproducible efforts.,It was hard to get consistent efforts and there is a question as to whether this reflects a maximal maneuver.,Poor effort, data can not be interpreted.} FVC: ***L FEV1: ***L, ***% predicted FEV1/FVC ratio: ***% Interpretation: {Blank single:19197::Spirometry consistent with mild obstructive disease,Spirometry consistent with moderate obstructive disease,Spirometry consistent with  severe obstructive disease,Spirometry consistent with possible restrictive disease,Spirometry consistent with mixed  obstructive and restrictive disease,Spirometry uninterpretable due to technique,Spirometry consistent with normal pattern,No overt abnormalities noted given today's efforts}.  Please see scanned spirometry results for details.  Skin Testing: {Blank single:19197::Select foods,Environmental allergy panel,Environmental allergy panel and select foods,Food allergy panel,None,Deferred due to recent antihistamines use}. *** Results discussed with patient/family.   Medication List:  Current Outpatient Medications  Medication Sig Dispense Refill  . albuterol  (VENTOLIN  HFA) 108 (90 Base) MCG/ACT inhaler Inhale 1-2 puffs into the lungs every 6 (six) hours as needed for wheezing or shortness of breath. 1 each 0  . APPLE CIDER VINEGAR PO Take by mouth.    . Cholecalciferol (VITAMIN D -3 PO) Take by mouth.    . cyanocobalamin  (VITAMIN B12) 1000 MCG tablet Take 1,000 mcg by mouth daily.    . desonide  (DESOWEN ) 0.05 % ointment Apply 1 Application topically 2 (two) times daily as needed (mild rash flare). Okay to use on the face, neck, groin area. Do not use more than 1 week at a time. 60 g 2  . fluticasone  (FLONASE ) 50 MCG/ACT nasal spray Place 2 sprays into both nostrils daily. (Patient taking differently: Place 2 sprays into both nostrils daily as needed for allergies.) 1 g 0  . levocetirizine (XYZAL) 5 MG tablet Take 5 mg by mouth daily as needed for allergies. (Patient not taking: Reported on 05/26/2023)     No current facility-administered medications for this visit.   Allergies: No Known Allergies I reviewed her past medical history, social history, family history, and environmental history and no significant changes have been reported from her previous visit.  Review of Systems  Constitutional:  Negative for appetite change, chills, fever and unexpected weight change.  HENT:  Negative for congestion and rhinorrhea.   Eyes:  Negative for itching.  Respiratory:  Negative for cough,  chest tightness, shortness of breath and wheezing.   Cardiovascular:  Negative for chest pain.  Gastrointestinal:  Negative for abdominal pain.  Genitourinary:  Negative for difficulty urinating.  Skin:  Positive for rash.  Neurological:  Negative for headaches.   Objective: There were no vitals taken for this visit. There is no height or weight on file to calculate BMI. Physical Exam Vitals and nursing note reviewed.  Constitutional:      Appearance: Normal appearance. She is well-developed.  HENT:     Head: Normocephalic and atraumatic.     Right Ear: Tympanic membrane and external ear normal.     Left Ear: Tympanic membrane and external ear normal.     Nose: Nose normal.     Mouth/Throat:     Mouth: Mucous membranes are moist.     Pharynx: Oropharynx is clear.   Eyes:     Conjunctiva/sclera: Conjunctivae normal.    Cardiovascular:     Rate and Rhythm: Normal rate and regular rhythm.     Heart sounds: Normal heart sounds. No murmur heard.    No friction rub. No gallop.  Pulmonary:     Effort: Pulmonary effort is normal.     Breath sounds: Normal breath sounds. No wheezing, rhonchi or rales.   Musculoskeletal:     Cervical back: Neck supple.   Skin:    General: Skin is warm.     Findings: No rash.   Neurological:     Mental Status: She is alert and oriented to person, place, and time.   Psychiatric:        Behavior: Behavior normal.  Previous notes and tests were reviewed. The plan was reviewed with the patient/family, and all questions/concerned were addressed.  It was my pleasure to see Nesiah today and participate in her care. Please feel free to contact me with any questions or concerns.  Sincerely,  Eudelia Hero, DO Allergy & Immunology  [facility]  [place] office: [phone] [facility] office: [phone]

## 2023-08-30 ENCOUNTER — Ambulatory Visit: Admitting: Allergy

## 2023-08-30 DIAGNOSIS — J309 Allergic rhinitis, unspecified: Secondary | ICD-10-CM

## 2023-08-30 DIAGNOSIS — J3089 Other allergic rhinitis: Secondary | ICD-10-CM

## 2023-08-30 DIAGNOSIS — J4520 Mild intermittent asthma, uncomplicated: Secondary | ICD-10-CM

## 2023-08-30 DIAGNOSIS — L309 Dermatitis, unspecified: Secondary | ICD-10-CM

## 2023-09-28 ENCOUNTER — Encounter: Payer: BC Managed Care – PPO | Admitting: Nurse Practitioner

## 2023-11-24 ENCOUNTER — Ambulatory Visit: Payer: Self-pay | Admitting: Family Medicine

## 2024-01-03 ENCOUNTER — Encounter: Payer: Self-pay | Admitting: Emergency Medicine

## 2024-01-03 ENCOUNTER — Ambulatory Visit: Payer: Self-pay | Admitting: Emergency Medicine

## 2024-01-03 VITALS — BP 128/86 | HR 73 | Temp 98.4°F | Ht 65.0 in | Wt 230.6 lb

## 2024-01-03 DIAGNOSIS — R21 Rash and other nonspecific skin eruption: Secondary | ICD-10-CM | POA: Diagnosis not present

## 2024-01-03 LAB — HEMOGLOBIN A1C: Hgb A1c MFr Bld: 6 % (ref 4.6–6.5)

## 2024-01-03 LAB — COMPREHENSIVE METABOLIC PANEL WITH GFR
ALT: 15 U/L (ref 0–35)
AST: 16 U/L (ref 0–37)
Albumin: 4.2 g/dL (ref 3.5–5.2)
Alkaline Phosphatase: 74 U/L (ref 39–117)
BUN: 12 mg/dL (ref 6–23)
CO2: 26 meq/L (ref 19–32)
Calcium: 9 mg/dL (ref 8.4–10.5)
Chloride: 105 meq/L (ref 96–112)
Creatinine, Ser: 0.73 mg/dL (ref 0.40–1.20)
GFR: 103.05 mL/min (ref >60.00)
Glucose, Bld: 95 mg/dL (ref 70–99)
Potassium: 3.9 meq/L (ref 3.5–5.1)
Sodium: 140 meq/L (ref 135–145)
Total Bilirubin: 0.3 mg/dL (ref 0.2–1.2)
Total Protein: 7.1 g/dL (ref 6.0–8.3)

## 2024-01-03 LAB — CBC WITH DIFFERENTIAL/PLATELET
Basophils Absolute: 0.1 K/uL (ref 0.0–0.1)
Basophils Relative: 0.8 % (ref 0.0–3.0)
Eosinophils Absolute: 0.3 K/uL (ref 0.0–0.7)
Eosinophils Relative: 4.7 % (ref 0.0–5.0)
HCT: 39 % (ref 36.0–46.0)
Hemoglobin: 12.8 g/dL (ref 12.0–15.0)
Lymphocytes Relative: 51.5 % — ABNORMAL HIGH (ref 12.0–46.0)
Lymphs Abs: 3.5 K/uL (ref 0.7–4.0)
MCHC: 32.7 g/dL (ref 30.0–36.0)
MCV: 84.2 fl (ref 78.0–100.0)
Monocytes Absolute: 0.5 K/uL (ref 0.1–1.0)
Monocytes Relative: 7.3 % (ref 3.0–12.0)
Neutro Abs: 2.4 K/uL (ref 1.4–7.7)
Neutrophils Relative %: 35.7 % — ABNORMAL LOW (ref 43.0–77.0)
Platelets: 325 K/uL (ref 150.0–400.0)
RBC: 4.63 Mil/uL (ref 3.87–5.11)
RDW: 14.5 % (ref 11.5–15.5)
WBC: 6.8 K/uL (ref 4.0–10.5)

## 2024-01-03 LAB — VITAMIN D 25 HYDROXY (VIT D DEFICIENCY, FRACTURES): VITD: 27.26 ng/mL — ABNORMAL LOW (ref 30.00–100.00)

## 2024-01-03 LAB — LIPID PANEL
Cholesterol: 142 mg/dL (ref 0–200)
HDL: 35.1 mg/dL — ABNORMAL LOW (ref >39.00)
LDL Cholesterol: 83 mg/dL (ref 0–99)
NonHDL: 107.32
Total CHOL/HDL Ratio: 4
Triglycerides: 120 mg/dL (ref 0.0–149.0)
VLDL: 24 mg/dL (ref 0.0–40.0)

## 2024-01-03 LAB — VITAMIN B12: Vitamin B-12: 576 pg/mL (ref 211–911)

## 2024-01-03 LAB — TSH: TSH: 1.76 u[IU]/mL (ref 0.35–5.50)

## 2024-01-03 NOTE — Patient Instructions (Signed)
 Contact Dermatitis Dermatitis is when your skin becomes red, sore, and swollen.  Contact dermatitis happens when your body reacts to something that touches the skin. There are 2 types: Irritant contact dermatitis. This is when something bothers your skin, like soap. Allergic contact dermatitis. This is when your skin touches something you are allergic to, like poison ivy. What are the causes? Irritant contact dermatitis may be caused by: Makeup. Soaps. Detergents. Bleaches. Acids. Metals, like nickel. Allergic contact dermatitis may be caused by: Plants. Chemicals. Jewelry. Latex. Medicines. Preservatives. These are things added to products to help them last longer. There may be some in your clothes. What increases the risk? Having a job where you have to be near things that bother your skin. Having asthma or eczema. What are the signs or symptoms?  Dry or flaky skin. Redness. Cracks. Itching. Moderate symptoms of this condition include: Pain or a burning feeling. Blisters. Blood or clear fluid coming from cracks in your skin. Swelling. This may be on your eyelids, mouth, or genitals. How is this treated? Your doctor will find out what is making your skin react. Then, you can protect your skin. You may need to use: Steroid creams, ointments, or medicines. Antibiotics or other ointments, if you have a skin infection. Lotion or medicines to help with itching. A bandage. Follow these instructions at home: Skin care Put moisturizer on your skin when it needs it. Put cool, wet cloths on your skin (cool compresses). Put a baking soda paste on your skin. Stir water into baking soda until it looks like a paste. Do not scratch your skin. Try not to have things rub up against your skin. Avoid tight clothing. Avoid using soaps, perfumes, and dyes. Check your skin every day for signs of infection. Check for: More redness, swelling, or pain. More fluid or blood. Warmth. Pus or  a bad smell. Medicines Take or apply over-the-counter and prescription medicines only as told by your doctor. If you were prescribed antibiotics, take or apply them as told by your doctor. Do not stop using them even if you start to feel better. Bathing Take a bath with: Epsom salts. Baking soda. Colloidal oatmeal. Bathe less often. Bathe in warm water. Try not to use hot water. Bandage care If you were given a bandage, change it as told by your doctor. Wash your hands with soap and water for at least 20 seconds before and after you change your bandage. If you cannot use soap and water, use hand sanitizer. General instructions Avoid the things that caused your reaction. If you don't know what caused it, keep a journal. Write down: What you eat. What skin products you use. What you drink. What you wear. Contact a doctor if: You do not get better with treatment. You get worse. You have signs of infection. You have a fever. You have new symptoms. Your bone or joint near the area hurts after the skin has healed. Get help right away if: You see red streaks coming from the area. The area turns darker. You have trouble breathing. This information is not intended to replace advice given to you by your health care provider. Make sure you discuss any questions you have with your health care provider. Document Revised: 09/05/2021 Document Reviewed: 09/05/2021 Elsevier Patient Education  2024 [employer].

## 2024-01-03 NOTE — Progress Notes (Signed)
 [name] [name] 38 y.o.   Chief Complaint  Patient presents with   Rash    States not itchy but has patches on right arm, couple on back of right shoulder, left leg, left knee. Noticed about a month ago. Has tried coconut oil and body oils to stay moisturized. No fever.     HISTORY OF PRESENT ILLNESS: Acute problem visit today. This is a 38 y.o. female complaining of nonspecific rash on several parts of her body that started about 3 to 4 weeks ago No associated symptoms No other complaints or medical concerns today.  Rash Pertinent negatives include no congestion, cough, fever, sore throat or vomiting.     Prior to Admission medications   Medication Sig Start Date End Date Taking? Authorizing Provider  albuterol  (VENTOLIN  HFA) 108 (90 Base) MCG/ACT inhaler Inhale 1-2 puffs into the lungs every 6 (six) hours as needed for wheezing or shortness of breath. 01/16/23  Yes Mound, Darryle BRAVO, FNP  APPLE CIDER VINEGAR PO Take by mouth.   Yes [provider]  Cholecalciferol (VITAMIN D -3 PO) Take by mouth.   Yes [provider]  cyanocobalamin  (VITAMIN B12) 1000 MCG tablet Take 1,000 mcg by mouth daily.   Yes [provider]  desonide  (DESOWEN ) 0.05 % ointment Apply 1 Application topically 2 (two) times daily as needed (mild rash flare). Okay to use on the face, neck, groin area. Do not use more than 1 week at a time. 05/26/23  Yes Luke Orlan HERO, DO  fluticasone  (FLONASE ) 50 MCG/ACT nasal spray Place 2 sprays into both nostrils daily. Patient not taking: Reported on 01/03/2024 06/20/18   Babara Greig GAILS, PA-C  levocetirizine (XYZAL) 5 MG tablet Take 5 mg by mouth daily as needed for allergies. Patient not taking: Reported on 05/26/2023    [provider]    No Known Allergies  Patient Active Problem List   Diagnosis Date Noted   Discoloration of skin of face 04/27/2023   Encounter for annual health examination 10/12/2022   Vitamin D  deficiency 10/12/2022   Pure  hypercholesterolemia 10/12/2022   COVID-19 vaccination declined 10/12/2022   Missed period 10/12/2022   Class 3 severe obesity due to excess calories with serious comorbidity and body mass index (BMI) of 40.0 to 44.9 in adult (HCC) 10/12/2022   Prediabetes 10/12/2022   Rash on lips 10/12/2022   Uterine fibroid 12/12/2021   Ptosis 11/13/2020   Insulin  resistance 09/30/2020   Wheezing 06/23/2018   Acute bronchitis 06/23/2018   S/P myomectomy 12/01/2017   Dyspnea 03/29/2017   Allergic rhinitis    Hyperthyroidism 01/29/2017    Past Medical History:  Diagnosis Date   Allergic rhinitis    Anginal pain    hx of (pt had cardiac work-up and everything was normal)   Bronchitis    Dyspnea    3 times a week   Headache    History of hiatal hernia    Hyperthyroidism    Neuromuscular disorder (HCC)    carpel tunnel left hand   Pre-diabetes    no meds   SVD (spontaneous vaginal delivery) 2004   x 1    Past Surgical History:  Procedure Laterality Date   DILATION AND CURETTAGE OF UTERUS     termination x 1   IUD REMOVAL N/A 12/12/2021   Procedure: INTRAUTERINE DEVICE (IUD) REMOVAL;  Surgeon: Rutherford Gain, MD;  Location: [facility] OR;  Service: Gynecology;  Laterality: N/A;   LAPAROTOMY N/A 12/12/2021   Procedure: EXPLORATORY LAPAROTOMY;  Surgeon: Rutherford Gain, MD;  Location: [facility] OR;  Service: Gynecology;  Laterality: N/A;   MYOMECTOMY N/A 12/01/2017   Procedure: MYOMECTOMY;  Surgeon: Rosalva Sawyer, MD;  Location: [facility] ORS;  Service: Gynecology;  Laterality: N/A;   MYOMECTOMY N/A 12/12/2021   Procedure: ABDOMINAL MYOMECTOMY;  Surgeon: Rutherford Gain, MD;  Location: [facility] OR;  Service: Gynecology;  Laterality: N/A;   SALPINGOOPHORECTOMY Left 12/12/2021   Procedure: OPEN LEFT SALPINGO OOPHORECTOMY;  Surgeon: Rutherford Gain, MD;  Location: [facility] OR;  Service: Gynecology;  Laterality: Left;   WISDOM TOOTH EXTRACTION      Social History   Socioeconomic History   Marital status:  Married    Spouse name: Adriana   Number of children: 1   Years of education: Not on file   Highest education level: Master's degree (e.g., MA, MS, MEng, MEd, MSW, MBA)  Occupational History   Not on file  Tobacco Use   Smoking status: Former    Current packs/day: 0.00    Average packs/day: 0.3 packs/day for 17.0 years (4.3 ttl pk-yrs)    Types: Cigarettes, Cigars    Start date: 10/31/2000    Quit date: 10/31/2017    Years since quitting: 6.1    Passive exposure: Never   Smokeless tobacco: Never   Tobacco comments:    Not smoked since 10/2017  Vaping Use   Vaping status: Never Used  Substance and Sexual Activity   Alcohol use: Yes    Comment: Once or twice a month   Drug use: No   Sexual activity: Yes    Birth control/protection: I.U.D.    Comment: Mirena IUD  Other Topics Concern   Not on file  Social History Narrative   Right Handed    Lives in an apartment on the second floor    Social Drivers of Health   Financial Resource Strain: Low Risk  (12/16/2022)   Overall Financial Resource Strain (CARDIA)    Difficulty of Paying Living Expenses: Not very hard  Food Insecurity: No Food Insecurity (12/16/2022)   Hunger Vital Sign    Worried About Running Out of Food in the Last Year: Never true    Ran Out of Food in the Last Year: Never true  Transportation Needs: No Transportation Needs (12/16/2022)   PRAPARE - Administrator, Civil Service (Medical): No    Lack of Transportation (Non-Medical): No  Physical Activity: Unknown (12/16/2022)   Exercise Vital Sign    Days of Exercise per Week: 0 days    Minutes of Exercise per Session: Not on file  Stress: Stress Concern Present (12/16/2022)   [employer] of Occupational Health - Occupational Stress Questionnaire    Feeling of Stress : To some extent  Social Connections: Moderately Integrated (12/16/2022)   Social Connection and Isolation Panel    Frequency of Communication with Friends and Family: More than three  times a week    Frequency of Social Gatherings with Friends and Family: Once a week    Attends Religious Services: More than 4 times per year    Active Member of [employer] or Organizations: No    Attends Engineer, structural: Not on file    Marital Status: Married  Catering manager Violence: Not on file    Family History  Problem Relation Age of Onset   Heart disease Father    Thyroid  disease Neg Hx      Review of Systems  Constitutional: Negative.  Negative for chills and fever.  HENT: Negative.  Negative for congestion and sore throat.   Respiratory: Negative.  Negative for cough and wheezing.   Cardiovascular: Negative.  Negative for chest pain and palpitations.  Gastrointestinal:  Negative for abdominal pain, nausea and vomiting.  Genitourinary: Negative.  Negative for dysuria and hematuria.  Skin:  Positive for rash.  Neurological: Negative.  Negative for dizziness and headaches.  All other systems reviewed and are negative.   Vitals:   01/03/24 0820  BP: 128/86  Pulse: 73  Temp: 98.4 F (36.9 C)  SpO2: 96%    Physical Exam Vitals reviewed.  Constitutional:      Appearance: Normal appearance.  HENT:     Head: Normocephalic.  Eyes:     Extraocular Movements: Extraocular movements intact.  Cardiovascular:     Rate and Rhythm: Normal rate.  Pulmonary:     Effort: Pulmonary effort is normal.  Skin:    General: Skin is warm and dry.     Findings: Rash present.     Comments: Small widespread lesions without erythema or signs of infection.  Benign appearing. Mostly on extremities  Neurological:     Mental Status: She is alert and oriented to person, place, and time.  Psychiatric:        Mood and Affect: Mood normal.        Behavior: Behavior normal.      ASSESSMENT & PLAN: I personally spent a total of 30 minutes minutes in the care of the patient today including preparing to see the patient, getting/reviewing separately obtained history, performing  a medically appropriate exam/evaluation, counseling and educating, placing orders, documenting clinical information in the EHR, coordinating care, and prognosis, management, and need for follow-up with dermatologist.  Problem List Items Addressed This Visit       Musculoskeletal and Integument   Rash and nonspecific skin eruption - Primary   Clinically stable.  No red flag signs or symptoms. Differential diagnosis discussed with patient Unknown trigger.  Not infectious rash. Patient requesting blood work Patient has dermatologist who she saw 2 months ago.  Advised to follow-up with them regarding this new rash Advised to contact the office if no better or worse during the next several days      Relevant Orders   CBC with Differential/Platelet   Comprehensive metabolic panel with GFR   Hemoglobin A1c   Vitamin B12   VITAMIN D  25 Hydroxy (Vit-D Deficiency, Fractures)   Lipid panel   TSH   Patient Instructions  Contact Dermatitis Dermatitis is when your skin becomes red, sore, and swollen.  Contact dermatitis happens when your body reacts to something that touches the skin. There are 2 types: Irritant contact dermatitis. This is when something bothers your skin, like soap. Allergic contact dermatitis. This is when your skin touches something you are allergic to, like poison ivy. What are the causes? Irritant contact dermatitis may be caused by: Makeup. Soaps. Detergents. Bleaches. Acids. Metals, like nickel. Allergic contact dermatitis may be caused by: Plants. Chemicals. Jewelry. Latex. Medicines. Preservatives. These are things added to products to help them last longer. There may be some in your clothes. What increases the risk? Having a job where you have to be near things that bother your skin. Having asthma or eczema. What are the signs or symptoms?  Dry or flaky skin. Redness. Cracks. Itching. Moderate symptoms of this condition include: Pain or a burning  feeling. Blisters. Blood or clear fluid coming from cracks in your skin. Swelling. This may be on your eyelids, mouth,  or genitals. How is this treated? Your doctor will find out what is making your skin react. Then, you can protect your skin. You may need to use: Steroid creams, ointments, or medicines. Antibiotics or other ointments, if you have a skin infection. Lotion or medicines to help with itching. A bandage. Follow these instructions at home: Skin care Put moisturizer on your skin when it needs it. Put cool, wet cloths on your skin (cool compresses). Put a baking soda paste on your skin. Stir water  into baking soda until it looks like a paste. Do not scratch your skin. Try not to have things rub up against your skin. Avoid tight clothing. Avoid using soaps, perfumes, and dyes. Check your skin every day for signs of infection. Check for: More redness, swelling, or pain. More fluid or blood. Warmth. Pus or a bad smell. Medicines Take or apply over-the-counter and prescription medicines only as told by your doctor. If you were prescribed antibiotics, take or apply them as told by your doctor. Do not stop using them even if you start to feel better. Bathing Take a bath with: Epsom salts. Baking soda. Colloidal oatmeal. Bathe less often. Bathe in warm water . Try not to use hot water . Bandage care If you were given a bandage, change it as told by your doctor. Wash your hands with soap and water  for at least 20 seconds before and after you change your bandage. If you cannot use soap and water , use hand sanitizer. General instructions Avoid the things that caused your reaction. If you don't know what caused it, keep a journal. Write down: What you eat. What skin products you use. What you drink. What you wear. Contact a doctor if: You do not get better with treatment. You get worse. You have signs of infection. You have a fever. You have new symptoms. Your bone or  joint near the area hurts after the skin has healed. Get help right away if: You see red streaks coming from the area. The area turns darker. You have trouble breathing. This information is not intended to replace advice given to you by your health care provider. Make sure you discuss any questions you have with your health care provider. Document Revised: 09/05/2021 Document Reviewed: 09/05/2021 Elsevier Patient Education  2024 [employer]vier Inc[employer]   Emil Schaumann, MD [facility]

## 2024-01-03 NOTE — Assessment & Plan Note (Signed)
 Clinically stable.  No red flag signs or symptoms. Differential diagnosis discussed with patient Unknown trigger.  Not infectious rash. Patient requesting blood work Patient has dermatologist who she saw 2 months ago.  Advised to follow-up with them regarding this new rash Advised to contact the office if no better or worse during the next several days
